# Patient Record
Sex: Male | Born: 1990 | Race: White | Hispanic: No | Marital: Single | Smoking: Current every day smoker
Health system: Southern US, Community
[De-identification: ages and names within clinical notes are randomized; demographics above are authoritative.]

## PROBLEM LIST (undated history)

## (undated) DIAGNOSIS — F410 Panic disorder [episodic paroxysmal anxiety] without agoraphobia: Secondary | ICD-10-CM

## (undated) DIAGNOSIS — F32A Depression, unspecified: Secondary | ICD-10-CM

## (undated) DIAGNOSIS — I10 Essential (primary) hypertension: Secondary | ICD-10-CM

## (undated) DIAGNOSIS — T7840XA Allergy, unspecified, initial encounter: Secondary | ICD-10-CM

## (undated) DIAGNOSIS — K219 Gastro-esophageal reflux disease without esophagitis: Secondary | ICD-10-CM

## (undated) HISTORY — DX: Allergy, unspecified, initial encounter: T78.40XA

## (undated) HISTORY — DX: Gastro-esophageal reflux disease without esophagitis: K21.9

## (undated) HISTORY — PX: FRACTURE SURGERY: SHX138

## (undated) HISTORY — DX: Depression, unspecified: F32.A

---

## 2010-12-29 ENCOUNTER — Emergency Department: Payer: Self-pay | Admitting: Emergency Medicine

## 2011-03-06 ENCOUNTER — Emergency Department: Payer: Self-pay | Admitting: Emergency Medicine

## 2011-04-02 ENCOUNTER — Emergency Department: Payer: Self-pay | Admitting: Emergency Medicine

## 2013-09-09 ENCOUNTER — Emergency Department: Payer: Self-pay | Admitting: Emergency Medicine

## 2013-09-09 LAB — URINALYSIS, COMPLETE
Bacteria: NONE SEEN
Bilirubin,UR: NEGATIVE
Blood: NEGATIVE
Glucose,UR: NEGATIVE mg/dL (ref 0–75)
Ketone: NEGATIVE
Leukocyte Esterase: NEGATIVE
Nitrite: NEGATIVE
Ph: 7 (ref 4.5–8.0)
Protein: NEGATIVE
RBC,UR: 4 /HPF (ref 0–5)
Specific Gravity: 1.014 (ref 1.003–1.030)
Squamous Epithelial: NONE SEEN
WBC UR: 12 /HPF (ref 0–5)

## 2013-09-09 LAB — CBC WITH DIFFERENTIAL/PLATELET
Basophil #: 0.1 10*3/uL (ref 0.0–0.1)
Basophil %: 0.9 %
Eosinophil #: 0.3 10*3/uL (ref 0.0–0.7)
Eosinophil %: 4.4 %
HCT: 43.4 % (ref 40.0–52.0)
HGB: 14.6 g/dL (ref 13.0–18.0)
Lymphocyte #: 2 10*3/uL (ref 1.0–3.6)
Lymphocyte %: 27.8 %
MCH: 28.9 pg (ref 26.0–34.0)
MCHC: 33.7 g/dL (ref 32.0–36.0)
MCV: 86 fL (ref 80–100)
Monocyte #: 1.3 x10 3/mm — ABNORMAL HIGH (ref 0.2–1.0)
Monocyte %: 17.2 %
Neutrophil #: 3.6 10*3/uL (ref 1.4–6.5)
Neutrophil %: 49.7 %
Platelet: 296 10*3/uL (ref 150–440)
RBC: 5.05 10*6/uL (ref 4.40–5.90)
RDW: 13.4 % (ref 11.5–14.5)
WBC: 7.3 10*3/uL (ref 3.8–10.6)

## 2013-09-09 LAB — COMPREHENSIVE METABOLIC PANEL
Albumin: 3.8 g/dL (ref 3.4–5.0)
Alkaline Phosphatase: 115 U/L
Anion Gap: 3 — ABNORMAL LOW (ref 7–16)
BUN: 14 mg/dL (ref 7–18)
Bilirubin,Total: 0.3 mg/dL (ref 0.2–1.0)
Calcium, Total: 9 mg/dL (ref 8.5–10.1)
Chloride: 102 mmol/L (ref 98–107)
Co2: 32 mmol/L (ref 21–32)
Creatinine: 1.06 mg/dL (ref 0.60–1.30)
EGFR (African American): >60
EGFR (Non-African Amer.): >60
Glucose: 101 mg/dL — ABNORMAL HIGH (ref 65–99)
Osmolality: 274 (ref 275–301)
Potassium: 3.9 mmol/L (ref 3.5–5.1)
SGOT(AST): 27 U/L (ref 15–37)
SGPT (ALT): 41 U/L (ref 12–78)
Sodium: 137 mmol/L (ref 136–145)
Total Protein: 7.8 g/dL (ref 6.4–8.2)

## 2013-09-09 LAB — LIPASE, BLOOD: Lipase: 154 U/L (ref 73–393)

## 2013-09-09 LAB — TROPONIN I: Troponin-I: <0.02 ng/mL

## 2014-06-05 ENCOUNTER — Emergency Department: Payer: Self-pay | Admitting: Emergency Medicine

## 2014-06-05 LAB — BASIC METABOLIC PANEL
Anion Gap: 7 (ref 7–16)
BUN: 16 mg/dL (ref 7–18)
Calcium, Total: 8.7 mg/dL (ref 8.5–10.1)
Chloride: 106 mmol/L (ref 98–107)
Co2: 29 mmol/L (ref 21–32)
Creatinine: 1.23 mg/dL (ref 0.60–1.30)
EGFR (African American): >60
EGFR (Non-African Amer.): >60
Glucose: 106 mg/dL — ABNORMAL HIGH (ref 65–99)
Osmolality: 285 (ref 275–301)
Potassium: 3.8 mmol/L (ref 3.5–5.1)
Sodium: 142 mmol/L (ref 136–145)

## 2014-06-05 LAB — CBC
HCT: 42.3 % (ref 40.0–52.0)
HGB: 13.9 g/dL (ref 13.0–18.0)
MCH: 29 pg (ref 26.0–34.0)
MCHC: 32.9 g/dL (ref 32.0–36.0)
MCV: 88 fL (ref 80–100)
Platelet: 269 10*3/uL (ref 150–440)
RBC: 4.81 10*6/uL (ref 4.40–5.90)
RDW: 13.3 % (ref 11.5–14.5)
WBC: 6.6 10*3/uL (ref 3.8–10.6)

## 2014-06-05 LAB — HEPATIC FUNCTION PANEL A (ARMC)
Albumin: 3.8 g/dL (ref 3.4–5.0)
Alkaline Phosphatase: 94 U/L
Bilirubin, Direct: <0.1 mg/dL (ref 0.0–0.2)
Bilirubin,Total: 0.2 mg/dL (ref 0.2–1.0)
SGOT(AST): 35 U/L (ref 15–37)
SGPT (ALT): 40 U/L
Total Protein: 7.3 g/dL (ref 6.4–8.2)

## 2014-06-05 LAB — TROPONIN I: Troponin-I: <0.02 ng/mL

## 2015-01-31 ENCOUNTER — Emergency Department: Payer: Self-pay

## 2015-01-31 ENCOUNTER — Encounter: Payer: Self-pay | Admitting: *Deleted

## 2015-01-31 ENCOUNTER — Emergency Department
Admission: EM | Admit: 2015-01-31 | Discharge: 2015-01-31 | Disposition: A | Discharge status: Home / Self Care | Payer: Self-pay | Attending: Emergency Medicine | Admitting: Emergency Medicine

## 2015-01-31 ENCOUNTER — Other Ambulatory Visit: Payer: Self-pay

## 2015-01-31 DIAGNOSIS — R42 Dizziness and giddiness: Secondary | ICD-10-CM | POA: Insufficient documentation

## 2015-01-31 DIAGNOSIS — I10 Essential (primary) hypertension: Secondary | ICD-10-CM | POA: Insufficient documentation

## 2015-01-31 DIAGNOSIS — Z720 Tobacco use: Secondary | ICD-10-CM | POA: Insufficient documentation

## 2015-01-31 HISTORY — DX: Essential (primary) hypertension: I10

## 2015-01-31 LAB — BASIC METABOLIC PANEL
Anion gap: 6 (ref 5–15)
BUN: 17 mg/dL (ref 6–20)
CO2: 28 mmol/L (ref 22–32)
Calcium: 8.9 mg/dL (ref 8.9–10.3)
Chloride: 104 mmol/L (ref 101–111)
Creatinine, Ser: 1 mg/dL (ref 0.61–1.24)
GFR calc Af Amer: >60 mL/min (ref >60)
GFR calc non Af Amer: >60 mL/min (ref >60)
Glucose, Bld: 108 mg/dL — ABNORMAL HIGH (ref 65–99)
Potassium: 3.6 mmol/L (ref 3.5–5.1)
Sodium: 138 mmol/L (ref 135–145)

## 2015-01-31 LAB — CBC
HCT: 41.2 % (ref 40.0–52.0)
Hemoglobin: 14 g/dL (ref 13.0–18.0)
MCH: 28.9 pg (ref 26.0–34.0)
MCHC: 33.9 g/dL (ref 32.0–36.0)
MCV: 85.3 fL (ref 80.0–100.0)
Platelets: 296 10*3/uL (ref 150–440)
RBC: 4.83 MIL/uL (ref 4.40–5.90)
RDW: 13.2 % (ref 11.5–14.5)
WBC: 6.6 10*3/uL (ref 3.8–10.6)

## 2015-01-31 LAB — TROPONIN I: Troponin I: <0.03 ng/mL (ref <0.031)

## 2015-01-31 MED ORDER — SODIUM CHLORIDE 0.9 % IV SOLN: 1000.0000 mL | Freq: Once | INTRAVENOUS | Status: DC

## 2015-01-31 NOTE — ED Notes (Addendum)
Patient states he became dizzy approximately two hours ago and left-sided chest pain began one hour ago which he described as a pressure. Patient states he has been diaphoretic today and feels like he is dehydrated. Patient states he has been having dizzy episodes lately without the chest pain.

## 2015-01-31 NOTE — Discharge Instructions (Signed)

## 2015-01-31 NOTE — ED Provider Notes (Signed)
[facility] Emergency Department Provider Note  ____________________________________________  Time seen: 3:45 PM  I have reviewed the triage vital signs and the nursing notes.   HISTORY  Chief Complaint Dizziness   HPI Zachary L Pore Montez Hageman. is a 24 y.o. male who presents with complaints of dizziness. He reports he has been working outside in the heat very high acuity over the last 2 weeks. Today he felt significantly dizzy and dehydrated. On his way to the emergency department he felt a little bit of tightness in his chest. But is now resolved. No shortness of breath. No fevers no chills. No muscle aches. He feels well while lying in bed     Past Medical History  Diagnosis Date  . Hypertension     There are no active problems to display for this patient.   History reviewed. No pertinent past surgical history.  No current outpatient prescriptions on file.  Allergies Review of patient's allergies indicates no known allergies.  No family history on file.  Social History Social History  Substance Use Topics  . Smoking status: Current Every Day Smoker  . Smokeless tobacco: None  . Alcohol Use: Yes     Comment: occasionally    Review of Systems  Constitutional: Negative for fever. Eyes: Negative for visual changes. ENT: Negative for sore throat Cardiovascular: Positive for chest tightness Respiratory: Negative for shortness of breath. Gastrointestinal: Negative for abdominal pain, vomiting and diarrhea. Genitourinary: Negative for dysuria. Musculoskeletal: Negative for back pain. Skin: Negative for rash. Neurological: Negative for headaches or focal weakness. Positive for dizziness Psychiatric: No anxiety    ____________________________________________   PHYSICAL EXAM:  VITAL SIGNS: ED Triage Vitals  Enc Vitals Group     BP 01/31/15 1530 141/88 mmHg     Pulse Rate 01/31/15 1530 91     Resp 01/31/15 1530 18     Temp 01/31/15 1530  98 F (36.7 C)     Temp Source 01/31/15 1530 Oral     SpO2 01/31/15 1530 99 %     Weight 01/31/15 1530 283 lb (128.368 kg)     Height 01/31/15 1530  (1.803 m)     Head Cir --      Peak Flow --      Pain Score 01/31/15 1533 2     Pain Loc --      Pain Edu? --      Excl. in GC? --      Constitutional: Alert and oriented. Well appearing and in no distress. Eyes: Conjunctivae are normal.  ENT   Head: Normocephalic and atraumatic.   Mouth/Throat: Mucous membranes are moist. Cardiovascular: Normal rate, regular rhythm. Normal and symmetric distal pulses are present in all extremities. No murmurs, rubs, or gallops. Respiratory: Normal respiratory effort without tachypnea nor retractions. Breath sounds are clear and equal bilaterally.  Gastrointestinal: Soft and non-tender in all quadrants. No distention. There is no CVA tenderness. Genitourinary: deferred Musculoskeletal: Nontender with normal range of motion in all extremities. No lower extremity tenderness nor edema. Neurologic:  Normal speech and language. No gross focal neurologic deficits are appreciated. Skin:  Skin is warm, dry and intact. No rash noted. Psychiatric: Mood and affect are normal. Patient exhibits appropriate insight and judgment.  ____________________________________________    LABS (pertinent positives/negatives)  Labs Reviewed  BASIC METABOLIC PANEL - Abnormal; Notable for the following:    Glucose, Bld 108 (*)    All other components within normal limits  CBC  TROPONIN I  ____________________________________________   EKG  ED ECG REPORT I, Jene Every, the attending physician, personally viewed and interpreted this ECG.  Date: 01/31/2015 EKG Time: 3:29 PM Rate: 86 Rhythm: normal sinus rhythm QRS Axis: normal Intervals: normal ST/T Wave abnormalities: normal Conduction Disutrbances: none Narrative Interpretation:  unremarkable   ____________________________________________    RADIOLOGY I have personally reviewed any xrays that were ordered on this patient: Chest x-ray unremarkable  ____________________________________________   PROCEDURES  Procedure(s) performed: none  Critical Care performed: none  ____________________________________________   INITIAL IMPRESSION / ASSESSMENT AND PLAN / ED COURSE  Pertinent labs & imaging results that were available during my care of the patient were reviewed by me and considered in my medical decision making (see chart for details).  Patient overall well-appearing. He has no chest pain in the emergency department. His EKG and his troponin are normal. His labs unremarkable. Clinically I think he is suffering from heat exhaustion. We'll give him a liter of fluid in the emergency department and see how he feels  After fluids patient reports he no longer has any dizziness at all. He is able to ambulate around the room without difficulty. His labs are unremarkable. We will discharge him with PCP follow-up and encouraged hydration  ____________________________________________   FINAL CLINICAL IMPRESSION(S) / ED DIAGNOSES  Final diagnoses:  Dizziness     Jene Every, MD 01/31/15 [phone]

## 2015-02-09 ENCOUNTER — Encounter: Payer: Self-pay | Admitting: *Deleted

## 2015-02-09 ENCOUNTER — Emergency Department
Admission: EM | Admit: 2015-02-09 | Discharge: 2015-02-09 | Disposition: A | Discharge status: Home / Self Care | Payer: No Typology Code available for payment source | Attending: Emergency Medicine | Admitting: Emergency Medicine

## 2015-02-09 DIAGNOSIS — L509 Urticaria, unspecified: Secondary | ICD-10-CM | POA: Insufficient documentation

## 2015-02-09 DIAGNOSIS — R21 Rash and other nonspecific skin eruption: Secondary | ICD-10-CM | POA: Diagnosis present

## 2015-02-09 DIAGNOSIS — I10 Essential (primary) hypertension: Secondary | ICD-10-CM | POA: Insufficient documentation

## 2015-02-09 DIAGNOSIS — Z720 Tobacco use: Secondary | ICD-10-CM | POA: Insufficient documentation

## 2015-02-09 MED ORDER — PREDNISONE 10 MG PO TABS: 10.0000 mg | ORAL_TABLET | Freq: Two times a day (BID) | ORAL | Status: DC

## 2015-02-09 MED ORDER — CYPROHEPTADINE HCL 4 MG PO TABS: 4.0000 mg | ORAL_TABLET | Freq: Three times a day (TID) | ORAL | Status: DC | PRN

## 2015-02-09 MED ORDER — PREDNISONE 20 MG PO TABS
60.0000 mg | ORAL_TABLET | Freq: Once | ORAL | Status: AC
  Administered 2015-02-09: 60 mg via ORAL
  Filled 2015-02-09: qty 3

## 2015-02-09 MED ORDER — FAMOTIDINE 20 MG PO TABS
40.0000 mg | ORAL_TABLET | Freq: Once | ORAL | Status: AC
  Administered 2015-02-09: 40 mg via ORAL
  Filled 2015-02-09: qty 2

## 2015-02-09 MED ORDER — RANITIDINE HCL 150 MG PO TABS: 150.0000 mg | ORAL_TABLET | Freq: Two times a day (BID) | ORAL | Status: DC

## 2015-02-09 NOTE — ED Notes (Signed)
Pt reports hives on bilateral arms , chest and legs, pt reports wife switched to [employer] detergent

## 2015-02-09 NOTE — ED Provider Notes (Signed)
[facility] Emergency Department Provider Note ____________________________________________  Time seen: 1320  I have reviewed the triage vital signs and the nursing notes.  HISTORY  Chief Complaint  Urticaria  HPI Zachary L Diiorio Montez Hageman. is a 24 y.o. male reports to the ED for generalized itchy rash of unknown cause. He describes he took Benadryl last night with some short-term relief. He is on clear the cause of the rash itself. He describes no change in his oral intake, no exposuresto any allergens or triggers. He denies any history of sensitive skin or food or environmental allergies. He is denying any respiratory symptoms, mucous membrane swelling, or difficulty breathing.  Past Medical History  Diagnosis Date  . Hypertension    There are no active problems to display for this patient.  History reviewed. No pertinent past surgical history.  Current Outpatient Rx  Name  Route  Sig  Dispense  Refill  . cyproheptadine (PERIACTIN) 4 MG tablet   Oral   Take 1 tablet (4 mg total) by mouth 3 (three) times daily as needed for allergies.   21 tablet   0   . predniSONE (DELTASONE) 10 MG tablet   Oral   Take 1 tablet (10 mg total) by mouth 2 (two) times daily with a meal.   10 tablet   0   . ranitidine (ZANTAC) 150 MG tablet   Oral   Take 1 tablet (150 mg total) by mouth 2 (two) times daily.   20 tablet   0     Allergies Review of patient's allergies indicates no known allergies.  No family history on file.  Social History Social History  Substance Use Topics  . Smoking status: Current Every Day Smoker -- 1.00 packs/day    Types: Cigarettes  . Smokeless tobacco: None  . Alcohol Use: Yes     Comment: occasionally   Review of Systems  Constitutional: Negative for fever. Eyes: Negative for visual changes. ENT: Negative for sore throat. Cardiovascular: Negative for chest pain. Respiratory: Negative for shortness of breath. Gastrointestinal:  Negative for abdominal pain, vomiting and diarrhea. Genitourinary: Negative for dysuria. Musculoskeletal: Negative for back pain. Skin: Positive for rash. Neurological: Negative for headaches, focal weakness or numbness. ____________________________________________  PHYSICAL EXAM:  VITAL SIGNS: ED Triage Vitals  Enc Vitals Group     BP 02/09/15 1256 148/92 mmHg     Pulse Rate 02/09/15 1256 90     Resp 02/09/15 1256 20     Temp 02/09/15 1256 97.9 F (36.6 C)     Temp Source 02/09/15 1256 Oral     SpO2 02/09/15 1256 97 %     Weight 02/09/15 1256 230 lb (104.327 kg)     Height 02/09/15 1256  (1.803 m)     Head Cir --      Peak Flow --      Pain Score --      Pain Loc --      Pain Edu? --      Excl. in GC? --    Constitutional: Alert and oriented. Well appearing and in no distress. Eyes: Conjunctivae are normal. PERRL. Normal extraocular movements. ENT   Head: Normocephalic and atraumatic.   Nose: No congestion/rhinnorhea.   Mouth/Throat: Mucous membranes are moist. Uvula midline without evidence of angioedema.  Neck: Supple. No thyromegaly. Hematological/Lymphatic/Immunilogical: No cervical lymphadenopathy. Cardiovascular: Normal rate, regular rhythm.  Respiratory: Normal respiratory effort. No wheezes/rales/rhonchi. Gastrointestinal: Soft and nontender. No distention. Musculoskeletal: Nontender with normal range of motion  in all extremities.  Neurologic:  Normal gait without ataxia. Normal speech and language. No gross focal neurologic deficits are appreciated. Skin:  Skin is warm, dry and intact. Scattered maculopapular eruptions consistent with whelps of unknown cause.  Psychiatric: Mood and affect are normal. Patient exhibits appropriate insight and judgment. ____________________________________________  PROCEDURES  Famotidine 20 mg PO Prednisone 60 mg PO ____________________________________________  INITIAL IMPRESSION / ASSESSMENT AND PLAN / ED  COURSE  Urticaria of unknown etiology. Will treat with histamine blockade with Periactin, ranitidine, and prednisone 10 mg #10. Follow-up with primary provider or return as needed. ____________________________________________  FINAL CLINICAL IMPRESSION(S) / ED DIAGNOSES  Final diagnoses:  Urticaria     Zachary Hoard, PA-C 02/09/15 1411  Minna Antis, MD 02/09/15 [phone]

## 2015-02-09 NOTE — Discharge Instructions (Signed)
Hives Hives are itchy, red, swollen areas of the skin. They can vary in size and location on your body. Hives can come and go for hours or several days (acute hives) or for several weeks (chronic hives). Hives do not spread from person to person (noncontagious). They may get worse with scratching, exercise, and emotional stress. CAUSES   Allergic reaction to food, additives, or drugs.  Infections, including the common cold.  Illness, such as vasculitis, lupus, or thyroid disease.  Exposure to sunlight, heat, or cold.  Exercise.  Stress.  Contact with chemicals. SYMPTOMS   Red or white swollen patches on the skin. The patches may change size, shape, and location quickly and repeatedly.  Itching.  Swelling of the hands, feet, and face. This may occur if hives develop deeper in the skin. DIAGNOSIS  Your caregiver can usually tell what is wrong by performing a physical exam. Skin or blood tests may also be done to determine the cause of your hives. In some cases, the cause cannot be determined. TREATMENT  Mild cases usually get better with medicines such as antihistamines. Severe cases may require an emergency epinephrine injection. If the cause of your hives is known, treatment includes avoiding that trigger.  HOME CARE INSTRUCTIONS   Avoid causes that trigger your hives.  Take antihistamines as directed by your caregiver to reduce the severity of your hives. Non-sedating or low-sedating antihistamines are usually recommended. Do not drive while taking an antihistamine.  Take any other medicines prescribed for itching as directed by your caregiver.  Wear loose-fitting clothing.  Keep all follow-up appointments as directed by your caregiver. SEEK MEDICAL CARE IF:   You have persistent or severe itching that is not relieved with medicine.  You have painful or swollen joints. SEEK IMMEDIATE MEDICAL CARE IF:   You have a fever.  Your tongue or lips are swollen.  You have  trouble breathing or swallowing.  You feel tightness in the throat or chest.  You have abdominal pain. These problems may be the first sign of a life-threatening allergic reaction. Call your local emergency services (911 in [place] MAKE SURE YOU:   Understand these instructions.  Will watch your condition.  Will get help right away if you are not doing well or get worse. Document Released: 05/31/2005 Document Revised: 06/05/2013 Document Reviewed: 08/24/2011 [facility] Patient Information 2015 [place], [place]. This information is not intended to replace advice given to you by your health care provider. Make sure you discuss any questions you have with your health care provider.  You are having an allergic reaction to an unknown cause. Take the prescription meds as directed. Follow-up with your provider, [facility], or return to the ED as needed.

## 2015-02-09 NOTE — ED Notes (Signed)
Here for generalized rash, pt took benadryl last PM.  Unknown cause of rash.

## 2016-01-13 ENCOUNTER — Emergency Department: Payer: No Typology Code available for payment source

## 2016-01-13 ENCOUNTER — Emergency Department
Admission: EM | Admit: 2016-01-13 | Discharge: 2016-01-13 | Disposition: A | Discharge status: Home / Self Care | Payer: No Typology Code available for payment source | Attending: Emergency Medicine | Admitting: Emergency Medicine

## 2016-01-13 ENCOUNTER — Encounter: Payer: Self-pay | Admitting: Emergency Medicine

## 2016-01-13 DIAGNOSIS — F17210 Nicotine dependence, cigarettes, uncomplicated: Secondary | ICD-10-CM | POA: Diagnosis not present

## 2016-01-13 DIAGNOSIS — R1011 Right upper quadrant pain: Secondary | ICD-10-CM | POA: Diagnosis present

## 2016-01-13 DIAGNOSIS — R112 Nausea with vomiting, unspecified: Secondary | ICD-10-CM | POA: Diagnosis not present

## 2016-01-13 DIAGNOSIS — R1013 Epigastric pain: Secondary | ICD-10-CM | POA: Diagnosis not present

## 2016-01-13 DIAGNOSIS — I10 Essential (primary) hypertension: Secondary | ICD-10-CM | POA: Diagnosis not present

## 2016-01-13 DIAGNOSIS — R197 Diarrhea, unspecified: Secondary | ICD-10-CM | POA: Insufficient documentation

## 2016-01-13 DIAGNOSIS — R109 Unspecified abdominal pain: Secondary | ICD-10-CM

## 2016-01-13 LAB — COMPREHENSIVE METABOLIC PANEL
ALT: 31 U/L (ref 17–63)
AST: 26 U/L (ref 15–41)
Albumin: 4.6 g/dL (ref 3.5–5.0)
Alkaline Phosphatase: 82 U/L (ref 38–126)
Anion gap: 8 (ref 5–15)
BUN: 18 mg/dL (ref 6–20)
CO2: 25 mmol/L (ref 22–32)
Calcium: 9.5 mg/dL (ref 8.9–10.3)
Chloride: 104 mmol/L (ref 101–111)
Creatinine, Ser: 0.94 mg/dL (ref 0.61–1.24)
GFR calc Af Amer: >60 mL/min (ref >60)
GFR calc non Af Amer: >60 mL/min (ref >60)
Glucose, Bld: 112 mg/dL — ABNORMAL HIGH (ref 65–99)
Potassium: 4 mmol/L (ref 3.5–5.1)
Sodium: 137 mmol/L (ref 135–145)
Total Bilirubin: 0.6 mg/dL (ref 0.3–1.2)
Total Protein: 7.7 g/dL (ref 6.5–8.1)

## 2016-01-13 LAB — CBC WITH DIFFERENTIAL/PLATELET
Basophils Absolute: 0 10*3/uL (ref 0–0.1)
Basophils Relative: 0 %
Eosinophils Absolute: 0.3 10*3/uL (ref 0–0.7)
Eosinophils Relative: 3 %
HCT: 44.2 % (ref 40.0–52.0)
Hemoglobin: 15.5 g/dL (ref 13.0–18.0)
Lymphocytes Relative: 12 %
Lymphs Abs: 1.3 10*3/uL (ref 1.0–3.6)
MCH: 29.5 pg (ref 26.0–34.0)
MCHC: 35 g/dL (ref 32.0–36.0)
MCV: 84.4 fL (ref 80.0–100.0)
Monocytes Absolute: 1.2 10*3/uL — ABNORMAL HIGH (ref 0.2–1.0)
Monocytes Relative: 11 %
Neutro Abs: 8.3 10*3/uL — ABNORMAL HIGH (ref 1.4–6.5)
Neutrophils Relative %: 74 %
Platelets: 282 10*3/uL (ref 150–440)
RBC: 5.24 MIL/uL (ref 4.40–5.90)
RDW: 13.3 % (ref 11.5–14.5)
WBC: 11.1 10*3/uL — ABNORMAL HIGH (ref 3.8–10.6)

## 2016-01-13 LAB — URINALYSIS COMPLETE WITH MICROSCOPIC (ARMC ONLY)
Bacteria, UA: NONE SEEN
Bilirubin Urine: NEGATIVE
Glucose, UA: NEGATIVE mg/dL
Hgb urine dipstick: NEGATIVE
Ketones, ur: NEGATIVE mg/dL
Leukocytes, UA: NEGATIVE
Nitrite: NEGATIVE
Protein, ur: NEGATIVE mg/dL
RBC / HPF: NONE SEEN RBC/hpf (ref 0–5)
Specific Gravity, Urine: 1.02 (ref 1.005–1.030)
Squamous Epithelial / LPF: NONE SEEN
pH: 5 (ref 5.0–8.0)

## 2016-01-13 LAB — LIPASE, BLOOD: Lipase: 38 U/L (ref 11–51)

## 2016-01-13 MED ORDER — HYDROMORPHONE HCL 1 MG/ML IJ SOLN
INTRAMUSCULAR | Status: AC
  Filled 2016-01-13: qty 1

## 2016-01-13 MED ORDER — GI COCKTAIL ~~LOC~~
ORAL | Status: AC
  Administered 2016-01-13: 30 mL via ORAL
  Filled 2016-01-13: qty 30

## 2016-01-13 MED ORDER — GI COCKTAIL ~~LOC~~
30.0000 mL | Freq: Once | ORAL | Status: AC
  Administered 2016-01-13: 30 mL via ORAL

## 2016-01-13 MED ORDER — DIATRIZOATE MEGLUMINE & SODIUM 66-10 % PO SOLN
15.0000 mL | Freq: Once | ORAL | Status: AC
  Administered 2016-01-13: 15 mL via ORAL

## 2016-01-13 MED ORDER — ONDANSETRON HCL 4 MG/2ML IJ SOLN
4.0000 mg | Freq: Once | INTRAMUSCULAR | Status: AC
  Administered 2016-01-13: 4 mg via INTRAVENOUS
  Filled 2016-01-13: qty 2

## 2016-01-13 MED ORDER — SODIUM CHLORIDE 0.9 % IV BOLUS (SEPSIS)
1000.0000 mL | Freq: Once | INTRAVENOUS | Status: AC
  Administered 2016-01-13: 1000 mL via INTRAVENOUS

## 2016-01-13 MED ORDER — IOPAMIDOL (ISOVUE-300) INJECTION 61%
100.0000 mL | Freq: Once | INTRAVENOUS | Status: AC | PRN
Start: 2016-01-13 — End: 2016-01-13
  Administered 2016-01-13: 100 mL via INTRAVENOUS

## 2016-01-13 MED ORDER — DICYCLOMINE HCL 10 MG PO CAPS
20.0000 mg | ORAL_CAPSULE | Freq: Once | ORAL | Status: AC
  Administered 2016-01-13: 20 mg via ORAL
  Filled 2016-01-13: qty 2

## 2016-01-13 MED ORDER — FAMOTIDINE 20 MG PO TABS
40.0000 mg | ORAL_TABLET | Freq: Once | ORAL | Status: AC
Start: 2016-01-13 — End: 2016-01-13
  Administered 2016-01-13: 40 mg via ORAL
  Filled 2016-01-13: qty 2

## 2016-01-13 MED ORDER — DICYCLOMINE HCL 20 MG PO TABS: 20.0000 mg | ORAL_TABLET | Freq: Three times a day (TID) | ORAL | 0 refills | Status: DC | PRN

## 2016-01-13 MED ORDER — HYDROCODONE-ACETAMINOPHEN 5-325 MG PO TABS: 1.0000 | ORAL_TABLET | ORAL | 0 refills | Status: DC | PRN

## 2016-01-13 MED ORDER — HYDROMORPHONE HCL 1 MG/ML IJ SOLN
1.0000 mg | Freq: Once | INTRAMUSCULAR | Status: AC
  Administered 2016-01-13: 1 mg via INTRAVENOUS

## 2016-01-13 MED ORDER — LISINOPRIL 2.5 MG PO TABS: 2.5000 mg | ORAL_TABLET | Freq: Every day | ORAL | 0 refills | Status: DC

## 2016-01-13 MED ORDER — HYDROMORPHONE HCL 1 MG/ML IJ SOLN
1.0000 mg | Freq: Once | INTRAMUSCULAR | Status: AC
  Administered 2016-01-13: 1 mg via INTRAVENOUS
  Filled 2016-01-13: qty 1

## 2016-01-13 MED ORDER — RANITIDINE HCL 150 MG PO TABS: 150.0000 mg | ORAL_TABLET | Freq: Two times a day (BID) | ORAL | 0 refills | Status: DC

## 2016-01-13 NOTE — ED Notes (Signed)
Pt transported to Ultrasound.  

## 2016-01-13 NOTE — ED Triage Notes (Signed)
Reports abd pain x 3 days, diarrhea today

## 2016-01-13 NOTE — ED Notes (Signed)
Pt returned from [place], pain 10/10. MD made aware.

## 2016-01-13 NOTE — ED Provider Notes (Signed)
[facility] Emergency Department Provider Note  ___________________________________________   None    (approximate)  I have reviewed the triage vital signs and the nursing notes.   HISTORY  Chief Complaint Abdominal Pain  HPI Zachary Mendoza. is a 25 y.o. male who is complaining of supraumbilical and right upper quadrant abdominal pain that has been going on for the past 3 days. Patient states he has also been having vomiting after eating and a small amount of diarrhea. Patient states that the pain is a crescendo decrescendo type pain, but does not appear to change or get worse with eating.patient states his pain on a scale of 0-10 is a 10 at its peak,but again it keeps coming and going," crescendo decrescendo" type pattern every few minutes.    Past Medical History:  Diagnosis Date  . Hypertension     There are no active problems to display for this patient.   History reviewed. No pertinent surgical history.  Prior to Admission medications   Medication Sig Start Date End Date Taking? Authorizing Provider  fenofibrate 54 MG tablet Take 1 tablet by mouth daily. 11/17/15  Yes Historical Provider, MD  dicyclomine (BENTYL) 20 MG tablet Take 1 tablet (20 mg total) by mouth 3 (three) times daily as needed for spasms. 01/13/16   Leona Carry, MD  HYDROcodone-acetaminophen (NORCO/VICODIN) 5-325 MG tablet Take 1 tablet by mouth every 4 (four) hours as needed for moderate pain. 01/13/16 01/12/17  Leona Carry, MD  lisinopril (ZESTRIL) 2.5 MG tablet Take 1 tablet (2.5 mg total) by mouth daily. 01/13/16 01/12/17  Leona Carry, MD  ranitidine (ZANTAC) 150 MG tablet Take 1 tablet (150 mg total) by mouth 2 (two) times daily. 01/13/16 01/12/17  Leona Carry, MD    Allergies Review of patient's allergies indicates no known allergies.  No family history on file.  Social History Social History  Substance Use Topics  . Smoking status: Current Every Day Smoker   Packs/day: 1.00    Types: Cigarettes  . Smokeless tobacco: Never Used  . Alcohol use Yes     Comment: occasionally    Review of Systems Constitutional: No fever/chills Eyes: No visual changes. ENT: No sore throat. Cardiovascular: Denies chest pain. Respiratory: Denies shortness of breath cough or congestion. Gastrointestinal: positive for abdominal pain. Positive for nausea and vomiting.  Positive for diarrhea.  No constipation. Genitourinary: Negative for dysuria, frequency, or hematuria. Musculoskeletal: Negative for back pain. pain does not seem to radiate to the back. Skin: Negative for rash. Neurological: Negative for headaches, focal weakness or numbness. 10-point ROS otherwise negative.  ____________________________________________   PHYSICAL EXAM:  VITAL SIGNS: ED Triage Vitals [01/13/16 0804]  Enc Vitals Group     BP (!) 139/91     Pulse Rate 89     Resp 20     Temp 98 F (36.7 C)     Temp Source Oral     SpO2 99 %     Weight 269 lb (122 kg)     Height 6' (1.829 m)     Head Circumference      Peak Flow      Pain Score 10     Pain Loc      Pain Edu?      Excl. in GC?     Constitutional: Alert and oriented. Well appearing and in moderate acute distress, secondary to his pain. Eyes: Conjunctivae are normal. PERRL. EOMI. Head: Atraumatic. Nose: No congestion/rhinnorhea. Mouth/Throat:  Mucous membranes are moist.  Oropharynx non-erythematous. Neck: No stridor.   Cardiovascular: Normal rate, regular rhythm. Grossly normal heart sounds.  Good peripheral circulation. Respiratory: Normal respiratory effort.  No retractions. Lungs CTAB. Gastrointestinal: Soft and tender to palpation in his epigastric and right upper quadrant region, with positive Murphy sign. No distention. No abdominal bruits. No CVA tenderness. Musculoskeletal: No lower extremity tenderness nor edema.  No joint effusions. Neurologic:  Normal speech and language. No gross focal neurologic  deficits are appreciated. No gait instability. Skin:  Skin is warm, dry and intact. No rash noted. Psychiatric: Mood and affect are normal. Speech and behavior are normal.  ____________________________________________   LABS (all labs ordered are listed, but only abnormal results are displayed)  Labs Reviewed  CBC WITH DIFFERENTIAL/PLATELET - Abnormal; Notable for the following:       Result Value   WBC 11.1 (*)    Neutro Abs 8.3 (*)    Monocytes Absolute 1.2 (*)    All other components within normal limits  COMPREHENSIVE METABOLIC PANEL - Abnormal; Notable for the following:    Glucose, Bld 112 (*)    All other components within normal limits  URINALYSIS COMPLETEWITH MICROSCOPIC ([facility] ONLY) - Abnormal; Notable for the following:    Color, Urine YELLOW (*)    APPearance CLEAR (*)    All other components within normal limits  LIPASE, BLOOD   ____________________________________________  EKG  ED ECG REPORT I, Leona Carry, the attending physician, personally viewed and interpreted this ECG.   Date: 01/13/2016  EKG Time: 0822  Rate: 74  Rhythm: normal EKG, normal sinus rhythm, unchanged from previous tracings  Axis: Normal  Intervals:none  ST&T Change: None  ____________________________________________  RADIOLOGY  Ct Abdomen Pelvis W Contrast  Result Date: 01/13/2016 CLINICAL DATA:  Three-day history of epigastric region pain with nausea and vomiting EXAM: CT ABDOMEN AND PELVIS WITH CONTRAST TECHNIQUE: Multidetector CT imaging of the abdomen and pelvis was performed using the standard protocol following bolus administration of intravenous contrast. CONTRAST:  ISOVUE-300 IOPAMIDOL (ISOVUE-300) INJECTION 61% COMPARISON:  None. FINDINGS: Lower chest: There is patchy bibasilar atelectasis posteriorly. No lung base edema or consolidation evident. Hepatobiliary: No focal liver lesions are evident. Gallbladder wall is not appreciably thickened. There is no biliary duct  dilatation. Pancreas: There is no pancreatic mass or inflammatory focus. Spleen: No splenic lesions are evident. There is a small splenule anterior to the spleen. Adrenals/Urinary Tract: Adrenals appear normal bilaterally. Kidneys bilaterally show no mass or hydronephrosis on either side. There is no renal or ureteral calculus on either side. Urinary bladder is midline with wall thickness within normal limits. Stomach/Bowel: There are multiple sigmoid diverticula without diverticulitis evident. Scattered colonic diverticula elsewhere noted without diverticulitis. There is no bowel wall or mesenteric thickening. There is no appreciable bowel obstruction. No free air or portal venous air. Vascular/Lymphatic: There is no abdominal aortic aneurysm. No vascular lesions are evident. There is no adenopathy in the abdomen or pelvis. Reproductive: Prostate and seminal vesicles appear within normal limits. There is no pelvic mass or pelvic fluid collection. Other: Appendix appears unremarkable. There is no ascites or abscess in the abdomen or pelvis. There is fat in each inguinal ring. Musculoskeletal: There are no blastic or lytic bone lesions. There is no intramuscular or abdominal wall lesion. IMPRESSION: Colonic diverticulosis without diverticulitis. No bowel wall thickening. No bowel obstruction. No abscess. Appendix appears normal. No renal or ureteral calculus.  No hydronephrosis. Electronically Signed   By: Chrissie Noa  Margarita Grizzle III M.D.   On: 01/13/2016 11:22   US Abdomen Limited Ruq  Result Date: 01/13/2016 CLINICAL DATA:  Acute right upper quadrant abdominal pain. EXAM: US ABDOMEN LIMITED - RIGHT UPPER QUADRANT COMPARISON:  None. FINDINGS: Gallbladder: No gallstones or wall thickening visualized. No sonographic Murphy sign noted by sonographer. Common bile duct: Diameter: 2.5 mm which is within normal limits. Liver: No focal lesion identified. Increased echogenicity is noted consistent with fatty infiltration.  IMPRESSION: Probable fatty infiltration of the liver. No other abnormality seen in the right upper quadrant of the abdomen. Electronically Signed   By: Lupita Raider, M.D.   On: 01/13/2016 09:31   _____________________________________   PROCEDURES  Procedure(s) performed: None  Procedures  Critical Care performed: No  ____________________________________________   INITIAL IMPRESSION / ASSESSMENT AND PLAN / ED COURSE  Pertinent labs & imaging results that were available during my care of the patient were reviewed by me and considered in my medical decision making (see chart for details).  1:38 PM Patient will be started on IV fluids along with given some IV pain and nausea medicines while awaiting labs and right upper quadrant ultrasound to rule out cholecystitis.  1:38 PM Patient's ultrasound the gallbladder was negative. Patient will get a CT abdomen pelvis to rule out causes for his abdominal pain.  1:38 PM Patient continued to have severe abdominal pain intermittent throughout the time that revealing his CT abdomen pelvis. Patient required multiple medications for pain. We finally decided causes pain was very spasmodic, to try Bentyl and Pepcid combination. That finally helped to relieve his pain. We are going to send him home on Bentyl and zantac along with a few hydrocodone to take if the pain gets worse. Patient will be referred to Dr. Servando Snare for further workup from GI. Patient also was given a prescription for his blood pressure medicines secondary to he had run out of his medications. Patient has an appointment with his primary care M.D. next week.  Clinical Course  Value Comment By Time  MCV: 84.4 (Reviewed) Leona Carry, MD 08/01 1132     ____________________________________________   FINAL CLINICAL IMPRESSION(S) / ED DIAGNOSES  Final diagnoses:  Acute abdominal pain      NEW MEDICATIONS STARTED DURING THIS VISIT:  New Prescriptions   DICYCLOMINE (BENTYL)  20 MG TABLET    Take 1 tablet (20 mg total) by mouth 3 (three) times daily as needed for spasms.   HYDROCODONE-ACETAMINOPHEN (NORCO/VICODIN) 5-325 MG TABLET    Take 1 tablet by mouth every 4 (four) hours as needed for moderate pain.   LISINOPRIL (ZESTRIL) 2.5 MG TABLET    Take 1 tablet (2.5 mg total) by mouth daily.   RANITIDINE (ZANTAC) 150 MG TABLET    Take 1 tablet (150 mg total) by mouth 2 (two) times daily.     Note:  This document was prepared using Dragon voice recognition software and may include unintentional dictation errors.    Leona Carry, MD 01/13/16 [id number]

## 2016-02-22 ENCOUNTER — Emergency Department: Payer: No Typology Code available for payment source

## 2016-02-22 ENCOUNTER — Emergency Department
Admission: EM | Admit: 2016-02-22 | Discharge: 2016-02-22 | Disposition: A | Discharge status: Home / Self Care | Payer: No Typology Code available for payment source | Attending: Emergency Medicine | Admitting: Emergency Medicine

## 2016-02-22 ENCOUNTER — Encounter: Payer: Self-pay | Admitting: Emergency Medicine

## 2016-02-22 DIAGNOSIS — F419 Anxiety disorder, unspecified: Secondary | ICD-10-CM | POA: Insufficient documentation

## 2016-02-22 DIAGNOSIS — R0789 Other chest pain: Secondary | ICD-10-CM | POA: Insufficient documentation

## 2016-02-22 DIAGNOSIS — K2970 Gastritis, unspecified, without bleeding: Secondary | ICD-10-CM | POA: Diagnosis not present

## 2016-02-22 DIAGNOSIS — I10 Essential (primary) hypertension: Secondary | ICD-10-CM | POA: Insufficient documentation

## 2016-02-22 DIAGNOSIS — F17210 Nicotine dependence, cigarettes, uncomplicated: Secondary | ICD-10-CM | POA: Insufficient documentation

## 2016-02-22 LAB — CBC
HCT: 45.6 % (ref 40.0–52.0)
Hemoglobin: 15.7 g/dL (ref 13.0–18.0)
MCH: 29.6 pg (ref 26.0–34.0)
MCHC: 34.5 g/dL (ref 32.0–36.0)
MCV: 85.7 fL (ref 80.0–100.0)
Platelets: 288 10*3/uL (ref 150–440)
RBC: 5.31 MIL/uL (ref 4.40–5.90)
RDW: 13.4 % (ref 11.5–14.5)
WBC: 8.7 10*3/uL (ref 3.8–10.6)

## 2016-02-22 LAB — BASIC METABOLIC PANEL
Anion gap: 10 (ref 5–15)
BUN: 15 mg/dL (ref 6–20)
CO2: 25 mmol/L (ref 22–32)
Calcium: 9.5 mg/dL (ref 8.9–10.3)
Chloride: 102 mmol/L (ref 101–111)
Creatinine, Ser: 0.82 mg/dL (ref 0.61–1.24)
GFR calc Af Amer: >60 mL/min (ref >60)
GFR calc non Af Amer: >60 mL/min (ref >60)
Glucose, Bld: 126 mg/dL — ABNORMAL HIGH (ref 65–99)
Potassium: 3.7 mmol/L (ref 3.5–5.1)
Sodium: 137 mmol/L (ref 135–145)

## 2016-02-22 LAB — TROPONIN I: Troponin I: <0.03 ng/mL (ref <0.03)

## 2016-02-22 MED ORDER — SUCRALFATE 1 G PO TABS: 1.0000 g | ORAL_TABLET | Freq: Four times a day (QID) | ORAL | 1 refills | Status: DC

## 2016-02-22 MED ORDER — GI COCKTAIL ~~LOC~~
ORAL | Status: AC
  Filled 2016-02-22: qty 30

## 2016-02-22 MED ORDER — GI COCKTAIL ~~LOC~~
30.0000 mL | Freq: Once | ORAL | Status: AC
  Administered 2016-02-22: 30 mL via ORAL

## 2016-02-22 NOTE — ED Notes (Signed)
Pt c/o back pain this AM followed by multiple panic attacks. Pt hx of anxiety. Pt reports CP with panic attacks and fast HR. Pt ambulatory back to room, no CP at this time. Face reddened.

## 2016-02-22 NOTE — ED Provider Notes (Signed)
[facility] Emergency Department Provider Note   ____________________________________________    I have reviewed the triage vital signs and the nursing notes.   HISTORY  Chief Complaint Chest Pain and Panic Attack     HPI Zachary L Mane Montez Hageman. is a 25 y.o. male who presents with complaints of anxiety, chest tightness and heart palpitations. Patient reports he became very anxious today and felt like he is having a panic attack with tightening his chest and palpitations. He reports this has improved and he feels no chest pain currently. He does complain of pain from his "stomach ulcers" which he has had for some time but has not followed up with gastroenterology. He reports typically at night when he lies down for bed he has burning in his epigastrium.   Past Medical History:  Diagnosis Date  . Hypertension     There are no active problems to display for this patient.   History reviewed. No pertinent surgical history.  Prior to Admission medications   Medication Sig Start Date End Date Taking? Authorizing Provider  dicyclomine (BENTYL) 20 MG tablet Take 1 tablet (20 mg total) by mouth 3 (three) times daily as needed for spasms. 01/13/16   Zachary Carry, MD  fenofibrate 54 MG tablet Take 1 tablet by mouth daily. 11/17/15   Historical Provider, MD  HYDROcodone-acetaminophen (NORCO/VICODIN) 5-325 MG tablet Take 1 tablet by mouth Mendoza 4 (four) hours as needed for moderate pain. 01/13/16 01/12/17  Zachary Carry, MD  lisinopril (ZESTRIL) 2.5 MG tablet Take 1 tablet (2.5 mg total) by mouth daily. 01/13/16 01/12/17  Zachary Carry, MD  ranitidine (ZANTAC) 150 MG tablet Take 1 tablet (150 mg total) by mouth 2 (two) times daily. 01/13/16 01/12/17  Zachary Carry, MD  sucralfate (CARAFATE) 1 g tablet Take 1 tablet (1 g total) by mouth 4 (four) times daily. 02/22/16 02/21/17  Zachary Every, MD     Allergies Review of patient's allergies indicates no known allergies.  History  reviewed. No pertinent family history.  Social History Social History  Substance Use Topics  . Smoking status: Current Mendoza Day Smoker    Packs/day: 1.00    Types: Cigarettes  . Smokeless tobacco: Never Used  . Alcohol use Yes     Comment: occasionally    Review of Systems  Constitutional: No fever/chills Eyes: No visual changes.   Cardiovascular:As above Respiratory: Denies shortness of breath. Gastrointestinal:   No nausea, no vomiting.   Genitourinary: Negative for dysuria. Musculoskeletal: Negative for back pain. Skin: Negative for rash. Neurological: Negative for headaches or weakness  10-point ROS otherwise negative.  ____________________________________________   PHYSICAL EXAM:  VITAL SIGNS: ED Triage Vitals  Enc Vitals Group     BP 02/22/16 1610 136/82     Pulse Rate 02/22/16 1610 (!) 102     Resp 02/22/16 1610 20     Temp 02/22/16 1610 98.6 F (37 C)     Temp Source 02/22/16 1610 Oral     SpO2 02/22/16 1610 97 %     Weight 02/22/16 1613 265 lb (120.2 kg)     Height 02/22/16 1613 5\' 11"  (1.803 m)     Head Circumference --      Peak Flow --      Pain Score 02/22/16 1613 7     Pain Loc --      Pain Edu? --      Excl. in GC? --     Constitutional: Alert and  oriented. No acute distress. Pleasant and interactive Eyes: Conjunctivae are normal.  Head: Atraumatic. Nose: No congestion/rhinnorhea. Mouth/Throat: Mucous membranes are moist.   Neck:  Painless ROM Cardiovascular: Normal rate, regular rhythm. Grossly normal heart sounds.  Good peripheral circulation. Respiratory: Normal respiratory effort.  No retractions. Lungs CTAB. Gastrointestinal: Soft and nontender.No tenderness over the epigastrium. No distention.  No CVA tenderness. Genitourinary: deferred Musculoskeletal: No lower extremity tenderness nor edema.  Warm and well perfused Neurologic:  Normal speech and language. No gross focal neurologic deficits are appreciated.  Skin:  Skin is warm,  dry and intact. No rash noted. Psychiatric: Mood and affect are normal. Speech and behavior are normal.  ____________________________________________   LABS (all labs ordered are listed, but only abnormal results are displayed)  Labs Reviewed  BASIC METABOLIC PANEL - Abnormal; Notable for the following:       Result Value   Glucose, Bld 126 (*)    All other components within normal limits  CBC  TROPONIN I   ____________________________________________  EKG  ED ECG REPORT I, Zachary EveryKINNER, Zachary Mendoza, the attending physician, personally viewed and interpreted this ECG.  Date: 02/22/2016 EKG Time: 4:01 PM Rate: 100 Rhythm: normal sinus rhythm QRS Axis: normal Intervals: normal ST/T Wave abnormalities: normal Conduction Disturbances: none Narrative Interpretation: unremarkable  ____________________________________________  RADIOLOGY  Chest x-ray unremarkable ____________________________________________   PROCEDURES  Procedure(s) performed: No    Critical Care performed: No ____________________________________________   INITIAL IMPRESSION / ASSESSMENT AND PLAN / ED COURSE  Pertinent labs & imaging results that were available during my care of the patient were reviewed by me and considered in my medical decision making (see chart for details).  Patient presents after likely anxiety attack. He is chest pain-free in the emergency department This is what he thinks occured because he has had before. His lab work and EKG are reassuring. He also complains of continuing gastritis, he is taking antacids. We will treat with GI cocktail and discharge with sucralfate and GI follow-up  Clinical Course   ____________________________________________   FINAL CLINICAL IMPRESSION(S) / ED DIAGNOSES  Final diagnoses:  Atypical chest pain  Anxiety  Gastritis      NEW MEDICATIONS STARTED DURING THIS VISIT:  Discharge Medication List as of 02/22/2016  6:28 PM    START taking  these medications   Details  sucralfate (CARAFATE) 1 g tablet Take 1 tablet (1 g total) by mouth 4 (four) times daily., Starting Sun 02/22/2016, Until Mon 02/21/2017, Print         Note:  This document was prepared using Dragon voice recognition software and may include unintentional dictation errors.    Zachary Everyobert Mackinzie Vuncannon, MD 02/22/16 1900

## 2016-02-22 NOTE — ED Triage Notes (Signed)
Pt presents to ED with c/o chest pain and panic attack. Pt states he has never had a panic attack and has been here recently for stomach problems and was prescribed hydrocodone and a medication for stomach spasms which he is out of now.  Pt c/o back pain as well.

## 2016-02-22 NOTE — ED Notes (Signed)
Patient transported to X-ray 

## 2016-03-01 ENCOUNTER — Emergency Department: Payer: No Typology Code available for payment source

## 2016-03-01 ENCOUNTER — Emergency Department
Admission: EM | Admit: 2016-03-01 | Discharge: 2016-03-01 | Disposition: A | Discharge status: Home / Self Care | Payer: No Typology Code available for payment source | Attending: Emergency Medicine | Admitting: Emergency Medicine

## 2016-03-01 ENCOUNTER — Encounter: Payer: Self-pay | Admitting: *Deleted

## 2016-03-01 DIAGNOSIS — S161XXA Strain of muscle, fascia and tendon at neck level, initial encounter: Secondary | ICD-10-CM | POA: Diagnosis not present

## 2016-03-01 DIAGNOSIS — Y999 Unspecified external cause status: Secondary | ICD-10-CM | POA: Diagnosis not present

## 2016-03-01 DIAGNOSIS — Y92410 Unspecified street and highway as the place of occurrence of the external cause: Secondary | ICD-10-CM | POA: Diagnosis not present

## 2016-03-01 DIAGNOSIS — S199XXA Unspecified injury of neck, initial encounter: Secondary | ICD-10-CM | POA: Diagnosis present

## 2016-03-01 DIAGNOSIS — T148 Other injury of unspecified body region: Secondary | ICD-10-CM | POA: Insufficient documentation

## 2016-03-01 DIAGNOSIS — Z791 Long term (current) use of non-steroidal anti-inflammatories (NSAID): Secondary | ICD-10-CM | POA: Insufficient documentation

## 2016-03-01 DIAGNOSIS — S9032XA Contusion of left foot, initial encounter: Secondary | ICD-10-CM | POA: Diagnosis not present

## 2016-03-01 DIAGNOSIS — S060X0A Concussion without loss of consciousness, initial encounter: Secondary | ICD-10-CM | POA: Diagnosis not present

## 2016-03-01 DIAGNOSIS — I10 Essential (primary) hypertension: Secondary | ICD-10-CM | POA: Insufficient documentation

## 2016-03-01 DIAGNOSIS — F17210 Nicotine dependence, cigarettes, uncomplicated: Secondary | ICD-10-CM | POA: Insufficient documentation

## 2016-03-01 DIAGNOSIS — M79672 Pain in left foot: Secondary | ICD-10-CM

## 2016-03-01 DIAGNOSIS — R110 Nausea: Secondary | ICD-10-CM

## 2016-03-01 DIAGNOSIS — T07XXXA Unspecified multiple injuries, initial encounter: Secondary | ICD-10-CM

## 2016-03-01 DIAGNOSIS — Z79899 Other long term (current) drug therapy: Secondary | ICD-10-CM | POA: Insufficient documentation

## 2016-03-01 DIAGNOSIS — Y9389 Activity, other specified: Secondary | ICD-10-CM | POA: Insufficient documentation

## 2016-03-01 LAB — CBC
HCT: 46.1 % (ref 40.0–52.0)
Hemoglobin: 15.8 g/dL (ref 13.0–18.0)
MCH: 29.4 pg (ref 26.0–34.0)
MCHC: 34.2 g/dL (ref 32.0–36.0)
MCV: 85.9 fL (ref 80.0–100.0)
Platelets: 284 10*3/uL (ref 150–440)
RBC: 5.37 MIL/uL (ref 4.40–5.90)
RDW: 12.9 % (ref 11.5–14.5)
WBC: 10.7 10*3/uL — ABNORMAL HIGH (ref 3.8–10.6)

## 2016-03-01 LAB — BASIC METABOLIC PANEL
Anion gap: 9 (ref 5–15)
BUN: 19 mg/dL (ref 6–20)
CO2: 26 mmol/L (ref 22–32)
Calcium: 9.5 mg/dL (ref 8.9–10.3)
Chloride: 105 mmol/L (ref 101–111)
Creatinine, Ser: 1.02 mg/dL (ref 0.61–1.24)
GFR calc Af Amer: >60 mL/min (ref >60)
GFR calc non Af Amer: >60 mL/min (ref >60)
Glucose, Bld: 119 mg/dL — ABNORMAL HIGH (ref 65–99)
Potassium: 3.9 mmol/L (ref 3.5–5.1)
Sodium: 140 mmol/L (ref 135–145)

## 2016-03-01 MED ORDER — DIAZEPAM 5 MG PO TABS
5.0000 mg | ORAL_TABLET | Freq: Once | ORAL | Status: AC
  Administered 2016-03-01: 5 mg via ORAL

## 2016-03-01 MED ORDER — IBUPROFEN 800 MG PO TABS: 800.0000 mg | ORAL_TABLET | Freq: Three times a day (TID) | ORAL | 0 refills | Status: DC | PRN

## 2016-03-01 MED ORDER — OXYCODONE-ACETAMINOPHEN 5-325 MG PO TABS: 1.0000 | ORAL_TABLET | ORAL | 0 refills | Status: DC | PRN

## 2016-03-01 MED ORDER — ONDANSETRON 4 MG PO TBDP: 4.0000 mg | ORAL_TABLET | Freq: Three times a day (TID) | ORAL | 0 refills | Status: DC | PRN

## 2016-03-01 MED ORDER — SODIUM CHLORIDE 0.9 % IV BOLUS (SEPSIS)
1000.0000 mL | Freq: Once | INTRAVENOUS | Status: AC
  Administered 2016-03-01: 1000 mL via INTRAVENOUS

## 2016-03-01 MED ORDER — OXYCODONE-ACETAMINOPHEN 5-325 MG PO TABS
1.0000 | ORAL_TABLET | Freq: Once | ORAL | Status: AC
Start: 2016-03-01 — End: 2016-03-01
  Administered 2016-03-01: 1 via ORAL

## 2016-03-01 MED ORDER — OXYCODONE-ACETAMINOPHEN 5-325 MG PO TABS
ORAL_TABLET | ORAL | Status: AC
  Administered 2016-03-01: 1 via ORAL
  Filled 2016-03-01: qty 1

## 2016-03-01 MED ORDER — FENTANYL CITRATE (PF) 100 MCG/2ML IJ SOLN
100.0000 ug | Freq: Once | INTRAMUSCULAR | Status: AC
  Administered 2016-03-01: 100 ug via INTRAVENOUS
  Filled 2016-03-01 (×2): qty 2

## 2016-03-01 MED ORDER — DIAZEPAM 5 MG PO TABS
ORAL_TABLET | ORAL | Status: AC
  Administered 2016-03-01: 5 mg via ORAL
  Filled 2016-03-01: qty 1

## 2016-03-01 MED ORDER — ONDANSETRON HCL 4 MG/2ML IJ SOLN
4.0000 mg | Freq: Once | INTRAMUSCULAR | Status: AC
  Administered 2016-03-01: 4 mg via INTRAVENOUS
  Filled 2016-03-01: qty 2

## 2016-03-01 MED ORDER — BACITRACIN-NEOMYCIN-POLYMYXIN 400-5-5000 EX OINT
TOPICAL_OINTMENT | Freq: Once | CUTANEOUS | Status: AC
  Administered 2016-03-01: 1 via TOPICAL
  Filled 2016-03-01: qty 1
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
## 2016-03-01 NOTE — Discharge Instructions (Signed)
For your abrasions, please apply Neosporin and a thick coat 2-3 times daily. Monitor for any signs of infection including redness, swelling, pain, or pus, and seek medical attention if you see any of these symptoms.  You may take Motrin for mild to moderate pain and Percocet for severe pain. Do not drive within 8 hours of taking Percocet.  Return to the emergency department if you develop severe pain, inability to keep down fluids, new numbness tingling or weakness, or any other symptoms concerning to you.
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
## 2016-04-26 ENCOUNTER — Encounter: Payer: Self-pay | Admitting: Emergency Medicine

## 2016-04-26 ENCOUNTER — Emergency Department
Admission: EM | Admit: 2016-04-26 | Discharge: 2016-04-26 | Disposition: A | Discharge status: Home / Self Care | Payer: No Typology Code available for payment source | Attending: Emergency Medicine | Admitting: Emergency Medicine

## 2016-04-26 ENCOUNTER — Emergency Department: Payer: No Typology Code available for payment source

## 2016-04-26 DIAGNOSIS — Z79899 Other long term (current) drug therapy: Secondary | ICD-10-CM | POA: Diagnosis not present

## 2016-04-26 DIAGNOSIS — Y9389 Activity, other specified: Secondary | ICD-10-CM | POA: Insufficient documentation

## 2016-04-26 DIAGNOSIS — S61439A Puncture wound without foreign body of unspecified hand, initial encounter: Secondary | ICD-10-CM

## 2016-04-26 DIAGNOSIS — S61431A Puncture wound without foreign body of right hand, initial encounter: Secondary | ICD-10-CM | POA: Insufficient documentation

## 2016-04-26 DIAGNOSIS — F17210 Nicotine dependence, cigarettes, uncomplicated: Secondary | ICD-10-CM | POA: Diagnosis not present

## 2016-04-26 DIAGNOSIS — Y929 Unspecified place or not applicable: Secondary | ICD-10-CM | POA: Insufficient documentation

## 2016-04-26 DIAGNOSIS — Y999 Unspecified external cause status: Secondary | ICD-10-CM | POA: Insufficient documentation

## 2016-04-26 DIAGNOSIS — S6991XA Unspecified injury of right wrist, hand and finger(s), initial encounter: Secondary | ICD-10-CM | POA: Diagnosis present

## 2016-04-26 DIAGNOSIS — I10 Essential (primary) hypertension: Secondary | ICD-10-CM | POA: Insufficient documentation

## 2016-04-26 DIAGNOSIS — W450XXA Nail entering through skin, initial encounter: Secondary | ICD-10-CM | POA: Diagnosis not present

## 2016-04-26 MED ORDER — KETOROLAC TROMETHAMINE 60 MG/2ML IM SOLN
60.0000 mg | Freq: Once | INTRAMUSCULAR | Status: AC
  Administered 2016-04-26: 60 mg via INTRAMUSCULAR
  Filled 2016-04-26: qty 2

## 2016-04-26 MED ORDER — LIDOCAINE HCL (PF) 1 % IJ SOLN
INTRAMUSCULAR | Status: AC
  Filled 2016-04-26: qty 5

## 2016-04-26 MED ORDER — LEVOFLOXACIN 500 MG PO TABS
500.0000 mg | ORAL_TABLET | Freq: Every day | ORAL | 0 refills | Status: AC
Start: 2016-04-26 — End: 2016-05-06

## 2016-04-26 MED ORDER — OXYCODONE-ACETAMINOPHEN 7.5-325 MG PO TABS: 1.0000 | ORAL_TABLET | Freq: Three times a day (TID) | ORAL | 0 refills | Status: AC | PRN

## 2016-04-26 MED ORDER — CEFTRIAXONE SODIUM 1 G IJ SOLR
1.0000 g | Freq: Once | INTRAMUSCULAR | Status: AC
  Administered 2016-04-26: 1 g via INTRAMUSCULAR
  Filled 2016-04-26: qty 10

## 2016-04-26 NOTE — ED Triage Notes (Signed)
Reports hitting a nail through his right hand yesterday.  Now having pain and swelling

## 2016-04-26 NOTE — ED Notes (Signed)
States he was using a crowbar yesterday   It slipped and a 3 inch nail went thur web space of right hand     Hand is painful and swollen this am

## 2016-04-26 NOTE — ED Provider Notes (Signed)
[facility] Emergency Department Provider Note  ____________________________________________   First MD Initiated Contact with Patient 04/26/16 [phone]     (approximate)  I have reviewed the triage vital signs and the nursing notes.   HISTORY  Chief Complaint Hand Pain    HPI Zachary L Wadle Montez HagemanJr. is a 25 y.o. male who presents to the emergency department after puncturing his right hand on a nail yesterday at 12:00 PM. He states that his hand has progressively become warm and swollen. His hand pain is 7 out of 10 in intensity and throbbing. He has been taking ibuprofen, which has not relieved his pain. Patient's pain seems to be getting worse and is aggravated with movement. He denies numbness, tingling and fever.    Past Medical History:  Diagnosis Date  . Hypertension     There are no active problems to display for this patient.   History reviewed. No pertinent surgical history.  Prior to Admission medications   Medication Sig Start Date End Date Taking? Authorizing Provider  dicyclomine (BENTYL) 20 MG tablet Take 1 tablet (20 mg total) by mouth 3 (three) times daily as needed for spasms. 01/13/16   Leona CarryLinda M Taylor, MD  fenofibrate 54 MG tablet Take 1 tablet by mouth daily. 11/17/15   Historical Provider, MD  ibuprofen (ADVIL,MOTRIN) 800 MG tablet Take 1 tablet (800 mg total) by mouth every 8 (eight) hours as needed. 03/01/16   Anne-Caroline Sharma CovertNorman, MD  levofloxacin (LEVAQUIN) 500 MG tablet Take 1 tablet (500 mg total) by mouth daily. 04/26/16 05/06/16  Orvil FeilJaclyn M Rosealee Recinos, PA-C  lisinopril (ZESTRIL) 2.5 MG tablet Take 1 tablet (2.5 mg total) by mouth daily. 01/13/16 01/12/17  Leona CarryLinda M Taylor, MD  ondansetron (ZOFRAN ODT) 4 MG disintegrating tablet Take 1 tablet (4 mg total) by mouth every 8 (eight) hours as needed for nausea or vomiting. 03/01/16   Rockne MenghiniAnne-Caroline Norman, MD  oxyCODONE-acetaminophen (PERCOCET) 7.5-325 MG tablet Take 1 tablet by mouth every 8 (eight) hours  as needed for severe pain. 04/26/16 04/29/16  Orvil FeilJaclyn M Jancarlos Thrun, PA-C  ranitidine (ZANTAC) 150 MG tablet Take 1 tablet (150 mg total) by mouth 2 (two) times daily. 01/13/16 01/12/17  Leona CarryLinda M Taylor, MD  sucralfate (CARAFATE) 1 g tablet Take 1 tablet (1 g total) by mouth 4 (four) times daily. 02/22/16 02/21/17  Jene Everyobert Kinner, MD    Allergies Patient has no known allergies.  No family history on file.  Social History Social History  Substance Use Topics  . Smoking status: Current Every Day Smoker    Packs/day: 1.00    Types: Cigarettes  . Smokeless tobacco: Never Used  . Alcohol use Yes     Comment: occasionally    Review of Systems Constitutional: No fever/chills Eyes: No visual changes. ENT: No sore throat. Cardiovascular: Denies chest pain. Respiratory: Denies shortness of breath. Gastrointestinal: No abdominal pain.  No nausea, no vomiting.  No diarrhea.  No constipation. Genitourinary: Negative for dysuria. Musculoskeletal: Patient has right hand pain. Skin: Negative for rash. Neurological: Negative for headaches, focal weakness or numbness.  10-point ROS otherwise negative.  ____________________________________________   PHYSICAL EXAM:  VITAL SIGNS: ED Triage Vitals  Enc Vitals Group     BP 04/26/16 0743 (!) 141/86     Pulse Rate 04/26/16 0743 94     Resp 04/26/16 0743 17     Temp 04/26/16 0743 98.5 F (36.9 C)     Temp Source 04/26/16 0743 Oral     SpO2 04/26/16 0743  97 %     Weight 04/26/16 0739 250 lb (113.4 kg)     Height 04/26/16 0739 5\' 11"  (1.803 m)     Head Circumference --      Peak Flow --      Pain Score 04/26/16 0739 8     Pain Loc --      Pain Edu? --      Excl. in GC? --    Constitutional: Alert and oriented. Well appearing and in no acute distress. Eyes: Conjunctivae are normal. PERRL. EOMI. Head: Atraumatic. Nose: No congestion/rhinnorhea. Mouth/Throat: Mucous membranes are moist.  Oropharynx non-erythematous. Neck: No stridor. Full range of  motion.  Hematological/Lymphatic/Immunilogical: No cervical lymphadenopathy. Cardiovascular: Normal rate, regular rhythm. Grossly normal heart sounds.  Good peripheral circulation. Respiratory: Normal respiratory effort.  No retractions. Lungs CTAB. Musculoskeletal: Patient's right hand is edematous and warm. A puncture site can be visualized between thumb and index finger within the thenar eminence. Patient has active flexion and extension of the first and second digits. Patient has 3 out of 5 grip strength, deficit likely secondary to swelling. Patient has strong, palpable radial and ulnar pulses Neurologic:  Normal speech and language. Patient has intact sensation to light and deep touch. Skin:  Skin is warm, dry and intact. No rash noted. Psychiatric: Mood and affect are normal. Speech and behavior are normal.  ____________________________________________   LABS (all labs ordered are listed, but only abnormal results are displayed)  Labs Reviewed - No data to display ____________________________________________   RADIOLOGY  DG Hand Complete reveals no fractures or foreign bodies.   PROCEDURES  Procedure(s) performed: None   Procedures    ____________________________________________   INITIAL IMPRESSION / ASSESSMENT AND PLAN / ED COURSE  Pertinent labs & imaging results that were available during my care of the patient were reviewed by me and considered in my medical decision making (see chart for details).    Clinical Course    Assessment & Plan: Puncture wound of right hand Patient's right hand was punctured by a nail yesterday. X-rays of the right hand revealed no fractures or foreign bodies. His tetanus status is up-to-date. On physical exam, patient's hand is warm and edematous. Patient was given ceftriaxone and Toradol in the emergency room. At discharge, he was prescribed Levaquin. Patient was advised to follow-up with his primary care provider in 2-3 days. He  was advised to return to the emergency department if symptoms acutely worsen. A short course of Percocet was prescribed to be used as needed for pain.  ____________________________________________   FINAL CLINICAL IMPRESSION(S) / ED DIAGNOSES  Final diagnoses:  Puncture wound of hand without foreign body, initial encounter      NEW MEDICATIONS STARTED DURING THIS VISIT:  Discharge Medication List as of 04/26/2016  9:21 AM    START taking these medications   Details  levofloxacin (LEVAQUIN) 500 MG tablet Take 1 tablet (500 mg total) by mouth daily., Starting Mon 04/26/2016, Until Thu 05/06/2016, Print    oxyCODONE-acetaminophen (PERCOCET) 7.5-325 MG tablet Take 1 tablet by mouth every 8 (eight) hours as needed for severe pain., Starting Mon 04/26/2016, Until Thu 04/29/2016, Print         Note:  This document was prepared using Dragon voice recognition software and may include unintentional dictation errors.    Orvil FeilJaclyn M Meyli Boice, PA-C 04/26/16 1403    Arnaldo NatalPaul F Malinda, MD 04/26/16 [phone]

## 2016-04-28 ENCOUNTER — Emergency Department
Admission: EM | Admit: 2016-04-28 | Discharge: 2016-04-28 | Disposition: A | Discharge status: Home / Self Care | Payer: No Typology Code available for payment source | Attending: Emergency Medicine | Admitting: Emergency Medicine

## 2016-04-28 DIAGNOSIS — F17210 Nicotine dependence, cigarettes, uncomplicated: Secondary | ICD-10-CM | POA: Insufficient documentation

## 2016-04-28 DIAGNOSIS — S6991XA Unspecified injury of right wrist, hand and finger(s), initial encounter: Secondary | ICD-10-CM | POA: Diagnosis present

## 2016-04-28 DIAGNOSIS — S61431A Puncture wound without foreign body of right hand, initial encounter: Secondary | ICD-10-CM | POA: Diagnosis not present

## 2016-04-28 DIAGNOSIS — Y939 Activity, unspecified: Secondary | ICD-10-CM | POA: Diagnosis not present

## 2016-04-28 DIAGNOSIS — Y999 Unspecified external cause status: Secondary | ICD-10-CM | POA: Diagnosis not present

## 2016-04-28 DIAGNOSIS — Y929 Unspecified place or not applicable: Secondary | ICD-10-CM | POA: Insufficient documentation

## 2016-04-28 DIAGNOSIS — I10 Essential (primary) hypertension: Secondary | ICD-10-CM | POA: Insufficient documentation

## 2016-04-28 DIAGNOSIS — W450XXA Nail entering through skin, initial encounter: Secondary | ICD-10-CM | POA: Diagnosis not present

## 2016-04-28 MED ORDER — TRAMADOL HCL 50 MG PO TABS: 50.0000 mg | ORAL_TABLET | Freq: Four times a day (QID) | ORAL | 0 refills | Status: DC | PRN

## 2016-04-28 MED ORDER — PREDNISONE 10 MG PO TABS: ORAL_TABLET | ORAL | 0 refills | Status: DC

## 2016-04-28 NOTE — ED Provider Notes (Signed)
[facility] Emergency Department Provider Note  ____________________________________________  Time seen: Approximately 4:50 PM  I have reviewed the triage vital signs and the nursing notes.   HISTORY  Chief Complaint Wound Check    HPI Zachary L Caywood Montez HagemanJr. is a 25 y.o. male, NAD, presents to the emergency department for wound recheck. States a nail went through his right hand 2 days ago. Was seen in this ED and had negative imaging. Was placed on antibiotics and given pain medications. States he has taken his medications as prescribed. Denies worsening pain, redness, swelling. No onset of oozing, weeping, bleeding. States when he uses his right hand, he has a shooting pain into the forearm. Denies numbness, weakness, tingling. No chest pain or shortness of breath. No further injuries or traumas to the hand.    Past Medical History:  Diagnosis Date  . Hypertension     There are no active problems to display for this patient.   History reviewed. No pertinent surgical history.  Prior to Admission medications   Medication Sig Start Date End Date Taking? Authorizing Provider  dicyclomine (BENTYL) 20 MG tablet Take 1 tablet (20 mg total) by mouth 3 (three) times daily as needed for spasms. 01/13/16   Leona CarryLinda M Taylor, MD  fenofibrate 54 MG tablet Take 1 tablet by mouth daily. 11/17/15   Historical Provider, MD  ibuprofen (ADVIL,MOTRIN) 800 MG tablet Take 1 tablet (800 mg total) by mouth every 8 (eight) hours as needed. 03/01/16   Anne-Caroline Sharma CovertNorman, MD  levofloxacin (LEVAQUIN) 500 MG tablet Take 1 tablet (500 mg total) by mouth daily. 04/26/16 05/06/16  Orvil FeilJaclyn M Woods, PA-C  lisinopril (ZESTRIL) 2.5 MG tablet Take 1 tablet (2.5 mg total) by mouth daily. 01/13/16 01/12/17  Leona CarryLinda M Taylor, MD  ondansetron (ZOFRAN ODT) 4 MG disintegrating tablet Take 1 tablet (4 mg total) by mouth every 8 (eight) hours as needed for nausea or vomiting. 03/01/16   Rockne MenghiniAnne-Caroline Norman, MD   oxyCODONE-acetaminophen (PERCOCET) 7.5-325 MG tablet Take 1 tablet by mouth every 8 (eight) hours as needed for severe pain. 04/26/16 04/29/16  Orvil FeilJaclyn M Woods, PA-C  predniSONE (DELTASONE) 10 MG tablet Take a daily regimen of 6,5,4,3,2,1 04/28/16   Dia Jefferys L Marchelle Rinella, PA-C  ranitidine (ZANTAC) 150 MG tablet Take 1 tablet (150 mg total) by mouth 2 (two) times daily. 01/13/16 01/12/17  Leona CarryLinda M Taylor, MD  sucralfate (CARAFATE) 1 g tablet Take 1 tablet (1 g total) by mouth 4 (four) times daily. 02/22/16 02/21/17  Jene Everyobert Kinner, MD  traMADol (ULTRAM) 50 MG tablet Take 1 tablet (50 mg total) by mouth every 6 (six) hours as needed. 04/28/16   Sakura Denis L Kolby Myung, PA-C    Allergies Patient has no known allergies.  No family history on file.  Social History Social History  Substance Use Topics  . Smoking status: Current Every Day Smoker    Packs/day: 1.00    Types: Cigarettes  . Smokeless tobacco: Never Used  . Alcohol use Yes     Comment: occasionally     Review of Systems  Constitutional: No fever/chills Cardiovascular: No chest pain. Respiratory: No shortness of breath.  Musculoskeletal: Positive right hand pain with occassional shooting pain in right forearm.  Skin: Negative for rash, redness, swelling, oozing, weeping, bleeding. Neurological: Negative for numbness, weakness, tingling. 10-point ROS otherwise negative.  ____________________________________________   PHYSICAL EXAM:  VITAL SIGNS: ED Triage Vitals  Enc Vitals Group     BP 04/28/16 1632 (!) 142/88  Pulse Rate 04/28/16 1632 72     Resp 04/28/16 1632 17     Temp 04/28/16 1632 98 F (36.7 C)     Temp Source 04/28/16 1632 Oral     SpO2 04/28/16 1632 98 %     Weight 04/28/16 1631 250 lb (113.4 kg)     Height 04/28/16 1631 5\' 11"  (1.803 m)     Head Circumference --      Peak Flow --      Pain Score 04/28/16 1631 8     Pain Loc --      Pain Edu? --      Excl. in GC? --      Constitutional: Alert and oriented. Well  appearing and in no acute distress. Eyes: Conjunctivae are normal. Head: Atraumatic. Cardiovascular:  Good peripheral circulation with 2+ pulses in the right upper extremity. Capillary refill is brisk in all digits of the right hand. Respiratory: Normal respiratory effort without tachypnea or retractions.  Musculoskeletal: Tender to palpation at the puncture site on the right hand with minimal swelling and redness. Scabbing of the wound is noted without new discharge, oozing or weeping. FROM of the right hand and fingers with moderate pain with grip about the puncture site. Neurologic:  Normal speech and language. No gross focal neurologic deficits are appreciated. Sensation to light touch grossly in tact about the right upper extremity Skin:  Skin is warm, dry. No rash, streaking  noted. Psychiatric: Mood and affect are normal. Speech and behavior are normal. Patient exhibits appropriate insight and judgement.   ____________________________________________   LABS  None ____________________________________________  EKG  None ____________________________________________  RADIOLOGY  None ____________________________________________    PROCEDURES  Procedure(s) performed: None   Procedures   Medications - No data to display   ____________________________________________   INITIAL IMPRESSION / ASSESSMENT AND PLAN / ED COURSE  Pertinent labs & imaging results that were available during my care of the patient were reviewed by me and considered in my medical decision making (see chart for details).  Clinical Course     Patient's diagnosis is consistent with puncture wound of right hand without foreign body and wound recheck. Patient will be discharged home with prescriptions for prednisone and ultram to take as directed. Patient is to follow up with Dr. Joice LoftsPoggi in orthopedics in 48 hours for further evaluation and treatment. Patient is given ED precautions to return to the  ED for any worsening or new symptoms.   ____________________________________________  FINAL CLINICAL IMPRESSION(S) / ED DIAGNOSES  Final diagnoses:  Puncture wound of right hand without foreign body, initial encounter      NEW MEDICATIONS STARTED DURING THIS VISIT:  Discharge Medication List as of 04/28/2016  5:00 PM    START taking these medications   Details  predniSONE (DELTASONE) 10 MG tablet Take a daily regimen of 6,5,4,3,2,1, Print    traMADol (ULTRAM) 50 MG tablet Take 1 tablet (50 mg total) by mouth every 6 (six) hours as needed., Starting Wed 04/28/2016, Print             Ernestene KielJami L TekamahHagler, PA-C 04/28/16 1812    Jennye MoccasinBrian S Quigley, MD 04/28/16 [phone]

## 2016-04-28 NOTE — ED Triage Notes (Signed)
Pt is here for a wound check of the right hand.

## 2016-04-28 NOTE — ED Notes (Signed)
Pt states injury occurred 3 days ago. Pt states pain is still "terrible" and he has sharp shooting pain whenever he tries to use his hand.

## 2017-09-19 ENCOUNTER — Encounter: Payer: Self-pay | Admitting: Medical Oncology

## 2017-09-19 ENCOUNTER — Emergency Department: Payer: No Typology Code available for payment source

## 2017-09-19 ENCOUNTER — Emergency Department
Admission: EM | Admit: 2017-09-19 | Discharge: 2017-09-19 | Disposition: A | Discharge status: Home / Self Care | Payer: No Typology Code available for payment source | Attending: Emergency Medicine | Admitting: Emergency Medicine

## 2017-09-19 DIAGNOSIS — F17210 Nicotine dependence, cigarettes, uncomplicated: Secondary | ICD-10-CM | POA: Insufficient documentation

## 2017-09-19 DIAGNOSIS — R0789 Other chest pain: Secondary | ICD-10-CM | POA: Insufficient documentation

## 2017-09-19 DIAGNOSIS — R55 Syncope and collapse: Secondary | ICD-10-CM | POA: Insufficient documentation

## 2017-09-19 DIAGNOSIS — I10 Essential (primary) hypertension: Secondary | ICD-10-CM | POA: Insufficient documentation

## 2017-09-19 DIAGNOSIS — Z79899 Other long term (current) drug therapy: Secondary | ICD-10-CM | POA: Insufficient documentation

## 2017-09-19 LAB — CBC
HCT: 41.6 % (ref 40.0–52.0)
Hemoglobin: 14.2 g/dL (ref 13.0–18.0)
MCH: 29.6 pg (ref 26.0–34.0)
MCHC: 34.2 g/dL (ref 32.0–36.0)
MCV: 86.5 fL (ref 80.0–100.0)
Platelets: 259 10*3/uL (ref 150–440)
RBC: 4.81 MIL/uL (ref 4.40–5.90)
RDW: 13.2 % (ref 11.5–14.5)
WBC: 7.4 10*3/uL (ref 3.8–10.6)

## 2017-09-19 LAB — BASIC METABOLIC PANEL
Anion gap: 6 (ref 5–15)
BUN: 16 mg/dL (ref 6–20)
CO2: 30 mmol/L (ref 22–32)
Calcium: 9 mg/dL (ref 8.9–10.3)
Chloride: 103 mmol/L (ref 101–111)
Creatinine, Ser: 0.88 mg/dL (ref 0.61–1.24)
GFR calc Af Amer: >60 mL/min (ref >60)
GFR calc non Af Amer: >60 mL/min (ref >60)
Glucose, Bld: 91 mg/dL (ref 65–99)
Potassium: 3.8 mmol/L (ref 3.5–5.1)
Sodium: 139 mmol/L (ref 135–145)

## 2017-09-19 LAB — TROPONIN I: Troponin I: <0.03 ng/mL (ref <0.03)

## 2017-09-19 MED ORDER — LISINOPRIL 2.5 MG PO TABS: 2.5000 mg | ORAL_TABLET | Freq: Every day | ORAL | 1 refills | Status: DC

## 2017-09-19 NOTE — ED Notes (Signed)
Pt reports dizziness and chest pain since this morning   Pt states his blood pressure was elevated   Pt not on meds for blood pressure.  No n/v/  Pt alert  Speech clear.

## 2017-09-19 NOTE — Discharge Instructions (Addendum)
Make an appointment to follow-up with your primary care doctor within the next few weeks.  We have prescribed a 7443-month supply of your blood pressure medication so you can restart that.  Return to the ER for new, worsening, or persistent chest pain, weakness or lightheadedness, elevated blood pressure, difficulty breathing, or any other new or worsening symptoms that are concerning.

## 2017-09-19 NOTE — ED Provider Notes (Signed)
[facility] Emergency Department Provider Note ____________________________________________   First MD Initiated Contact with Patient 09/19/17 1717     (approximate)  I have reviewed the triage vital signs and the nursing notes.   HISTORY  Chief Complaint Dizziness and Chest Pain    HPI Zachary L Puertas Montez Hageman. is a 27 y.o. male with no significant past medical history except for hypertension who presents with dizziness, described as lightheadedness, gradual onset this morning, and persisting throughout the day, and making him feel like he might pass out.  He states it was associated with hypertension but this was just subjective.  The patient reports that around 8 AM when he came to work he had some chest discomfort but this has since resolved.  He states that this is happened in the past and he was told he was dehydrated.  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  He also reports increased stress at his job.   Past Medical History:  Diagnosis Date  . Hypertension     There are no active problems to display for this patient.   History reviewed. No pertinent surgical history.  Prior to Admission medications   Medication Sig Start Date End Date Taking? Authorizing Provider  dicyclomine (BENTYL) 20 MG tablet Take 1 tablet (20 mg total) by mouth 3 (three) times daily as needed for spasms. 01/13/16   Leona Carry, MD  fenofibrate 54 MG tablet Take 1 tablet by mouth daily. 11/17/15   [provider]  ibuprofen (ADVIL,MOTRIN) 800 MG tablet Take 1 tablet (800 mg total) by mouth every 8 (eight) hours as needed. 03/01/16   Rockne Menghini, MD  lisinopril (ZESTRIL) 2.5 MG tablet Take 1 tablet (2.5 mg total) by mouth daily. 09/19/17 11/18/17  Dionne Bucy, MD  ondansetron (ZOFRAN ODT) 4 MG disintegrating tablet Take 1 tablet (4 mg total) by mouth every 8 (eight) hours as needed for nausea or vomiting. 03/01/16   Rockne Menghini, MD  predniSONE (DELTASONE) 10 MG tablet Take a daily regimen of 6,5,4,3,2,1 04/28/16   Hagler, Jami L, PA-C  ranitidine (ZANTAC) 150 MG tablet Take 1 tablet (150 mg total) by mouth 2 (two) times daily. 01/13/16 01/12/17  Leona Carry, MD  sucralfate (CARAFATE) 1 g tablet Take 1 tablet (1 g total) by mouth 4 (four) times daily. 02/22/16 02/21/17  Jene Every, MD  traMADol (ULTRAM) 50 MG tablet Take 1 tablet (50 mg total) by mouth every 6 (six) hours as needed. 04/28/16   Hagler, Jami L, PA-C    Allergies Patient has no known allergies.  No family history on file.  Social History Social History   Tobacco Use  . Smoking status: Current Every Day Smoker    Packs/day: 1.00    Types: Cigarettes  . Smokeless tobacco: Never Used  Substance Use Topics  . Alcohol use: Yes    Comment: occasionally  . Drug use: Not on file    Review of Systems  Constitutional: No fever. Eyes: No redness. ENT: No sore throat. Cardiovascular: Positive for resolved chest pain. Respiratory: Denies shortness of breath. Gastrointestinal: No vomiting.  Genitourinary: Negative for flank pain.  Musculoskeletal: Negative for back pain. Skin: Negative for rash. Neurological: Positive for frontal headache.   ____________________________________________   PHYSICAL EXAM:  VITAL SIGNS: ED Triage Vitals  Enc Vitals Group     BP 09/19/17 1621 (!) 141/95     Pulse Rate 09/19/17 1621 76     Resp 09/19/17  1621 17     Temp 09/19/17 1621 98.3 F (36.8 C)     Temp Source 09/19/17 1621 Oral     SpO2 09/19/17 1621 99 %     Weight 09/19/17 1621 235 lb (106.6 kg)     Height 09/19/17 1621 5\' 10"  (1.778 m)     Head Circumference --      Peak Flow --      Pain Score 09/19/17 1638 0     Pain Loc --      Pain Edu? --      Excl. in GC? --     Constitutional: Alert and oriented. Well appearing and in no acute distress. Eyes: Conjunctivae are normal.  Head: Atraumatic. Nose: No  congestion/rhinnorhea. Mouth/Throat: Mucous membranes are moist.   Neck: Normal range of motion.  Cardiovascular: Normal rate, regular rhythm. Grossly normal heart sounds.  Good peripheral circulation. Respiratory: Normal respiratory effort.  No retractions. Lungs CTAB. Gastrointestinal: No distention.  Genitourinary: No flank tenderness. Musculoskeletal: No lower extremity edema.  Extremities warm and well perfused.  Neurologic:  Normal speech and language. No gross focal neurologic deficits are appreciated.  Skin:  Skin is warm and dry. No rash noted. Psychiatric: Mood and affect are normal. Speech and behavior are normal.  ____________________________________________   LABS (all labs ordered are listed, but only abnormal results are displayed)  Labs Reviewed  BASIC METABOLIC PANEL  CBC  TROPONIN I   ____________________________________________  EKG  ED ECG REPORT I, Dionne Bucy, the attending physician, personally viewed and interpreted this ECG.  Date: 09/19/2017 EKG Time: 1636 Rate: 78 Rhythm: normal sinus rhythm QRS Axis: normal Intervals: normal ST/T Wave abnormalities: normal Narrative Interpretation: no evidence of acute ischemia  ____________________________________________  RADIOLOGY  CXR: no focal infiltrate or other acute findings  ____________________________________________   PROCEDURES  Procedure(s) performed: No  Procedures  Critical Care performed: No ____________________________________________   INITIAL IMPRESSION / ASSESSMENT AND PLAN / ED COURSE  Pertinent labs & imaging results that were available during my care of the patient were reviewed by me and considered in my medical decision making (see chart for details).  27 year old male with history of hypertension presents with atypical chest pain which is now resolved and occurred almost 10 hours ago, as well as lightheadedness and near syncope while at work today.  Patient was  working outside and it was higher today than it has been recently.  He also reports increased stress.  On exam, vital signs are normal except for hypertension, and the remainder the exam is unremarkable.  Patient is well-appearing.  EKG is normal and labs are unremarkable.    Overall presentation is consistent with likely vasovagal near syncope and possible mild dehydration which is now resolved.  There is no evidence for ACS.  EKG and troponin are negative, and given the duration since the chest pain, there is no indication for repeat enzymes.  There is no clinical evidence for PE, aortic dissection or other vascular cause or other concerning etiology.  At this time patient is appropriate for discharge home.  He feels well and would like to go home.  Return precautions given, and he expresses understanding.  He states he lost his insurance for some time and was not able to follow up with a doctor or get his blood pressure medication.  I have re-prescribed a 52-month supply of his lisinopril, and the patient states that he will be able to follow-up with his old PMD.  ____________________________________________   FINAL CLINICAL IMPRESSION(S) / ED DIAGNOSES  Final diagnoses:  Near syncope  Atypical chest pain      NEW MEDICATIONS STARTED DURING THIS VISIT:  Current Discharge Medication List       Note:  This document was prepared using Dragon voice recognition software and may include unintentional dictation errors.    Dionne BucySiadecki, Christasia Angeletti, MD 09/19/17 [phone]

## 2017-09-19 NOTE — ED Triage Notes (Signed)
Pt reports this am he was at work when he felt faint, weak, and had left sided chest pressure. Denies chest pain at this time. A/O x 4, NAD noted. Denies sob. Under a lot of stress recently.

## 2017-09-19 NOTE — ED Triage Notes (Signed)
FIRST NURSE NOTE-feeling weak, ambulatory. Declined wheelchair.  Has had some chest pains.  NAD. Color WNL

## 2018-04-28 ENCOUNTER — Ambulatory Visit: Payer: Self-pay | Admitting: Pulmonary Disease

## 2018-08-03 ENCOUNTER — Emergency Department
Admission: EM | Admit: 2018-08-03 | Discharge: 2018-08-03 | Disposition: A | Discharge status: Home / Self Care | Payer: Self-pay | Attending: Emergency Medicine | Admitting: Emergency Medicine

## 2018-08-03 ENCOUNTER — Encounter: Payer: Self-pay | Admitting: Emergency Medicine

## 2018-08-03 DIAGNOSIS — I10 Essential (primary) hypertension: Secondary | ICD-10-CM | POA: Insufficient documentation

## 2018-08-03 DIAGNOSIS — Z79899 Other long term (current) drug therapy: Secondary | ICD-10-CM | POA: Insufficient documentation

## 2018-08-03 DIAGNOSIS — J020 Streptococcal pharyngitis: Secondary | ICD-10-CM

## 2018-08-03 DIAGNOSIS — F17210 Nicotine dependence, cigarettes, uncomplicated: Secondary | ICD-10-CM | POA: Insufficient documentation

## 2018-08-03 LAB — GROUP A STREP BY PCR: Group A Strep by PCR: DETECTED — AB

## 2018-08-03 MED ORDER — LIDOCAINE VISCOUS HCL 2 % MT SOLN
15.0000 mL | Freq: Once | OROMUCOSAL | Status: AC
  Administered 2018-08-03: 15 mL via OROMUCOSAL
  Filled 2018-08-03: qty 15

## 2018-08-03 MED ORDER — LIDOCAINE VISCOUS HCL 2 % MT SOLN: 10.0000 mL | OROMUCOSAL | 0 refills | Status: DC | PRN

## 2018-08-03 MED ORDER — ONDANSETRON 4 MG PO TBDP
4.0000 mg | ORAL_TABLET | Freq: Once | ORAL | Status: AC
  Administered 2018-08-03: 4 mg via ORAL
  Filled 2018-08-03: qty 1

## 2018-08-03 MED ORDER — AMOXICILLIN-POT CLAVULANATE 400-57 MG/5ML PO SUSR
875.0000 mg | Freq: Three times a day (TID) | ORAL | Status: DC
  Administered 2018-08-03: 875 mg via ORAL
  Filled 2018-08-03 (×2): qty 17.5
  Filled 2018-08-03: qty 10.9

## 2018-08-03 MED ORDER — AMOXICILLIN-POT CLAVULANATE 250-62.5 MG/5ML PO SUSR: 875.0000 mg | Freq: Two times a day (BID) | ORAL | 0 refills | Status: AC

## 2018-08-03 MED ORDER — DEXAMETHASONE SODIUM PHOSPHATE 10 MG/ML IJ SOLN
10.0000 mg | Freq: Once | INTRAMUSCULAR | Status: AC
  Administered 2018-08-03: 10 mg via INTRAMUSCULAR
  Filled 2018-08-03: qty 1

## 2018-08-03 MED ORDER — AMOXICILLIN-POT CLAVULANATE 250-62.5 MG/5ML PO SUSR: 875.0000 mg | Freq: Two times a day (BID) | ORAL | 0 refills | Status: DC

## 2018-08-03 NOTE — ED Triage Notes (Signed)
Called [facility]om [facility] to treatment room, no response

## 2018-08-03 NOTE — ED Triage Notes (Signed)
Called from [facility] to treatment room, no response 

## 2018-08-03 NOTE — ED Provider Notes (Signed)
[facility] Emergency Department Provider Note  ____________________________________________  Time seen: Approximately 2:00 PM  I have reviewed the triage vital signs and the nursing notes.   HISTORY  Chief Complaint Sore Throat and Fever    HPI Zachary Mendoza. is a 28 y.o. male that presents to the emergency department for evaluation of sore throat worsening for 5 days. Pain is worse on the right. Patient states that he is having trouble keeping fluids down without vomiting. No known fever. No sick contacts. No nasal congestion, cough.   Past Medical History:  Diagnosis Date  . Hypertension     There are no active problems to display for this patient.   History reviewed. No pertinent surgical history.  Prior to Admission medications   Medication Sig Start Date End Date Taking? Authorizing Provider  amoxicillin-clavulanate (AUGMENTIN) 250-62.5 MG/5ML suspension Take 17.5 mLs (875 mg total) by mouth 2 (two) times daily for 10 days. 08/03/18 08/13/18  Enid Derry, PA-C  dicyclomine (BENTYL) 20 MG tablet Take 1 tablet (20 mg total) by mouth 3 (three) times daily as needed for spasms. 01/13/16   Leona Carry, MD  fenofibrate 54 MG tablet Take 1 tablet by mouth daily. 11/17/15   [provider]  ibuprofen (ADVIL,MOTRIN) 800 MG tablet Take 1 tablet (800 mg total) by mouth every 8 (eight) hours as needed. 03/01/16   Rockne Menghini, MD  lidocaine (XYLOCAINE) 2 % solution Use as directed 10 mLs in the mouth or throat as needed. 08/03/18   Enid Derry, PA-C  lisinopril (ZESTRIL) 2.5 MG tablet Take 1 tablet (2.5 mg total) by mouth daily. 09/19/17 11/18/17  Dionne Bucy, MD  ondansetron (ZOFRAN ODT) 4 MG disintegrating tablet Take 1 tablet (4 mg total) by mouth every 8 (eight) hours as needed for nausea or vomiting. 03/01/16   Rockne Menghini, MD  predniSONE (DELTASONE) 10 MG tablet Take a daily regimen of 6,5,4,3,2,1 04/28/16   Hagler, Jami  L, PA-C  ranitidine (ZANTAC) 150 MG tablet Take 1 tablet (150 mg total) by mouth 2 (two) times daily. 01/13/16 01/12/17  Leona Carry, MD  sucralfate (CARAFATE) 1 g tablet Take 1 tablet (1 g total) by mouth 4 (four) times daily. 02/22/16 02/21/17  Jene Every, MD  traMADol (ULTRAM) 50 MG tablet Take 1 tablet (50 mg total) by mouth every 6 (six) hours as needed. 04/28/16   Hagler, Jami L, PA-C    Allergies Patient has no known allergies.  No family history on file.  Social History Social History   Tobacco Use  . Smoking status: Current Every Day Smoker    Packs/day: 1.00    Types: Cigarettes  . Smokeless tobacco: Never Used  Substance Use Topics  . Alcohol use: Yes    Comment: occasionally  . Drug use: Not on file     Review of Systems  Constitutional: No fever/chills Eyes: No visual changes. No discharge. ENT: Negative for congestion and rhinorrhea. Cardiovascular: No chest pain. Respiratory: Negative for cough. No SOB. Gastrointestinal: No abdominal pain.  No diarrhea.  No constipation. Musculoskeletal: Negative for musculoskeletal pain. Skin: Negative for rash, abrasions, lacerations, ecchymosis. Neurological: Negative for headaches.   ____________________________________________   PHYSICAL EXAM:  VITAL SIGNS: ED Triage Vitals  Enc Vitals Group     BP 08/03/18 1311 (!) 148/100     Pulse Rate 08/03/18 1311 88     Resp 08/03/18 1311 18     Temp 08/03/18 1311 98.7 F (37.1 C)  Temp Source 08/03/18 1311 Oral     SpO2 08/03/18 1311 97 %     Weight 08/03/18 1305 225 lb (102.1 kg)     Height 08/03/18 1305 5\' 9"  (1.753 m)     Head Circumference --      Peak Flow --      Pain Score 08/03/18 1305 5     Pain Loc --      Pain Edu? --      Excl. in GC? --      Constitutional: Alert and oriented. Well appearing and in no acute distress. Eyes: Conjunctivae are normal. PERRL. EOMI. No discharge. Head: Atraumatic. ENT: No frontal and maxillary sinus  tenderness.      Ears: Tympanic membranes pearly gray with good landmarks. No discharge.      Nose: Mild congestion/rhinnorhea.      Mouth/Throat: Mucous membranes are moist. Oropharynx erythematous. Tonsils enlarged 1+ bilaterally, mildly worse on the right. No exudates. Uvula midline. No drooling. No difficulty opening or closing mouth.  Neck: No stridor.   Hematological/Lymphatic/Immunilogical: No cervical lymphadenopathy. Cardiovascular: Normal rate, regular rhythm.  Good peripheral circulation. Respiratory: Normal respiratory effort without tachypnea or retractions. Lungs CTAB. Good air entry to the bases with no decreased or absent breath sounds. Gastrointestinal: Bowel sounds 4 quadrants. Soft and nontender to palpation. No guarding or rigidity. No palpable masses. No distention. Musculoskeletal: Full range of motion to all extremities. No gross deformities appreciated. Neurologic:  Normal speech and language. No gross focal neurologic deficits are appreciated.  Skin:  Skin is warm, dry and intact. No rash noted. Psychiatric: Mood and affect are normal. Speech and behavior are normal. Patient exhibits appropriate insight and judgement.   ____________________________________________   LABS (all labs ordered are listed, but only abnormal results are displayed)  Labs Reviewed  GROUP A STREP BY PCR - Abnormal; Notable for the following components:      Result Value   Group A Strep by PCR DETECTED (*)    All other components within normal limits   ____________________________________________  EKG   ____________________________________________  RADIOLOGY  No results found.  ____________________________________________    PROCEDURES  Procedure(s) performed:    Procedures    Medications  amoxicillin-clavulanate (AUGMENTIN) 400-57 MG/5ML suspension 875 mg (875 mg Oral Given 08/03/18 1609)  lidocaine (XYLOCAINE) 2 % viscous mouth solution 15 mL (15 mLs Mouth/Throat  Given 08/03/18 1450)  dexamethasone (DECADRON) injection 10 mg (10 mg Intramuscular Given 08/03/18 1450)  ondansetron (ZOFRAN-ODT) disintegrating tablet 4 mg (4 mg Oral Given 08/03/18 1450)     ____________________________________________   INITIAL IMPRESSION / ASSESSMENT AND PLAN / ED COURSE  Pertinent labs & imaging results that were available during my care of the patient were reviewed by me and considered in my medical decision making (see chart for details).  Review of the [place] CSRS was performed in accordance of the [facility] prior to dispensing any controlled drugs.     Patient's diagnosis is consistent with strep throat.  Vital signs and exam are reassuring.  Patient is drinking juice without difficulty after viscous lidocaine.  Patient states that he feels much better after medications.  He was given IM Decadron for swelling.  He had a dose of Augmentin in the emergency department.  Vital signs and exam are reassuring. Patient appears well and is staying well hydrated. Patient should alternate tylenol and ibuprofen for fever. Patient feels comfortable going home. Patient will be discharged home with prescriptions for Augmentin. Patient is to follow up with  primary care as needed or otherwise directed. Patient is given ED precautions to return to the ED for any worsening or new symptoms.     ____________________________________________  FINAL CLINICAL IMPRESSION(S) / ED DIAGNOSES  Final diagnoses:  Strep throat      NEW MEDICATIONS STARTED DURING THIS VISIT:  ED Discharge Orders         Ordered    amoxicillin-clavulanate (AUGMENTIN) 250-62.5 MG/5ML suspension  2 times daily,   Status:  Discontinued     08/03/18 1552    lidocaine (XYLOCAINE) 2 % solution  As needed     08/03/18 1552    amoxicillin-clavulanate (AUGMENTIN) 250-62.5 MG/5ML suspension  2 times daily     08/03/18 1614              This chart was dictated using voice recognition software/Dragon. Despite  best efforts to proofread, errors can occur which can change the meaning. Any change was purely unintentional.    Enid DerryWagner, Calahan Pak, PA-C 08/03/18 [id number]    Kinner, Robert, MD 08/04/18 [phone]

## 2018-08-03 NOTE — ED Triage Notes (Signed)
Pt reports sore throat for the last 5 days, worsening and now he has a fever as well.

## 2018-10-30 ENCOUNTER — Other Ambulatory Visit: Payer: Self-pay

## 2018-10-30 ENCOUNTER — Emergency Department
Admission: EM | Admit: 2018-10-30 | Discharge: 2018-10-30 | Disposition: A | Discharge status: Home / Self Care | Payer: Self-pay | Attending: Emergency Medicine | Admitting: Emergency Medicine

## 2018-10-30 DIAGNOSIS — I10 Essential (primary) hypertension: Secondary | ICD-10-CM | POA: Insufficient documentation

## 2018-10-30 DIAGNOSIS — K219 Gastro-esophageal reflux disease without esophagitis: Secondary | ICD-10-CM | POA: Insufficient documentation

## 2018-10-30 DIAGNOSIS — F17210 Nicotine dependence, cigarettes, uncomplicated: Secondary | ICD-10-CM | POA: Insufficient documentation

## 2018-10-30 LAB — CBC
HCT: 46.3 % (ref 39.0–52.0)
Hemoglobin: 15.6 g/dL (ref 13.0–17.0)
MCH: 30.3 pg (ref 26.0–34.0)
MCHC: 33.7 g/dL (ref 30.0–36.0)
MCV: 89.9 fL (ref 80.0–100.0)
Platelets: 274 10*3/uL (ref 150–400)
RBC: 5.15 MIL/uL (ref 4.22–5.81)
RDW: 12.4 % (ref 11.5–15.5)
WBC: 6.6 10*3/uL (ref 4.0–10.5)
nRBC: 0 % (ref 0.0–0.2)

## 2018-10-30 LAB — URINALYSIS, COMPLETE (UACMP) WITH MICROSCOPIC
Bilirubin Urine: NEGATIVE
Glucose, UA: NEGATIVE mg/dL
Hgb urine dipstick: NEGATIVE
Ketones, ur: NEGATIVE mg/dL
Leukocytes,Ua: NEGATIVE
Nitrite: NEGATIVE
Protein, ur: NEGATIVE mg/dL
Specific Gravity, Urine: 1.015 (ref 1.005–1.030)
Squamous Epithelial / HPF: NONE SEEN (ref 0–5)
pH: 5 (ref 5.0–8.0)

## 2018-10-30 LAB — COMPREHENSIVE METABOLIC PANEL
ALT: 35 U/L (ref 0–44)
AST: 38 U/L (ref 15–41)
Albumin: 4.1 g/dL (ref 3.5–5.0)
Alkaline Phosphatase: 80 U/L (ref 38–126)
Anion gap: 13 (ref 5–15)
BUN: 13 mg/dL (ref 6–20)
CO2: 22 mmol/L (ref 22–32)
Calcium: 9.4 mg/dL (ref 8.9–10.3)
Chloride: 102 mmol/L (ref 98–111)
Creatinine, Ser: 0.73 mg/dL (ref 0.61–1.24)
GFR calc Af Amer: >60 mL/min (ref >60)
GFR calc non Af Amer: >60 mL/min (ref >60)
Glucose, Bld: 175 mg/dL — ABNORMAL HIGH (ref 70–99)
Potassium: 3.5 mmol/L (ref 3.5–5.1)
Sodium: 137 mmol/L (ref 135–145)
Total Bilirubin: 0.6 mg/dL (ref 0.3–1.2)
Total Protein: 7.6 g/dL (ref 6.5–8.1)

## 2018-10-30 LAB — LIPASE, BLOOD: Lipase: 50 U/L (ref 11–51)

## 2018-10-30 MED ORDER — SODIUM CHLORIDE 0.9% FLUSH: 3.0000 mL | Freq: Once | INTRAVENOUS | Status: DC

## 2018-10-30 MED ORDER — SUCRALFATE 1 G PO TABS: 1.0000 g | ORAL_TABLET | Freq: Four times a day (QID) | ORAL | 1 refills | Status: DC

## 2018-10-30 MED ORDER — ALUM & MAG HYDROXIDE-SIMETH 200-200-20 MG/5ML PO SUSP
30.0000 mL | Freq: Once | ORAL | Status: AC
  Administered 2018-10-30: 30 mL via ORAL
  Filled 2018-10-30: qty 30

## 2018-10-30 MED ORDER — SUCRALFATE 1 G PO TABS
1.0000 g | ORAL_TABLET | Freq: Once | ORAL | Status: AC
  Administered 2018-10-30: 1 g via ORAL
  Filled 2018-10-30: qty 1

## 2018-10-30 MED ORDER — FAMOTIDINE 20 MG PO TABS: 20.0000 mg | ORAL_TABLET | Freq: Two times a day (BID) | ORAL | 1 refills | Status: DC

## 2018-10-30 MED ORDER — LIDOCAINE VISCOUS HCL 2 % MT SOLN
15.0000 mL | Freq: Once | OROMUCOSAL | Status: AC
  Administered 2018-10-30: 15 mL via ORAL
  Filled 2018-10-30: qty 15

## 2018-10-30 MED ORDER — FAMOTIDINE 20 MG PO TABS
20.0000 mg | ORAL_TABLET | Freq: Once | ORAL | Status: AC
  Administered 2018-10-30: 20 mg via ORAL
  Filled 2018-10-30: qty 1

## 2018-10-30 NOTE — ED Provider Notes (Signed)
[facility] Emergency Department Provider Note       Time seen: ----------------------------------------- 12:50 PM on 10/30/2018 -----------------------------------------   I have reviewed the triage vital signs and the nursing notes.  HISTORY   Chief Complaint Abdominal Pain    HPI Zachary L Barbone Montez HagemanJr. is a 28 y.o. male with a history of hypertension who presents to the ED for generalized abdominal pain with diarrhea.  Patient states he has a history of gastritis that is chronic but is not able to go to his primary care doctor due to loss of insurance.  Currently pain is 5 out of 10 with chronic pain in the abdomen.  Past Medical History:  Diagnosis Date  . Hypertension     There are no active problems to display for this patient.   History reviewed. No pertinent surgical history.  Allergies Patient has no known allergies.  Social History Social History   Tobacco Use  . Smoking status: Current Every Day Smoker    Packs/day: 1.00    Types: Cigarettes  . Smokeless tobacco: Never Used  Substance Use Topics  . Alcohol use: Yes    Comment: occasionally  . Drug use: Not on file   Review of Systems Constitutional: Negative for fever. Cardiovascular: Negative for chest pain. Respiratory: Negative for shortness of breath. Gastrointestinal: Positive for abdominal pain, diarrhea Musculoskeletal: Negative for back pain. Skin: Negative for rash. Neurological: Negative for headaches, focal weakness or numbness.  All systems negative/normal/unremarkable except as stated in the HPI  ____________________________________________   PHYSICAL EXAM:  VITAL SIGNS: ED Triage Vitals  Enc Vitals Group     BP 10/30/18 1153 (!) 171/104     Pulse Rate 10/30/18 1153 84     Resp 10/30/18 1153 17     Temp 10/30/18 1153 98.1 F (36.7 C)     Temp Source 10/30/18 1153 Oral     SpO2 10/30/18 1153 98 %     Weight 10/30/18 1154 220 lb (99.8 kg)     Height  10/30/18 1154 5\' 11"  (1.803 m)     Head Circumference --      Peak Flow --      Pain Score 10/30/18 1154 5     Pain Loc --      Pain Edu? --      Excl. in GC? --     Constitutional: Alert and oriented. Well appearing and in no distress. Eyes: Conjunctivae are normal. Normal extraocular movements. Cardiovascular: Normal rate, regular rhythm. No murmurs, rubs, or gallops. Respiratory: Normal respiratory effort without tachypnea nor retractions. Breath sounds are clear and equal bilaterally. No wheezes/rales/rhonchi. Gastrointestinal: Soft and nontender. Normal bowel sounds Musculoskeletal: Nontender with normal range of motion in extremities. No lower extremity tenderness nor edema. Neurologic:  Normal speech and language. No gross focal neurologic deficits are appreciated.  Skin:  Skin is warm, dry and intact. No rash noted. Psychiatric: Mood and affect are normal. Speech and behavior are normal.  ____________________________________________  ED COURSE:  As part of my medical decision making, I reviewed the following data within the electronic MEDICAL RECORD NUMBER History obtained from family if available, nursing notes, old chart and ekg, as well as notes from prior ED visits. Patient presented for abdominal pain and diarrhea, we will assess with labs and imaging as indicated at this time.   Procedures  Zachary L Sutherland Jr. was evaluated in Emergency Department on 10/30/2018 for the symptoms described in the history of present illness. He was evaluated  in the context of the global COVID-19 pandemic, which necessitated consideration that the patient might be at risk for infection with the SARS-CoV-2 virus that causes COVID-19. Institutional protocols and algorithms that pertain to the evaluation of patients at risk for COVID-19 are in a state of rapid change based on information released by regulatory bodies including the CDC and federal and state organizations. These policies and algorithms were  followed during the patient's care in the ED.  ____________________________________________   LABS (pertinent positives/negatives)  Labs Reviewed  COMPREHENSIVE METABOLIC PANEL - Abnormal; Notable for the following components:      Result Value   Glucose, Bld 175 (*)    All other components within normal limits  URINALYSIS, COMPLETE (UACMP) WITH MICROSCOPIC - Abnormal; Notable for the following components:   Color, Urine YELLOW (*)    APPearance CLEAR (*)    Bacteria, UA RARE (*)    All other components within normal limits  LIPASE, BLOOD  CBC  ____________________________________________   DIFFERENTIAL DIAGNOSIS   Chronic pain, gastritis, pancreatitis, peptic ulcer disease, GERD  FINAL ASSESSMENT AND PLAN  Abdominal pain   Plan: The patient had presented for dental pain and diarrhea. Patient's labs are reassuring.  Patient was given a GI cocktail and restarted on Pepcid and Carafate.  He is cleared for outpatient follow-up as needed.   Ulice Dash, MD    Note: This note was generated in part or whole with voice recognition software. Voice recognition is usually quite accurate but there are transcription errors that can and very often do occur. I apologize for any typographical errors that were not detected and corrected.     Emily Filbert, MD 10/30/18 [id number]

## 2018-10-30 NOTE — ED Triage Notes (Signed)
Pt c/o generalized abd pain with diarrhea states he has a hx of gastritis that is chronic but has not been able to go to the PCP due to loss of insurance.

## 2018-10-31 ENCOUNTER — Telehealth: Payer: Self-pay | Admitting: Gastroenterology

## 2018-10-31 NOTE — Telephone Encounter (Signed)
LEFT VM FOR PT TO CALL OFFICE AND SCHEDULE VIRTUAL VISIT WITH DR. TAHILIANI

## 2018-11-21 ENCOUNTER — Encounter: Payer: Self-pay | Admitting: Gastroenterology

## 2018-12-06 ENCOUNTER — Encounter: Payer: Self-pay | Admitting: Emergency Medicine

## 2018-12-06 ENCOUNTER — Other Ambulatory Visit: Payer: Self-pay

## 2018-12-06 ENCOUNTER — Emergency Department
Admission: EM | Admit: 2018-12-06 | Discharge: 2018-12-06 | Disposition: A | Discharge status: Home / Self Care | Payer: Self-pay | Attending: Emergency Medicine | Admitting: Emergency Medicine

## 2018-12-06 DIAGNOSIS — R55 Syncope and collapse: Secondary | ICD-10-CM | POA: Insufficient documentation

## 2018-12-06 DIAGNOSIS — Z79899 Other long term (current) drug therapy: Secondary | ICD-10-CM | POA: Insufficient documentation

## 2018-12-06 DIAGNOSIS — I10 Essential (primary) hypertension: Secondary | ICD-10-CM | POA: Insufficient documentation

## 2018-12-06 DIAGNOSIS — F17210 Nicotine dependence, cigarettes, uncomplicated: Secondary | ICD-10-CM | POA: Insufficient documentation

## 2018-12-06 LAB — CBC
HCT: 42 % (ref 39.0–52.0)
Hemoglobin: 14.5 g/dL (ref 13.0–17.0)
MCH: 30.7 pg (ref 26.0–34.0)
MCHC: 34.5 g/dL (ref 30.0–36.0)
MCV: 88.8 fL (ref 80.0–100.0)
Platelets: 249 10*3/uL (ref 150–400)
RBC: 4.73 MIL/uL (ref 4.22–5.81)
RDW: 12.4 % (ref 11.5–15.5)
WBC: 6.2 10*3/uL (ref 4.0–10.5)
nRBC: 0 % (ref 0.0–0.2)

## 2018-12-06 LAB — BASIC METABOLIC PANEL
Anion gap: 11 (ref 5–15)
BUN: 13 mg/dL (ref 6–20)
CO2: 29 mmol/L (ref 22–32)
Calcium: 9 mg/dL (ref 8.9–10.3)
Chloride: 97 mmol/L — ABNORMAL LOW (ref 98–111)
Creatinine, Ser: 0.8 mg/dL (ref 0.61–1.24)
GFR calc Af Amer: >60 mL/min (ref >60)
GFR calc non Af Amer: >60 mL/min (ref >60)
Glucose, Bld: 131 mg/dL — ABNORMAL HIGH (ref 70–99)
Potassium: 3.4 mmol/L — ABNORMAL LOW (ref 3.5–5.1)
Sodium: 137 mmol/L (ref 135–145)

## 2018-12-06 LAB — GLUCOSE, CAPILLARY: Glucose-Capillary: 127 mg/dL — ABNORMAL HIGH (ref 70–99)

## 2018-12-06 MED ORDER — SODIUM CHLORIDE 0.9 % IV BOLUS
1000.0000 mL | Freq: Once | INTRAVENOUS | Status: AC
  Administered 2018-12-06: 1000 mL via INTRAVENOUS

## 2018-12-06 NOTE — ED Notes (Signed)
Pt unable to give urine specimen at this. Pt reports he was outside mowing grass and felt faint, reports he was dizzy and "it was bad". Pt's skin warm to touch, pink in color, pt was resting quietly on stretcher with eyes closed when RN entered room.

## 2018-12-06 NOTE — ED Triage Notes (Signed)
Patient reports he was outside working and got really hot and dizzy. States he feels like he may pass out. Patient reports history of HTN but states not on medications for it. Patient states he feels dehydrated.

## 2018-12-06 NOTE — ED Provider Notes (Signed)
[facility] Emergency Department Provider Note  Time seen: 2:49 PM  I have reviewed the triage vital signs and the nursing notes.   HISTORY  Chief Complaint Dizziness    HPI Zachary L Delira Brooke Bonito. is a 28 y.o. male patient presents emergency department after feeling dizzy/lightheaded earlier today.  According to the patient at 11:00 today he was mowing yards in the heat when he began feeling lightheaded like he might pass out.  Patient states he is not drinking much fluids today.  Denies any chest pain or shortness of breath at any point.  Patient states he is feeling much better at this time.  Largely negative review of systems.  No recent fever cough congestion or shortness of breath.   Past Medical History:  Diagnosis Date  . Hypertension     There are no active problems to display for this patient.   History reviewed. No pertinent surgical history.  Prior to Admission medications   Medication Sig Start Date End Date Taking? Authorizing Provider  famotidine (PEPCID) 20 MG tablet Take 1 tablet (20 mg total) by mouth 2 (two) times daily. 10/30/18   Earleen Newport, MD  omeprazole (PRILOSEC OTC) 20 MG tablet Take 20 mg by mouth daily.    [provider]  sucralfate (CARAFATE) 1 g tablet Take 1 tablet (1 g total) by mouth 4 (four) times daily. 10/30/18 10/30/19  Earleen Newport, MD    No Known Allergies  No family history on file.  Social History Social History   Tobacco Use  . Smoking status: Current Every Day Smoker    Packs/day: 1.00    Types: Cigarettes  . Smokeless tobacco: Never Used  Substance Use Topics  . Alcohol use: Yes    Comment: daily  . 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 Respiratory: Negative for shortness of breath. Gastrointestinal: Negative for abdominal pain Musculoskeletal: Negative for  musculoskeletal complaints Skin: Negative for skin complaints  Neurological: Negative for headache All other ROS negative  ____________________________________________   PHYSICAL EXAM:  VITAL SIGNS: ED Triage Vitals  Enc Vitals Group     BP 12/06/18 1238 (!) 142/90     Pulse Rate 12/06/18 1238 91     Resp 12/06/18 1238 18     Temp 12/06/18 1238 98.7 F (37.1 C)     Temp Source 12/06/18 1238 Oral     SpO2 12/06/18 1238 96 %     Weight 12/06/18 1239 220 lb (99.8 kg)     Height 12/06/18 1239 5\' 11"  (1.803 m)     Head Circumference --      Peak Flow --      Pain Score 12/06/18 1239 0     Pain Loc --      Pain Edu? --      Excl. in [place]? --     Constitutional: Alert and oriented. Well appearing and in no distress. Eyes: Normal exam ENT      Head: Normocephalic and atraumatic.      Mouth/Throat: Mucous membranes are moist. Cardiovascular: Normal rate, regular rhythm. No murmur Respiratory: Normal respiratory effort without tachypnea nor retractions. Breath sounds are clear  Gastrointestinal: Soft and nontender. No distention.   Musculoskeletal: Nontender with normal range of motion in all extremities.  Neurologic:  Normal speech and language. No gross focal neurologic deficits  Skin:  Skin is warm,  dry and intact.  Psychiatric: Mood and affect are normal.  ____________________________________________    EKG  EKG viewed and interpreted by myself shows a normal sinus rhythm 87 bpm with a narrow QRS, normal axis, normal intervals, nonspecific ST changes.  ____________________________________________   INITIAL IMPRESSION / ASSESSMENT AND PLAN / ED COURSE  Pertinent labs & imaging results that were available during my care of the patient were reviewed by me and considered in my medical decision making (see chart for details).   Patient presents emergency department after near syncopal episode while out in the heat mowing a lawn.  Patient's lab work is overall reassuring.   Patient appears well, states no symptoms at this time.  We will dose a liter of IV fluids as a precaution.  With his lab work looking normal reassuring EKG and being asymptomatic at this time I believe the patient is safe for discharge home.  I recommended plenty of rest and plenty of fluids and discussed return precautions.  Zachary L Wack Jr. was evaluated in Emergency Department on 12/06/2018 for the symptoms described in the history of present illness. He was evaluated in the context of the global COVID-19 pandemic, which necessitated consideration that the patient might be at risk for infection with the SARS-CoV-2 virus that causes COVID-19. Institutional protocols and algorithms that pertain to the evaluation of patients at risk for COVID-19 are in a state of rapid change based on information released by regulatory bodies including the CDC and federal and state organizations. These policies and algorithms were followed during the patient's care in the ED.  ____________________________________________   FINAL CLINICAL IMPRESSION(S) / ED DIAGNOSES  Near syncope   Minna AntisPaduchowski, Mosi Hannold, MD 12/06/18 1451

## 2018-12-06 NOTE — Discharge Instructions (Addendum)
As discussed please drink plenty of fluids and obtain plenty of rest today.  Return to the emergency department for any further episodes of dizziness, any chest pain, trouble breathing, or any other symptom personally concerning to yourself.

## 2018-12-06 NOTE — ED Notes (Signed)
Patient states he currently drinks alcohol each night as well as frequent heroin use. Patient states he used heroin this morning at approximately 730.

## 2018-12-06 NOTE — ED Notes (Addendum)
AAOX4.NAD. Vitals stable. No reports of Dizziness.

## 2018-12-25 ENCOUNTER — Other Ambulatory Visit: Payer: Self-pay

## 2018-12-25 ENCOUNTER — Encounter: Payer: Self-pay | Admitting: Emergency Medicine

## 2018-12-25 ENCOUNTER — Emergency Department
Admission: EM | Admit: 2018-12-25 | Discharge: 2018-12-25 | Disposition: A | Discharge status: Home / Self Care | Payer: Self-pay | Attending: Emergency Medicine | Admitting: Emergency Medicine

## 2018-12-25 DIAGNOSIS — Z5321 Procedure and treatment not carried out due to patient leaving prior to being seen by health care provider: Secondary | ICD-10-CM | POA: Insufficient documentation

## 2018-12-25 DIAGNOSIS — R109 Unspecified abdominal pain: Secondary | ICD-10-CM | POA: Insufficient documentation

## 2018-12-25 LAB — URINALYSIS, COMPLETE (UACMP) WITH MICROSCOPIC
Bacteria, UA: NONE SEEN
Bilirubin Urine: NEGATIVE
Glucose, UA: NEGATIVE mg/dL
Hgb urine dipstick: NEGATIVE
Ketones, ur: NEGATIVE mg/dL
Leukocytes,Ua: NEGATIVE
Nitrite: NEGATIVE
Protein, ur: NEGATIVE mg/dL
Specific Gravity, Urine: 1.003 — ABNORMAL LOW (ref 1.005–1.030)
Squamous Epithelial / HPF: NONE SEEN (ref 0–5)
pH: 6 (ref 5.0–8.0)

## 2018-12-25 LAB — COMPREHENSIVE METABOLIC PANEL
ALT: 41 U/L (ref 0–44)
AST: 52 U/L — ABNORMAL HIGH (ref 15–41)
Albumin: 4.5 g/dL (ref 3.5–5.0)
Alkaline Phosphatase: 99 U/L (ref 38–126)
Anion gap: 15 (ref 5–15)
BUN: 8 mg/dL (ref 6–20)
CO2: 24 mmol/L (ref 22–32)
Calcium: 9.5 mg/dL (ref 8.9–10.3)
Chloride: 97 mmol/L — ABNORMAL LOW (ref 98–111)
Creatinine, Ser: 0.66 mg/dL (ref 0.61–1.24)
GFR calc Af Amer: >60 mL/min (ref >60)
GFR calc non Af Amer: >60 mL/min (ref >60)
Glucose, Bld: 140 mg/dL — ABNORMAL HIGH (ref 70–99)
Potassium: 3.5 mmol/L (ref 3.5–5.1)
Sodium: 136 mmol/L (ref 135–145)
Total Bilirubin: 0.6 mg/dL (ref 0.3–1.2)
Total Protein: 8.2 g/dL — ABNORMAL HIGH (ref 6.5–8.1)

## 2018-12-25 LAB — CBC
HCT: 46.1 % (ref 39.0–52.0)
Hemoglobin: 15.9 g/dL (ref 13.0–17.0)
MCH: 30.3 pg (ref 26.0–34.0)
MCHC: 34.5 g/dL (ref 30.0–36.0)
MCV: 87.8 fL (ref 80.0–100.0)
Platelets: 314 10*3/uL (ref 150–400)
RBC: 5.25 MIL/uL (ref 4.22–5.81)
RDW: 12.4 % (ref 11.5–15.5)
WBC: 7.8 10*3/uL (ref 4.0–10.5)
nRBC: 0 % (ref 0.0–0.2)

## 2018-12-25 LAB — URINE DRUG SCREEN, QUALITATIVE (ARMC ONLY)
Amphetamines, Ur Screen: NOT DETECTED
Barbiturates, Ur Screen: NOT DETECTED
Benzodiazepine, Ur Scrn: NOT DETECTED
Cannabinoid 50 Ng, Ur ~~LOC~~: NOT DETECTED
Cocaine Metabolite,Ur ~~LOC~~: NOT DETECTED
MDMA (Ecstasy)Ur Screen: NOT DETECTED
Methadone Scn, Ur: NOT DETECTED
Opiate, Ur Screen: NOT DETECTED
Phencyclidine (PCP) Ur S: NOT DETECTED
Tricyclic, Ur Screen: NOT DETECTED

## 2018-12-25 LAB — ETHANOL: Alcohol, Ethyl (B): 200 mg/dL — ABNORMAL HIGH (ref <10)

## 2018-12-25 LAB — LIPASE, BLOOD: Lipase: 37 U/L (ref 11–51)

## 2018-12-25 NOTE — ED Triage Notes (Signed)
Patient reports for the last 4 days, he has had severe abdominal pain and emesis after eating anything. Patient states he was recently diagnosed with gastritis but was unable to afford the medication that was prescribed. Also reports he is out of HTN medication. Patient states he stopped using heroin one week ago.  Patient tearful in triage and states he missed several days of work last week due to abdominal issues. States he also feels depressed and that he has no energy or motivation to do anything. Patient denies SI but states, "I need help. I'm the only one who earns an income and I have to take care of my family." Patient reports family history of depression but states he has never experienced this before.

## 2019-02-12 ENCOUNTER — Emergency Department
Admission: EM | Admit: 2019-02-12 | Discharge: 2019-02-12 | Disposition: A | Discharge status: Home / Self Care | Payer: Self-pay | Attending: Emergency Medicine | Admitting: Emergency Medicine

## 2019-02-12 ENCOUNTER — Other Ambulatory Visit: Payer: Self-pay

## 2019-02-12 DIAGNOSIS — F17210 Nicotine dependence, cigarettes, uncomplicated: Secondary | ICD-10-CM | POA: Insufficient documentation

## 2019-02-12 DIAGNOSIS — I10 Essential (primary) hypertension: Secondary | ICD-10-CM | POA: Insufficient documentation

## 2019-02-12 DIAGNOSIS — R7401 Elevation of levels of liver transaminase levels: Secondary | ICD-10-CM

## 2019-02-12 DIAGNOSIS — R740 Nonspecific elevation of levels of transaminase and lactic acid dehydrogenase [LDH]: Secondary | ICD-10-CM | POA: Insufficient documentation

## 2019-02-12 DIAGNOSIS — F419 Anxiety disorder, unspecified: Secondary | ICD-10-CM

## 2019-02-12 DIAGNOSIS — L309 Dermatitis, unspecified: Secondary | ICD-10-CM | POA: Insufficient documentation

## 2019-02-12 DIAGNOSIS — F10280 Alcohol dependence with alcohol-induced anxiety disorder: Secondary | ICD-10-CM | POA: Insufficient documentation

## 2019-02-12 LAB — COMPREHENSIVE METABOLIC PANEL
ALT: 46 U/L — ABNORMAL HIGH (ref 0–44)
AST: 58 U/L — ABNORMAL HIGH (ref 15–41)
Albumin: 4.8 g/dL (ref 3.5–5.0)
Alkaline Phosphatase: 80 U/L (ref 38–126)
Anion gap: 14 (ref 5–15)
BUN: 10 mg/dL (ref 6–20)
CO2: 25 mmol/L (ref 22–32)
Calcium: 9.6 mg/dL (ref 8.9–10.3)
Chloride: 97 mmol/L — ABNORMAL LOW (ref 98–111)
Creatinine, Ser: 0.85 mg/dL (ref 0.61–1.24)
GFR calc Af Amer: >60 mL/min (ref >60)
GFR calc non Af Amer: >60 mL/min (ref >60)
Glucose, Bld: 164 mg/dL — ABNORMAL HIGH (ref 70–99)
Potassium: 3.7 mmol/L (ref 3.5–5.1)
Sodium: 136 mmol/L (ref 135–145)
Total Bilirubin: 0.4 mg/dL (ref 0.3–1.2)
Total Protein: 8.1 g/dL (ref 6.5–8.1)

## 2019-02-12 LAB — CBC
HCT: 44.1 % (ref 39.0–52.0)
Hemoglobin: 15.1 g/dL (ref 13.0–17.0)
MCH: 30.2 pg (ref 26.0–34.0)
MCHC: 34.2 g/dL (ref 30.0–36.0)
MCV: 88.2 fL (ref 80.0–100.0)
Platelets: 281 10*3/uL (ref 150–400)
RBC: 5 MIL/uL (ref 4.22–5.81)
RDW: 12.1 % (ref 11.5–15.5)
WBC: 5.5 10*3/uL (ref 4.0–10.5)
nRBC: 0 % (ref 0.0–0.2)

## 2019-02-12 LAB — LIPASE, BLOOD: Lipase: 33 U/L (ref 11–51)

## 2019-02-12 MED ORDER — SODIUM CHLORIDE 0.9% FLUSH: 3.0000 mL | Freq: Once | INTRAVENOUS | Status: DC

## 2019-02-12 NOTE — ED Provider Notes (Signed)
[facility] Emergency Department Provider Note   ____________________________________________   First MD Initiated Contact with Patient 02/12/19 1810     (approximate)  I have reviewed the triage vital signs and the nursing notes.   HISTORY  Chief Complaint Abdominal Pain    HPI Zachary L Honda Montez HagemanJr. is a 28 y.o. male comes for evaluation  because he is extremely worried about his alcohol abuse  Patient tells me that he has been drinking very heavily for at least 3 years now, up to a pint of liquor daily.  He does still work, he is noticed over the last several days that when he is at work he will start to get very anxious, he will have to have a beer to calm his symptoms.  The same happened today.  He reports he is become very anxious, he is noticed a bit of a red rash over his hands, also a little over his back.  He tried a steroid cream that a nurse friend of his recommended it seems to be getting better, but still having some in his hand.  He also felt very panicky at work today, but this is starting to get better.  He reports that his last drink was just after lunch today.  Thinks he started to feel very anxious, feels like his drinking is likely causing "kidney damage".  He acknowledges that he really needs help with his drinking, wants to make sure his internal organs are not showing signs of damage in the blood work.  He did have a bit of an upset stomach earlier, he reports that occurred at work, after drinking a beer his symptoms called and he feels better now but wants to get checked out.  Denies exposure to COVID  Past Medical History:  Diagnosis Date  . Hypertension     There are no active problems to display for this patient.   History reviewed. No pertinent surgical history.  Prior to Admission medications   Medication Sig Start Date End Date Taking? Authorizing Provider  famotidine (PEPCID) 20 MG tablet Take 1 tablet (20 mg total) by mouth  2 (two) times daily. 10/30/18   Emily FilbertWilliams, Jonathan E, MD  omeprazole (PRILOSEC OTC) 20 MG tablet Take 20 mg by mouth daily.    [provider]  sucralfate (CARAFATE) 1 g tablet Take 1 tablet (1 g total) by mouth 4 (four) times daily. 10/30/18 10/30/19  Emily FilbertWilliams, Jonathan E, MD    Allergies Patient has no known allergies.  No family history on file.  Social History Social History   Tobacco Use  . Smoking status: Current Every Day Smoker    Packs/day: 1.00    Types: Cigarettes  . Smokeless tobacco: Never Used  Substance Use Topics  . Alcohol use: Yes    Comment: daily  . Drug use: Not Currently    Comment: heroin multiple times a week    Review of Systems Constitutional: No fever/chills Eyes: No visual changes. ENT: No sore throat. Cardiovascular: Felt a racing feeling in his chest, this is occurred several times while at work over the last several weeks, today again it occurred and got better after drinking a drink. Respiratory: Denies shortness of breath. Gastrointestinal: Had some abdominal pain, felt crampy with nausea at work today but went away after drinking a beer Genitourinary: Negative for dysuria. Musculoskeletal: Negative for back pain. Skin: See HPI. Neurological: Negative for headaches, areas of focal weakness or numbness.    ____________________________________________  PHYSICAL EXAM:  VITAL SIGNS: ED Triage Vitals [02/12/19 1446]  Enc Vitals Group     BP (!) 167/107     Pulse Rate 92     Resp 18     Temp 98.3 F (36.8 C)     Temp Source Oral     SpO2 99 %     Weight 200 lb (90.7 kg)     Height 6\' 3"  (1.905 m)     Head Circumference      Peak Flow      Pain Score 5     Pain Loc      Pain Edu?      Excl. in GC?     Constitutional: Alert and oriented. Well appearing and in no acute distress.  Somewhat obese. Eyes: Conjunctivae are normal. Head: Atraumatic. Nose: No congestion/rhinnorhea. Mouth/Throat: Mucous membranes are moist.  Neck: No stridor.  Cardiovascular: Normal rate, regular rhythm. Grossly normal heart sounds.  Good peripheral circulation. Respiratory: Normal respiratory effort.  No retractions. Lungs CTAB. Gastrointestinal: Soft and nontender. No distention.  No fluid wave or evidence to suggest acute ascites.  Normal umbilicus. Musculoskeletal: No lower extremity tenderness nor edema. Neurologic:  Normal speech and language. No gross focal neurologic deficits are appreciated.  Skin:  Skin is warm, dry and intact. No rash noted except for very mild redness over the right dorsal hand, no tracking erythema, it appears isolated primarily to hair follicles without purulence or induration.  No rash noted on the back of her left hand. Psychiatric: Mood and affect are normal to slightly anxious. Speech and behavior are normal.  ____________________________________________   LABS (all labs ordered are listed, but only abnormal results are displayed)  Labs Reviewed  COMPREHENSIVE METABOLIC PANEL - Abnormal; Notable for the following components:      Result Value   Chloride 97 (*)    Glucose, Bld 164 (*)    AST 58 (*)    ALT 46 (*)    All other components within normal limits  LIPASE, BLOOD  CBC  URINALYSIS, COMPLETE (UACMP) WITH MICROSCOPIC   ____________________________________________  EKG  Reviewed enterotomy at 1840 Heart rate 90 QRS 90 QTc 440 Normal sinus rhythm, incomplete right bundle.  No evidence of acute ischemia denoted. ____________________________________________  RADIOLOGY   ____________________________________________   PROCEDURES  Procedure(s) performed: None  Procedures  Critical Care performed: No  ____________________________________________   INITIAL IMPRESSION / ASSESSMENT AND PLAN / ED COURSE  Pertinent labs & imaging results that were available during my care of the patient were reviewed by me and considered in my medical decision making (see chart for  details).   Patient is for evaluation, multiple complaints including skin rash that appears to be consistent with dermatitis or folliculitis without evidence of infection such as cellulitis or superinfection.  Suspect likely dermatitis as he reports he responded well to steroid creams in other areas.  Additionally, he reports nausea tremulousness and palpitations at work over the last several days and I suspect this is due to alcohol dependence as he reports his symptoms abate after drinking alcohol and he does drink over a pint of liquor daily now for long-term.  No ongoing abdominal pain.  Lab work shows mild transaminase elevation.  Also noted to be hypertensive, discussed with the patient extensively and discussed treatment options and he would like to be able to be discharged, and will call residential treatment services in the morning to discuss detox options there.  I think this is agreeable, he  does not show evidence of acute alcohol withdrawal or delirium.  He is not tremulous, he is hypertensive and slightly anxious but reports anxiety at baseline.  He is hypertensive, but he also appears quite anxious.  I do not see a clear indication to start him on antihypertensive yet, encouraged very close follow-up with primary care doctor and he will be calling detox resources in the morning.  He does not show evidence of acute abdomen today.  I encouraged him to follow-up with setting up a primary care doctor, follow-up with gastroenterology, and he will be calling residential treatment services in the morning.      ____________________________________________   FINAL CLINICAL IMPRESSION(S) / ED DIAGNOSES  Final diagnoses:  Alcohol dependence with alcohol-induced anxiety disorder ([place])  Anxiety  Dermatitis  Transaminitis        Note:  This document was prepared using Dragon voice recognition software and may include unintentional dictation errors       Delman Kitten, MD 02/12/19 1841

## 2019-02-12 NOTE — Discharge Instructions (Signed)
You have been seen in the Emergency Department (ED) today for substance abuse.    Please return to the ED immediately if you have ANY thoughts of hurting yourself or anyone else, so that we may help you.   Follow up with your doctor and/or therapist as soon as possible regarding today's ED  visit.   Please follow up any other recommendations and clinic appointments provided by the psychiatry team that saw you in the Emergency Department.

## 2019-02-12 NOTE — ED Triage Notes (Signed)
Pt comes via POV from home with c/o abdominal pain. Pt states this started around lunch today. Pt also states he thinks he might be in kidney failure.  Pt states he usually drinks a pint of liquor a day for the last several years. Pt states he has recently been trying to cut back and has been drinking beer. Pt states last drink was around 0300 that was a shot or two.  Pt states his anxiety has been through the roof and causes his BP to elevate.  Pt states he might be dehydrated too.

## 2019-02-27 ENCOUNTER — Other Ambulatory Visit: Payer: Self-pay

## 2019-02-27 ENCOUNTER — Encounter: Payer: Self-pay | Admitting: Emergency Medicine

## 2019-02-27 ENCOUNTER — Emergency Department: Payer: Self-pay

## 2019-02-27 ENCOUNTER — Emergency Department
Admission: EM | Admit: 2019-02-27 | Discharge: 2019-02-27 | Disposition: A | Discharge status: Home / Self Care | Payer: Self-pay | Attending: Emergency Medicine | Admitting: Emergency Medicine

## 2019-02-27 DIAGNOSIS — R0789 Other chest pain: Secondary | ICD-10-CM | POA: Insufficient documentation

## 2019-02-27 DIAGNOSIS — Z79899 Other long term (current) drug therapy: Secondary | ICD-10-CM | POA: Insufficient documentation

## 2019-02-27 DIAGNOSIS — F17210 Nicotine dependence, cigarettes, uncomplicated: Secondary | ICD-10-CM | POA: Insufficient documentation

## 2019-02-27 DIAGNOSIS — F419 Anxiety disorder, unspecified: Secondary | ICD-10-CM

## 2019-02-27 DIAGNOSIS — F1010 Alcohol abuse, uncomplicated: Secondary | ICD-10-CM

## 2019-02-27 DIAGNOSIS — I10 Essential (primary) hypertension: Secondary | ICD-10-CM | POA: Insufficient documentation

## 2019-02-27 LAB — CBC
HCT: 45.8 % (ref 39.0–52.0)
Hemoglobin: 15.3 g/dL (ref 13.0–17.0)
MCH: 30 pg (ref 26.0–34.0)
MCHC: 33.4 g/dL (ref 30.0–36.0)
MCV: 89.8 fL (ref 80.0–100.0)
Platelets: 282 10*3/uL (ref 150–400)
RBC: 5.1 MIL/uL (ref 4.22–5.81)
RDW: 11.9 % (ref 11.5–15.5)
WBC: 6.9 10*3/uL (ref 4.0–10.5)
nRBC: 0 % (ref 0.0–0.2)

## 2019-02-27 LAB — TROPONIN I (HIGH SENSITIVITY): Troponin I (High Sensitivity): 5 ng/L (ref <18)

## 2019-02-27 LAB — BASIC METABOLIC PANEL
Anion gap: 15 (ref 5–15)
BUN: 11 mg/dL (ref 6–20)
CO2: 24 mmol/L (ref 22–32)
Calcium: 9.4 mg/dL (ref 8.9–10.3)
Chloride: 98 mmol/L (ref 98–111)
Creatinine, Ser: 0.77 mg/dL (ref 0.61–1.24)
GFR calc Af Amer: >60 mL/min (ref >60)
GFR calc non Af Amer: >60 mL/min (ref >60)
Glucose, Bld: 185 mg/dL — ABNORMAL HIGH (ref 70–99)
Potassium: 3.4 mmol/L — ABNORMAL LOW (ref 3.5–5.1)
Sodium: 137 mmol/L (ref 135–145)

## 2019-02-27 MED ORDER — LORAZEPAM 1 MG PO TABS
1.0000 mg | ORAL_TABLET | Freq: Once | ORAL | Status: AC
  Administered 2019-02-27: 10:00:00 1 mg via ORAL
  Filled 2019-02-27: qty 1

## 2019-02-27 MED ORDER — SODIUM CHLORIDE 0.9% FLUSH: 3.0000 mL | Freq: Once | INTRAVENOUS | Status: DC

## 2019-02-27 NOTE — ED Triage Notes (Signed)
Pt here with c/o cp that began around 0400 this am. States he's trying to quit drinking, having a lot of anxiety and feels the cp is related to that. Anxious in triage, states he really wants to quit and could use help with his anxiety, here today to be sure he isn't having a heart attack. NAD.

## 2019-02-27 NOTE — ED Triage Notes (Signed)
Pt c/o having left sided chest pain today, states "I have anxiety but I want to make sure it is not my heart". Pt is in NAD on arrival.

## 2019-02-27 NOTE — ED Provider Notes (Signed)
[facility] Emergency Department Provider Note   ____________________________________________   First MD Initiated Contact with Patient 02/27/19 [phone]     (approximate)  I have reviewed the triage vital signs and the nursing notes.   HISTORY  Chief Complaint Chest Pain    HPI Zachary L Hutsell Brooke Bonito. is a 28 y.o. male with past medical history of hypertension and alcohol abuse who presents to the ED complaining of chest pain.  Patient reports he has been working on gradually cutting back on his alcohol consumption, states he was drinking approximately a liter of liquor every day a couple of months ago, is now down to about 6 beers per day typically.  He states this is caused him to have a lot of anxiety and this morning he was feeling similarly.  He states he had a dull ache in different parts of his chest this morning which was not exacerbated or alleviated by anything.  He denies any associated fevers, chills, cough, or shortness of breath.  He had a couple of drinks this morning, but states this did not alleviate his symptoms.  He states he wants to make sure there is nothing wrong with his heart.        Past Medical History:  Diagnosis Date  . Hypertension     There are no active problems to display for this patient.   History reviewed. No pertinent surgical history.  Prior to Admission medications   Medication Sig Start Date End Date Taking? Authorizing Provider  famotidine (PEPCID) 20 MG tablet Take 1 tablet (20 mg total) by mouth 2 (two) times daily. 10/30/18   Earleen Newport, MD  omeprazole (PRILOSEC OTC) 20 MG tablet Take 20 mg by mouth daily.    [provider]  sucralfate (CARAFATE) 1 g tablet Take 1 tablet (1 g total) by mouth 4 (four) times daily. 10/30/18 10/30/19  Earleen Newport, MD    Allergies Penicillins  No family history on file.  Social History Social History   Tobacco Use  . Smoking status: Current Every Day  Smoker    Packs/day: 1.00    Types: Cigarettes  . Smokeless tobacco: Never Used  Substance Use Topics  . Alcohol use: Yes    Comment: daily  . Drug use: Not Currently    Comment: heroin multiple times a week    Review of Systems  Constitutional: No fever/chills Eyes: No visual changes. ENT: No sore throat. Cardiovascular: Positive for chest pain. Respiratory: Denies shortness of breath. Gastrointestinal: No abdominal pain.  No nausea, no vomiting.  No diarrhea.  No constipation. Genitourinary: Negative for dysuria. Musculoskeletal: Negative for back pain. Skin: Negative for rash. Neurological: Negative for headaches, focal weakness or numbness.  ____________________________________________   PHYSICAL EXAM:  VITAL SIGNS: ED Triage Vitals  Enc Vitals Group     BP 02/27/19 0805 (!) 152/110     Pulse Rate 02/27/19 0805 97     Resp 02/27/19 0805 18     Temp 02/27/19 0805 98.2 F (36.8 C)     Temp Source 02/27/19 0805 Oral     SpO2 02/27/19 0805 97 %     Weight 02/27/19 0806 220 lb (99.8 kg)     Height 02/27/19 0806 5\' 11"  (1.803 m)     Head Circumference --      Peak Flow --      Pain Score 02/27/19 0805 7     Pain Loc --      Pain Edu? --  Excl. in GC? --     Constitutional: Alert and oriented.  Anxious appearing. Eyes: Conjunctivae are normal. Head: Atraumatic. Nose: No congestion/rhinnorhea. Mouth/Throat: Mucous membranes are moist. Neck: Normal ROM Cardiovascular: Normal rate, regular rhythm. Grossly normal heart sounds. Respiratory: Normal respiratory effort.  No retractions. Lungs CTAB. Gastrointestinal: Soft and nontender. No distention. Genitourinary: deferred Musculoskeletal: No lower extremity tenderness nor edema. Neurologic:  Normal speech and language. No gross focal neurologic deficits are appreciated. Skin:  Skin is warm, dry and intact. No rash noted. Psychiatric: Mood and affect are normal. Speech and behavior are normal.   ____________________________________________   LABS (all labs ordered are listed, but only abnormal results are displayed)  Labs Reviewed  BASIC METABOLIC PANEL - Abnormal; Notable for the following components:      Result Value   Potassium 3.4 (*)    Glucose, Bld 185 (*)    All other components within normal limits  CBC  TROPONIN I (HIGH SENSITIVITY)   ____________________________________________  EKG  ED ECG REPORT I, Chesley Noonharles Tylah Mancillas, the attending physician, personally viewed and interpreted this ECG.   Date: 02/27/2019  EKG Time: 8:03  Rate: 86  Rhythm: normal sinus rhythm  Axis: Normal  Intervals:none  ST&T Change: None    PROCEDURES  Procedure(s) performed (including Critical Care):  Procedures   ____________________________________________   INITIAL IMPRESSION / ASSESSMENT AND PLAN / ED COURSE       [age] year old male with history of alcohol abuse presents to the ED for diffuse dull ache in his chest which is been associated with increasing anxiety as patient has been cutting down on alcohol consumption.  EKG without acute ischemic changes and initial troponin negative, very low suspicion for ACS given patient's age, lack of risk factors, and atypical symptoms.  Given constant symptoms for greater than 2 hours, no indication for repeat troponin.  There is likely a component of mild alcohol withdrawal, no evidence of severe withdrawal at this time.  Chest x-ray negative for acute process and remainder of labs unremarkable.  Will give Ativan for symptomatic improvement, counseled patient on continuing gradual decrease of alcohol intake, he declines assistance with detox.  Counseled to follow-up with PCP, patient agrees with plan.      ____________________________________________   FINAL CLINICAL IMPRESSION(S) / ED DIAGNOSES  Final diagnoses:  Atypical chest pain  Anxiety  Alcohol abuse     ED Discharge Orders    None       Note:  This document  was prepared using Dragon voice recognition software and may include unintentional dictation errors.   Chesley NoonJessup, Zakariah Urwin, MD 02/27/19 1623

## 2019-03-28 ENCOUNTER — Other Ambulatory Visit: Payer: Self-pay

## 2019-03-28 ENCOUNTER — Inpatient Hospital Stay
Admission: AD | Admit: 2019-03-28 | Discharge: 2019-03-30 | DRG: 897 | Disposition: A | Discharge status: Home / Self Care | Payer: No Typology Code available for payment source | Source: Intra-hospital | Attending: Psychiatry | Admitting: Psychiatry

## 2019-03-28 ENCOUNTER — Encounter: Payer: Self-pay | Admitting: Emergency Medicine

## 2019-03-28 ENCOUNTER — Emergency Department
Admission: EM | Admit: 2019-03-28 | Discharge: 2019-03-28 | Disposition: A | Discharge status: Other Acute Inpatient Hospital | Payer: Self-pay | Attending: Emergency Medicine | Admitting: Emergency Medicine

## 2019-03-28 DIAGNOSIS — I10 Essential (primary) hypertension: Secondary | ICD-10-CM | POA: Diagnosis present

## 2019-03-28 DIAGNOSIS — F17210 Nicotine dependence, cigarettes, uncomplicated: Secondary | ICD-10-CM | POA: Diagnosis present

## 2019-03-28 DIAGNOSIS — Z20828 Contact with and (suspected) exposure to other viral communicable diseases: Secondary | ICD-10-CM | POA: Insufficient documentation

## 2019-03-28 DIAGNOSIS — F1010 Alcohol abuse, uncomplicated: Secondary | ICD-10-CM

## 2019-03-28 DIAGNOSIS — K219 Gastro-esophageal reflux disease without esophagitis: Secondary | ICD-10-CM | POA: Diagnosis present

## 2019-03-28 DIAGNOSIS — F410 Panic disorder [episodic paroxysmal anxiety] without agoraphobia: Secondary | ICD-10-CM

## 2019-03-28 DIAGNOSIS — Y908 Blood alcohol level of 240 mg/100 ml or more: Secondary | ICD-10-CM | POA: Insufficient documentation

## 2019-03-28 DIAGNOSIS — F10229 Alcohol dependence with intoxication, unspecified: Secondary | ICD-10-CM | POA: Diagnosis present

## 2019-03-28 DIAGNOSIS — F10929 Alcohol use, unspecified with intoxication, unspecified: Secondary | ICD-10-CM | POA: Insufficient documentation

## 2019-03-28 DIAGNOSIS — F329 Major depressive disorder, single episode, unspecified: Secondary | ICD-10-CM | POA: Insufficient documentation

## 2019-03-28 DIAGNOSIS — Z79899 Other long term (current) drug therapy: Secondary | ICD-10-CM | POA: Insufficient documentation

## 2019-03-28 LAB — COMPREHENSIVE METABOLIC PANEL
ALT: 35 U/L (ref 0–44)
AST: 58 U/L — ABNORMAL HIGH (ref 15–41)
Albumin: 3.4 g/dL — ABNORMAL LOW (ref 3.5–5.0)
Alkaline Phosphatase: 74 U/L (ref 38–126)
Anion gap: 11 (ref 5–15)
BUN: 16 mg/dL (ref 6–20)
CO2: 27 mmol/L (ref 22–32)
Calcium: 8.6 mg/dL — ABNORMAL LOW (ref 8.9–10.3)
Chloride: 97 mmol/L — ABNORMAL LOW (ref 98–111)
Creatinine, Ser: 0.87 mg/dL (ref 0.61–1.24)
GFR calc Af Amer: >60 mL/min (ref >60)
GFR calc non Af Amer: >60 mL/min (ref >60)
Glucose, Bld: 139 mg/dL — ABNORMAL HIGH (ref 70–99)
Potassium: 4 mmol/L (ref 3.5–5.1)
Sodium: 135 mmol/L (ref 135–145)
Total Bilirubin: 0.7 mg/dL (ref 0.3–1.2)
Total Protein: 6.6 g/dL (ref 6.5–8.1)

## 2019-03-28 LAB — URINALYSIS, COMPLETE (UACMP) WITH MICROSCOPIC
Bacteria, UA: NONE SEEN
Bilirubin Urine: NEGATIVE
Glucose, UA: NEGATIVE mg/dL
Hgb urine dipstick: NEGATIVE
Ketones, ur: NEGATIVE mg/dL
Leukocytes,Ua: NEGATIVE
Nitrite: NEGATIVE
Protein, ur: NEGATIVE mg/dL
Specific Gravity, Urine: 1.012 (ref 1.005–1.030)
Squamous Epithelial / HPF: NONE SEEN (ref 0–5)
pH: 6 (ref 5.0–8.0)

## 2019-03-28 LAB — CBC WITH DIFFERENTIAL/PLATELET
Abs Immature Granulocytes: 0.01 10*3/uL (ref 0.00–0.07)
Basophils Absolute: 0.1 10*3/uL (ref 0.0–0.1)
Basophils Relative: 1 %
Eosinophils Absolute: 0.1 10*3/uL (ref 0.0–0.5)
Eosinophils Relative: 2 %
HCT: 44.7 % (ref 39.0–52.0)
Hemoglobin: 16 g/dL (ref 13.0–17.0)
Immature Granulocytes: 0 %
Lymphocytes Relative: 54 %
Lymphs Abs: 2.8 10*3/uL (ref 0.7–4.0)
MCH: 30.5 pg (ref 26.0–34.0)
MCHC: 35.8 g/dL (ref 30.0–36.0)
MCV: 85.1 fL (ref 80.0–100.0)
Monocytes Absolute: 0.7 10*3/uL (ref 0.1–1.0)
Monocytes Relative: 13 %
Neutro Abs: 1.6 10*3/uL — ABNORMAL LOW (ref 1.7–7.7)
Neutrophils Relative %: 30 %
Platelets: 250 10*3/uL (ref 150–400)
RBC: 5.25 MIL/uL (ref 4.22–5.81)
RDW: 11.9 % (ref 11.5–15.5)
WBC: 5.2 10*3/uL (ref 4.0–10.5)
nRBC: 0 % (ref 0.0–0.2)

## 2019-03-28 LAB — URINE DRUG SCREEN, QUALITATIVE (ARMC ONLY)
Amphetamines, Ur Screen: NOT DETECTED
Barbiturates, Ur Screen: NOT DETECTED
Benzodiazepine, Ur Scrn: NOT DETECTED
Cannabinoid 50 Ng, Ur ~~LOC~~: NOT DETECTED
Cocaine Metabolite,Ur ~~LOC~~: NOT DETECTED
MDMA (Ecstasy)Ur Screen: NOT DETECTED
Methadone Scn, Ur: NOT DETECTED
Opiate, Ur Screen: NOT DETECTED
Phencyclidine (PCP) Ur S: NOT DETECTED
Tricyclic, Ur Screen: NOT DETECTED

## 2019-03-28 LAB — SARS CORONAVIRUS 2 BY RT PCR (HOSPITAL ORDER, PERFORMED IN ~~LOC~~ HOSPITAL LAB): SARS Coronavirus 2: NEGATIVE

## 2019-03-28 LAB — ETHANOL: Alcohol, Ethyl (B): 287 mg/dL — ABNORMAL HIGH (ref <10)

## 2019-03-28 MED ORDER — VITAMIN B-1 100 MG PO TABS
100.0000 mg | ORAL_TABLET | Freq: Every day | ORAL | Status: DC
  Administered 2019-03-29 – 2019-03-30 (×2): 100 mg via ORAL
  Filled 2019-03-28 (×2): qty 1

## 2019-03-28 MED ORDER — ACETAMINOPHEN 325 MG PO TABS: 650.0000 mg | ORAL_TABLET | Freq: Four times a day (QID) | ORAL | Status: DC | PRN

## 2019-03-28 MED ORDER — LORAZEPAM 2 MG PO TABS: 2.0000 mg | ORAL_TABLET | ORAL | Status: DC | PRN

## 2019-03-28 MED ORDER — THIAMINE HCL 100 MG/ML IJ SOLN: 100.0000 mg | Freq: Every day | INTRAMUSCULAR | Status: DC

## 2019-03-28 MED ORDER — LORAZEPAM 2 MG PO TABS: 0.0000 mg | ORAL_TABLET | Freq: Two times a day (BID) | ORAL | Status: DC

## 2019-03-28 MED ORDER — CARVEDILOL 6.25 MG PO TABS
6.2500 mg | ORAL_TABLET | Freq: Two times a day (BID) | ORAL | Status: DC
  Administered 2019-03-29 – 2019-03-30 (×3): 6.25 mg via ORAL
  Filled 2019-03-28 (×3): qty 1

## 2019-03-28 MED ORDER — LORAZEPAM 2 MG/ML IJ SOLN: 0.0000 mg | Freq: Four times a day (QID) | INTRAMUSCULAR | Status: DC

## 2019-03-28 MED ORDER — ESCITALOPRAM OXALATE 10 MG PO TABS
10.0000 mg | ORAL_TABLET | Freq: Every day | ORAL | Status: DC
  Administered 2019-03-29: 10 mg via ORAL
  Filled 2019-03-28: qty 1

## 2019-03-28 MED ORDER — LORAZEPAM 2 MG/ML IJ SOLN: 0.0000 mg | Freq: Two times a day (BID) | INTRAMUSCULAR | Status: DC

## 2019-03-28 MED ORDER — LORAZEPAM 2 MG PO TABS
2.0000 mg | ORAL_TABLET | Freq: Once | ORAL | Status: AC
  Administered 2019-03-28: 2 mg via ORAL
  Filled 2019-03-28: qty 1

## 2019-03-28 MED ORDER — MAGNESIUM HYDROXIDE 400 MG/5ML PO SUSP: 30.0000 mL | Freq: Every day | ORAL | Status: DC | PRN

## 2019-03-28 MED ORDER — LORAZEPAM 2 MG PO TABS: 0.0000 mg | ORAL_TABLET | Freq: Four times a day (QID) | ORAL | Status: DC

## 2019-03-28 MED ORDER — LORAZEPAM 2 MG PO TABS
0.0000 mg | ORAL_TABLET | Freq: Four times a day (QID) | ORAL | Status: DC
  Administered 2019-03-29 (×2): 2 mg via ORAL
  Administered 2019-03-29: 1 mg via ORAL
  Administered 2019-03-29 – 2019-03-30 (×2): 2 mg via ORAL
  Administered 2019-03-30: 4 mg via ORAL
  Filled 2019-03-28 (×5): qty 1
  Filled 2019-03-28: qty 2

## 2019-03-28 MED ORDER — ESCITALOPRAM OXALATE 10 MG PO TABS: 10.0000 mg | ORAL_TABLET | Freq: Every day | ORAL | Status: DC

## 2019-03-28 MED ORDER — ALUM & MAG HYDROXIDE-SIMETH 200-200-20 MG/5ML PO SUSP: 30.0000 mL | ORAL | Status: DC | PRN

## 2019-03-28 MED ORDER — LORAZEPAM 2 MG PO TABS
2.0000 mg | ORAL_TABLET | ORAL | Status: DC | PRN
  Administered 2019-03-29 (×2): 2 mg via ORAL
  Filled 2019-03-28 (×2): qty 1

## 2019-03-28 MED ORDER — NICOTINE 21 MG/24HR TD PT24
21.0000 mg | MEDICATED_PATCH | Freq: Once | TRANSDERMAL | Status: DC
  Administered 2019-03-28: 21 mg via TRANSDERMAL
  Filled 2019-03-28: qty 1

## 2019-03-28 MED ORDER — VITAMIN B-1 100 MG PO TABS
100.0000 mg | ORAL_TABLET | Freq: Every day | ORAL | Status: DC
  Administered 2019-03-28: 20:00:00 100 mg via ORAL
  Filled 2019-03-28: qty 1

## 2019-03-28 NOTE — BH Assessment (Signed)
Assessment Note  Zachary Mendoza Hageman. is an 28 y.o. male who presents to ED with increased sxs of anxiety. Pt reports having multiple panic attack today. Pt reports he started drinking to help decrease his anxiety. In addition to anxiety patient is also been experiencing depression symptoms including low mood feelings of hopelessness. Pt recently lost his job a week and a half ago and this has also attributed to his sxs of depression. Pt was alert and oriented x4 with a flat affect. Pt denied SI/HI/AVH.   Diagnosis: Anxiety Disorder, with panic attacks  Past Medical History:  Past Medical History:  Diagnosis Date  . Hypertension     History reviewed. No pertinent surgical history.  Family History: No family history on file.  Social History:  reports that he has been smoking cigarettes. He has been smoking about 1.00 pack per day. He has never used smokeless tobacco. He reports current alcohol use. He reports previous drug use.  Additional Social History:  Alcohol / Drug Use Pain Medications: See MAR Prescriptions: See MAR Over the Counter: See MAR History of alcohol / drug use?: Yes Longest period of sobriety (when/how long): UKN Negative Consequences of Use: Financial, Personal relationships, Work / School Withdrawal Symptoms: Sweats, Cramps, Tremors, Diarrhea Substance #1 Name of Substance 1: Alcohol 1 - Age of First Use: Unable to quantify 1 - Amount (size/oz): Unable to quantify 1 - Frequency: Daily 1 - Duration: Unable to quantify 1 - Last Use / Amount: 03/28/2019  CIWA: CIWA-Ar BP: (!) 151/110 Pulse Rate: (!) 113 Nausea and Vomiting: no nausea and no vomiting Tactile Disturbances: none Tremor: no tremor Auditory Disturbances: not present Paroxysmal Sweats: barely perceptible sweating, palms moist Visual Disturbances: not present Anxiety: moderately anxious, or guarded, so anxiety is inferred Headache, Fullness in Head: very mild Agitation: normal activity Orientation  and Clouding of Sensorium: oriented and can do serial additions CIWA-Ar Total: 6 COWS:    Allergies:  Allergies  Allergen Reactions  . Penicillins Hives    Home Medications: (Not in a hospital admission)   OB/GYN Status:  No LMP for male patient.  General Assessment Data Location of Assessment: [facility] ED TTS Assessment: In system Is this a Tele or Face-to-Face Assessment?: Face-to-Face Is this an Initial Assessment or a Re-assessment for this encounter?: Initial Assessment Patient Accompanied by:: N/A Language Other than English: No Living Arrangements: Other (Comment)(Private Residence) What gender do you identify as?: Male Marital status: Married [place] name: n/A Living Arrangements: Spouse/significant other, Children, Other (Comment)(4 stepchildren) Can pt return to current living arrangement?: Yes Admission Status: Voluntary Is patient capable of signing voluntary admission?: Yes Referral Source: Self/Family/Friend Insurance type: None  Medical Screening Exam [facility] Walk-in ONLY) Medical Exam completed: Yes  Crisis Care Plan Living Arrangements: Spouse/significant other, Children, Other (Comment)(4 stepchildren) Legal Guardian: Other:(Self) Name of Psychiatrist: None Reported Name of Therapist: None Reported  Education Status Is patient currently in school?: No Is the patient employed, unemployed or receiving disability?: Unemployed(Pt lost his job a week and a half ago)  Risk to self with the past 6 months Suicidal Ideation: No Has patient been a risk to self within the past 6 months prior to admission? : No Suicidal Intent: No Has patient had any suicidal intent within the past 6 months prior to admission? : No Is patient at risk for suicide?: No Suicidal Plan?: No Has patient had any suicidal plan within the past 6 months prior to admission? : No Access to Means: No What has been  your use of drugs/alcohol within the last 12 months?: Alcohol Previous  Attempts/Gestures: No How many times?: 0 Other Self Harm Risks: None Triggers for Past Attempts: None known Intentional Self Injurious Behavior: None Family Suicide History: Unknown Recent stressful life event(s): Conflict (Comment), Loss (Comment), Job Loss, Financial Problems Persecutory voices/beliefs?: No Depression: Yes Depression Symptoms: Despondent, Insomnia, Tearfulness, Isolating, Fatigue, Guilt, Loss of interest in usual pleasures, Feeling worthless/self pity Substance abuse history and/or treatment for substance abuse?: Yes Suicide prevention information given to non-admitted patients: Not applicable  Risk to Others within the past 6 months Homicidal Ideation: No Does patient have any lifetime risk of violence toward others beyond the six months prior to admission? : No Thoughts of Harm to Others: No Current Homicidal Intent: No Current Homicidal Plan: No Access to Homicidal Means: No Identified Victim: None History of harm to others?: No Assessment of Violence: None Noted Violent Behavior Description: None Does patient have access to weapons?: No Criminal Charges Pending?: Yes Describe Pending Criminal Charges: Misdemeanor-ASSAULT ON A MALE Does patient have a court date: Yes Court Date: 05/21/19 Is patient on probation?: Unknown  Psychosis Hallucinations: None noted Delusions: None noted  Mental Status Report Appearance/Hygiene: In scrubs Eye Contact: Good Motor Activity: Freedom of movement Speech: Logical/coherent Level of Consciousness: Alert Mood: Depressed, Guilty Affect: Depressed, Flat Anxiety Level: Minimal Thought Processes: Coherent, Relevant Judgement: Partial Orientation: Person, Place, Time, Situation, Appropriate for developmental age Obsessive Compulsive Thoughts/Behaviors: None  Cognitive Functioning Concentration: Normal Memory: Recent Intact, Remote Intact Is patient IDD: No Insight: Fair Impulse Control: Fair Appetite:  Poor Have you had any weight changes? Myer Haff) Sleep: Decreased Total Hours of Sleep: 4 Vegetative Symptoms: None  ADLScreening [facility] Assessment Services) Patient's cognitive ability adequate to safely complete daily activities?: Yes Patient able to express need for assistance with ADLs?: Yes Independently performs ADLs?: Yes (appropriate for developmental age)  Prior Inpatient Therapy Prior Inpatient Therapy: No  Prior Outpatient Therapy Prior Outpatient Therapy: No Does patient have an ACCT team?: No Does patient have Intensive In-House Services?  : No Does patient have [facility] services? : No Does patient have P4CC services?: No  ADL Screening (condition at time of admission) Patient's cognitive ability adequate to safely complete daily activities?: Yes Patient able to express need for assistance with ADLs?: Yes Independently performs ADLs?: Yes (appropriate for developmental age)       Abuse/Neglect Assessment (Assessment to be complete while patient is alone) Abuse/Neglect Assessment Can Be Completed: Yes Physical Abuse: Denies Verbal Abuse: Denies Sexual Abuse: Denies Exploitation of patient/patient's resources: Denies Self-Neglect: Denies Values / Beliefs Cultural Requests During Hospitalization: None Spiritual Requests During Hospitalization: None Consults Spiritual Care Consult Needed: No Social Work Consult Needed: No         Child/Adolescent Assessment Running Away Risk: (Patient is an adult)  Disposition:  Disposition Initial Assessment Completed for this Encounter: Yes Disposition of Patient: Admit Type of inpatient treatment program: Adult Patient refused recommended treatment: No Mode of transportation if patient is discharged/movement?: N/A Patient referred to: Other (Comment)([facility] BMU)  On Site Evaluation by:   Reviewed with Physician:    Frederich Cha 03/28/2019 11:00 PM

## 2019-03-28 NOTE — ED Notes (Signed)
Pt. Laying on bed watching tv.  Pt. Calm and cooperative at this time.  Pt. States still feeling some anxiety, pt. Believes medication given(ativan 2 mg) has helped.

## 2019-03-28 NOTE — ED Notes (Signed)
Reports he has been drinking a lot over past week, seeking care due to abdominal pain and anxiety. Reports panic attack. Denies SI or HI.

## 2019-03-28 NOTE — BH Assessment (Addendum)
Patient is to be admitted to [facility] BMU by [place].  Attending Physician will be Dr. Weber Cooks.   Patient has been assigned to room 306, by [place] Nurse Charlett Nose.   Intake Paper Work has been signed and placed on patient chart.   ER staff is aware of the admission:  Jonelle Sidle, ER Secretary    Dr. Cinda Quest, ER MD   Dyke Maes, Patient's Nurse   Sharyn Lull, Patient Access.

## 2019-03-28 NOTE — ED Triage Notes (Signed)
Pt reports when he wakes up he has no energy. Pt states he may be depressed because he was laid off for the pandemic and has been trying hard to make ends meet.

## 2019-03-28 NOTE — ED Notes (Signed)
ED Provider at bedside. 

## 2019-03-28 NOTE — ED Notes (Signed)
Requested patient to give urine sample, states he is unable to at this time. Encouraged and given labeled urine cup.  Report to Anda Kraft, [place]

## 2019-03-28 NOTE — ED Notes (Signed)
Changed patient into burgundy shirt and paper pants for quad  Patient's belongings collected:  Pair of rubber boots Sweatpants T shirt Empty carton of cigarettes Book of matches

## 2019-03-28 NOTE — ED Provider Notes (Addendum)
[facility] Emergency Department Provider Note       Time seen: ----------------------------------------- 2:42 PM on 03/28/2019 -----------------------------------------   I have reviewed the triage vital signs and the nursing notes.  HISTORY   Chief Complaint Alcohol Intoxication    HPI Zachary L Sahota Montez Hageman. is a 28 y.o. male with a history of hypertension who presents to the ED for depression, alcohol abuse and anxiety.  Patient reports the medications he is taking orally which include hydroxyzine and gabapentin are making his anxiety attacks worse.  Patient states he has been out of work for a couple of weeks and has been drinking significantly.  Patient feels like he is also depressed.  Past Medical History:  Diagnosis Date  . Hypertension     There are no active problems to display for this patient.   History reviewed. No pertinent surgical history.  Allergies Penicillins  Social History Social History   Tobacco Use  . Smoking status: Current Every Day Smoker    Packs/day: 1.00    Types: Cigarettes  . Smokeless tobacco: Never Used  Substance Use Topics  . Alcohol use: Yes    Comment: daily  . Drug use: Not Currently    Comment: heroin multiple times a week   Review of Systems Constitutional: Negative for fever. Cardiovascular: Negative for chest pain. Respiratory: Negative for shortness of breath. Gastrointestinal: Negative for abdominal pain, vomiting and diarrhea. Musculoskeletal: Negative for back pain. Skin: Negative for rash. Neurological: Negative for headaches, focal weakness or numbness. Psychiatric: Positive for alcohol abuse, depression, anxiety  All systems negative/normal/unremarkable except as stated in the HPI  ____________________________________________   PHYSICAL EXAM:  VITAL SIGNS: ED Triage Vitals  Enc Vitals Group     BP 03/28/19 1415 (!) 149/113     Pulse Rate 03/28/19 1415 (!) 109     Resp 03/28/19  1415 20     Temp 03/28/19 1415 98 F (36.7 C)     Temp Source 03/28/19 1415 Oral     SpO2 03/28/19 1415 97 %     Weight 03/28/19 1412 225 lb (102.1 kg)     Height 03/28/19 1412 5\' 11"  (1.803 m)     Head Circumference --      Peak Flow --      Pain Score 03/28/19 1411 0     Pain Loc --      Pain Edu? --      Excl. in GC? --    Constitutional: Alert and oriented.  Intoxicated appearing, no distress Eyes: Conjunctivae are normal. Normal extraocular movements. ENT      Head: Normocephalic and atraumatic.      Nose: No congestion/rhinnorhea.      Mouth/Throat: Mucous membranes are moist.      Neck: No stridor. Cardiovascular: Normal rate, regular rhythm. No murmurs, rubs, or gallops. Respiratory: Normal respiratory effort without tachypnea nor retractions. Breath sounds are clear and equal bilaterally. No wheezes/rales/rhonchi. Gastrointestinal: Soft and nontender. Normal bowel sounds Musculoskeletal: Nontender with normal range of motion in extremities. No lower extremity tenderness nor edema. Neurologic:  Normal speech and language. No gross focal neurologic deficits are appreciated.  Skin:  Skin is warm, dry and intact. No rash noted. Psychiatric: Depressed mood and affect ____________________________________________  ED COURSE:  As part of my medical decision making, I reviewed the following data within the electronic MEDICAL RECORD NUMBER History obtained from family if available, nursing notes, old chart and ekg, as well as notes from prior ED visits. Patient  presented for depression with anxiety and alcohol abuse, we will assess with labs as indicated at this time.   Procedures  Zachary L Pottenger Jr. was evaluated in Emergency Department on 03/28/2019 for the symptoms described in the history of present illness. He was evaluated in the context of the global COVID-19 pandemic, which necessitated consideration that the patient might be at risk for infection with the SARS-CoV-2 virus that  causes COVID-19. Institutional protocols and algorithms that pertain to the evaluation of patients at risk for COVID-19 are in a state of rapid change based on information released by regulatory bodies including the CDC and federal and state organizations. These policies and algorithms were followed during the patient's care in the ED.  ____________________________________________   LABS (pertinent positives/negatives)  Labs Reviewed  CBC WITH DIFFERENTIAL/PLATELET - Abnormal; Notable for the following components:      Result Value   Neutro Abs 1.6 (*)    All other components within normal limits  COMPREHENSIVE METABOLIC PANEL - Abnormal; Notable for the following components:   Chloride 97 (*)    Glucose, Bld 139 (*)    Calcium 8.6 (*)    Albumin 3.4 (*)    AST 58 (*)    All other components within normal limits  ETHANOL - Abnormal; Notable for the following components:   Alcohol, Ethyl (B) 287 (*)    All other components within normal limits  URINALYSIS, COMPLETE (UACMP) WITH MICROSCOPIC  URINE DRUG SCREEN, QUALITATIVE ([facility] ONLY)  ___________________________________________   DIFFERENTIAL DIAGNOSIS   Alcohol intoxication, depression, anxiety, substance abuse  FINAL ASSESSMENT AND PLAN  Alcohol intoxication, depression   Plan: The patient had presented for help with anxiety, depression and alcohol abuse. Patient's labs do indicate significant alcohol intoxication with a level of 287.  Otherwise he appears medically clear for psychiatric evaluation and disposition.   Laurence Aly, MD    Note: This note was generated in part or whole with voice recognition software. Voice recognition is usually quite accurate but there are transcription errors that can and very often do occur. I apologize for any typographical errors that were not detected and corrected.     Earleen Newport, MD 03/28/19 1444    Earleen Newport, MD 03/28/19 1515

## 2019-03-28 NOTE — ED Triage Notes (Signed)
Pt states, "I have been out of work for a couple of weeks and I have been drinking". Pt states his aunt is rich and she agreed to take care of his bills if he would get help. Pt denies SI/HI.

## 2019-03-28 NOTE — ED Notes (Signed)
Pt. States increased alcohol consumption in the past couple weeks.  Pt. States was treating his anxiety with alcohol, but states "that's not working anymore".  Pt. Denies SI and HI.  States stable relationship with wife at home.  Financial troubles to lay off from job.

## 2019-03-28 NOTE — ED Notes (Signed)
VOl/Consult completed plan to Admit to BMU

## 2019-03-28 NOTE — ED Notes (Signed)
Psych at bedside.

## 2019-03-28 NOTE — Consult Note (Signed)
[facility] Face-to-Face Psychiatry Consult   Reason for Consult: Anxiety and depression Referring Physician: Daryel November Patient Identification: Zachary Anchors Sidman Jr. MRN:  [id number] Principal Diagnosis: <principal problem not specified> Diagnosis:  Active Problems:   * No active hospital problems. *   Total Time spent with patient: 45 minutes  Subjective:   Zachary L Zentner Montez Hageman. is a 28 y.o. male patient who presented with anxiety and panic attacks.  HPI: Patient is a 28 year old male who presents with anxiety and panic attacks.  Patient states that he had a panic attack today and took the hydroxyzine his primary care doctor had given him for it however felt much worse, started drinking again and came to the emergency room.  Patient states that he has intractable anxiety that he is only been able to be stable by drinking alcohol.  Patient states that he has been drinking for quite some time.  But feels that he recently has gotten out of hand.  Patient states that approximately 3 weeks ago he went to his doctor to get medications to assist with anxiety and hopes to limit his drinking.  Patient states he started to feel the negative effects of drinking including sluggishness as well as abdominal upset.  Patient states that when he drinks he just feels dizzy now it is not even working for his anxiety anymore.  Patient was given hydroxyzine and gabapentin which she says are not working properly and not adequately treating his anxiety.  In addition to anxiety patient is also been experiencing depression symptoms including low mood feelings of hopelessness.  Patient describes having a rough childhood but states that his current relationship with his wife is secure.  Patient is facing some financial stress as his job recently him off a week and a half ago however patient remains hopeful that he can find work once feeling better.  Patient states that his predominant symptom is low energy he feels tired immediately  after waking up in the morning.  Patient also endorses poor sleep and poor appetite.  Patient states that he is willing to come in for treatment of his depression and anxiety.  Patient denies any hallucinations.  Patient denies any suicidal ideation.  Patient states he has been drinking up to 7 pints of 12% alcohol per day for the last week and a half.   Past Psychiatric History: Patient denies any previous psychiatric hospitalizations.  Patient denies ever speaking to a psychiatrist before.  Patient acknowledges a history of crack cocaine abuse for 2 years approximately 12 years ago.  Patient states he has not had any issues with the substance since that time.  Patient states the only issues he has had recently has been with alcohol.  Risk to Self:  No Risk to Others:  No Prior Inpatient Therapy:  No Prior Outpatient Therapy:  No  Past Medical History:  Past Medical History:  Diagnosis Date  . Hypertension    History reviewed. No pertinent surgical history. Family History: No family history on file. Family Psychiatric  History: Patient states multiple family members have had to take psychiatric medications, specifically antidepressants Social History:  Social History   Substance and Sexual Activity  Alcohol Use Yes   Comment: daily     Social History   Substance and Sexual Activity  Drug Use Not Currently   Comment: heroin multiple times a week    Social History   Socioeconomic History  . Marital status: Single    Spouse name: Not on file  .  Number of children: Not on file  . Years of education: Not on file  . Highest education level: Not on file  Occupational History  . Not on file  Social Needs  . Financial resource strain: Not on file  . Food insecurity    Worry: Not on file    Inability: Not on file  . Transportation needs    Medical: Not on file    Non-medical: Not on file  Tobacco Use  . Smoking status: Current Every Day Smoker    Packs/day: 1.00    Types:  Cigarettes  . Smokeless tobacco: Never Used  Substance and Sexual Activity  . Alcohol use: Yes    Comment: daily  . Drug use: Not Currently    Comment: heroin multiple times a week  . Sexual activity: Not on file  Lifestyle  . Physical activity    Days per week: Not on file    Minutes per session: Not on file  . Stress: Not on file  Relationships  . Social Musicianconnections    Talks on phone: Not on file    Gets together: Not on file    Attends religious service: Not on file    Active member of club or organization: Not on file    Attends meetings of clubs or organizations: Not on file    Relationship status: Not on file  Other Topics Concern  . Not on file  Social History Narrative  . Not on file   Additional Social History: Patient originally worked for [employer].  He is now working as an Personnel officerelectrician.  He has a wife and 4 stepchildren.    Allergies:   Allergies  Allergen Reactions  . Penicillins Hives    Labs:  Results for orders placed or performed during the hospital encounter of 03/28/19 (from the past 48 hour(s))  CBC with Differential/Platelet     Status: Abnormal   Collection Time: 03/28/19  2:42 PM  Result Value Ref Range   WBC 5.2 4.0 - 10.5 K/uL   RBC 5.25 4.22 - 5.81 MIL/uL   Hemoglobin 16.0 13.0 - 17.0 g/dL   HCT 16.144.7 09.639.0 - 04.552.0 %   MCV 85.1 80.0 - 100.0 fL   MCH 30.5 26.0 - 34.0 pg   MCHC 35.8 30.0 - 36.0 g/dL   RDW 40.911.9 81.111.5 - 91.415.5 %   Platelets 250 150 - 400 K/uL   nRBC 0.0 0.0 - 0.2 %   Neutrophils Relative % 30 %   Neutro Abs 1.6 (L) 1.7 - 7.7 K/uL   Lymphocytes Relative 54 %   Lymphs Abs 2.8 0.7 - 4.0 K/uL   Monocytes Relative 13 %   Monocytes Absolute 0.7 0.1 - 1.0 K/uL   Eosinophils Relative 2 %   Eosinophils Absolute 0.1 0.0 - 0.5 K/uL   Basophils Relative 1 %   Basophils Absolute 0.1 0.0 - 0.1 K/uL   Immature Granulocytes 0 %   Abs Immature Granulocytes 0.01 0.00 - 0.07 K/uL    Comment: Performed at [facility], [address] [place], [place] [address]  Comprehensive metabolic panel     Status: Abnormal   Collection Time: 03/28/19  2:42 PM  Result Value Ref Range   Sodium 135 135 - 145 mmol/L   Potassium 4.0 3.5 - 5.1 mmol/L   Chloride 97 (L) 98 - 111 mmol/L   CO2 27 22 - 32 mmol/L   Glucose, Bld 139 (H) 70 - 99 mg/dL   BUN 16  6 - 20 mg/dL   Creatinine, Ser 0.98 0.61 - 1.24 mg/dL   Calcium 8.6 (L) 8.9 - 10.3 mg/dL   Total Protein 6.6 6.5 - 8.1 g/dL   Albumin 3.4 (L) 3.5 - 5.0 g/dL   AST 58 (H) 15 - 41 U/L   ALT 35 0 - 44 U/L   Alkaline Phosphatase 74 38 - 126 U/L   Total Bilirubin 0.7 0.3 - 1.2 mg/dL   GFR calc non Af Amer >60 >60 mL/min   GFR calc Af Amer >60 >60 mL/min   Anion gap 11 5 - 15    Comment: Performed at [facility], [address] [place], [place] [address]  Urinalysis, Complete w Microscopic     Status: Abnormal   Collection Time: 03/28/19  2:42 PM  Result Value Ref Range   Color, Urine YELLOW (A) YELLOW   APPearance CLEAR (A) CLEAR   Specific Gravity, Urine 1.012 1.005 - 1.030   pH 6.0 5.0 - 8.0   Glucose, UA NEGATIVE NEGATIVE mg/dL   Hgb urine dipstick NEGATIVE NEGATIVE   Bilirubin Urine NEGATIVE NEGATIVE   Ketones, ur NEGATIVE NEGATIVE mg/dL   Protein, ur NEGATIVE NEGATIVE mg/dL   Nitrite NEGATIVE NEGATIVE   Leukocytes,Ua NEGATIVE NEGATIVE   WBC, UA 0-5 0 - 5 WBC/hpf   Bacteria, UA NONE SEEN NONE SEEN   Squamous Epithelial / LPF NONE SEEN 0 - 5    Comment: Performed at [facility], [address], [place], [place] [address]  Urine Drug Screen, Qualitative ([facility] only)     Status: None   Collection Time: 03/28/19  2:42 PM  Result Value Ref Range   Tricyclic, Ur Screen NONE DETECTED NONE DETECTED   Amphetamines, Ur Screen NONE DETECTED NONE DETECTED   MDMA (Ecstasy)Ur Screen NONE DETECTED NONE DETECTED   Cocaine Metabolite,Ur [place] NONE DETECTED NONE DETECTED   Opiate, Ur Screen NONE DETECTED NONE DETECTED   Phencyclidine (PCP) Ur S NONE DETECTED NONE  DETECTED   Cannabinoid 50 Ng, Ur [place] NONE DETECTED NONE DETECTED   Barbiturates, Ur Screen NONE DETECTED NONE DETECTED   Benzodiazepine, Ur Scrn NONE DETECTED NONE DETECTED   Methadone Scn, Ur NONE DETECTED NONE DETECTED    Comment: (NOTE) Tricyclics + metabolites, urine    Cutoff 1000 ng/mL Amphetamines + metabolites, urine  Cutoff 1000 ng/mL MDMA (Ecstasy), urine              Cutoff 500 ng/mL Cocaine Metabolite, urine          Cutoff 300 ng/mL Opiate + metabolites, urine        Cutoff 300 ng/mL Phencyclidine (PCP), urine         Cutoff 25 ng/mL Cannabinoid, urine                 Cutoff 50 ng/mL Barbiturates + metabolites, urine  Cutoff 200 ng/mL Benzodiazepine, urine              Cutoff 200 ng/mL Methadone, urine                   Cutoff 300 ng/mL The urine drug screen provides only a preliminary, unconfirmed analytical test result and should not be used for non-medical purposes. Clinical consideration and professional judgment should be applied to any positive drug screen result due to possible interfering substances. A more specific alternate chemical method must be used in order to obtain a confirmed analytical result. Gas chromatography / mass spectrometry (GC/MS) is the preferred [employer]  ory method. Performed at [facility], [address] [place], [place] [address]   Ethanol     Status: Abnormal   Collection Time: 03/28/19  2:42 PM  Result Value Ref Range   Alcohol, Ethyl (B) 287 (H) <10 mg/dL    Comment: (NOTE) Lowest detectable limit for serum alcohol is 10 mg/dL. For medical purposes only. Performed at [facility], [address] [place],  [address]     Current Facility-Administered Medications  Medication Dose Route Frequency Provider Last Rate Last Dose  . nicotine (NICODERM CQ - dosed in mg/24 hours) patch 21 mg  21 mg Transdermal Once Nena Polio, MD   21 mg at 03/28/19 1751   Current Outpatient Medications  Medication  Sig Dispense Refill  . carvedilol (COREG) 6.25 MG tablet Take 6.25 mg by mouth 2 (two) times daily with a meal.    . famotidine (PEPCID) 20 MG tablet Take 1 tablet (20 mg total) by mouth 2 (two) times daily. 60 tablet 1  . omeprazole (PRILOSEC OTC) 20 MG tablet Take 20 mg by mouth daily.    . sucralfate (CARAFATE) 1 g tablet Take 1 tablet (1 g total) by mouth 4 (four) times daily. 120 tablet 1    Musculoskeletal: Strength & Muscle Tone: within normal limits Gait & Station: normal Patient leans: N/A  Psychiatric Specialty Exam: Physical Exam  Review of Systems  Constitutional: Positive for diaphoresis. Negative for fever.  HENT: Negative for hearing loss.   Eyes: Negative for blurred vision.  Cardiovascular: Negative for chest pain.  Skin: Negative for rash.  Psychiatric/Behavioral: Positive for depression and substance abuse. Negative for hallucinations, memory loss and suicidal ideas. The patient is nervous/anxious and has insomnia.     Blood pressure (!) 149/113, pulse (!) 109, temperature 98 F (36.7 C), temperature source Oral, resp. rate 20, height 5\' 11"  (1.803 m), weight 102.1 kg, SpO2 97 %.Body mass index is 31.38 kg/m.  General Appearance: Casual  Eye Contact:  Good  Speech:  Clear and Coherent  Volume:  Decreased  Mood:  Anxious and Depressed  Affect:  Congruent and Depressed  Thought Process:  Coherent  Orientation:  Full (Time, Place, and Person)  Thought Content:  Logical  Suicidal Thoughts:  No  Homicidal Thoughts:  No  Memory:  Immediate;   Good  Judgement:  Fair  Insight:  Good  Psychomotor Activity:  Normal  Concentration:  Concentration: Fair  Recall:  [place] of Knowledge:  Fair  Language:  Fair  Akathisia:  No  Handed:  Right  AIMS (if indicated):     Assets:  Communication Skills Desire for Improvement Financial Resources/Insurance Housing Leisure Time Physical Health Resilience Social Support  ADL's:  Intact  Cognition:  WNL  Sleep:         Treatment Plan Summary: Daily contact with patient to assess and evaluate symptoms and progress in treatment   Assessment: 28 year old male with panic attacks anxiety and depression.  Patient has been drinking alcohol as a way to combat his anxiety to poor eval.  Patient will benefit from inpatient hospitalization for safety stabilization and medication management.  Will start patient on Lexapro due to good effects and other family members.  Patient will also be placed on CIWA protocol due to heavy alcohol use.  Diagnosis: Anxiety disorder with panic attacks  Disposition: Recommend psychiatric Inpatient admission when medically cleared.  Dixie Dials, MD 03/28/2019 6:05 PM

## 2019-03-28 NOTE — ED Triage Notes (Signed)
Pt reports take hydroxyzine for panic attacks but they only make it worse.   Pt reports has been drinking beer. States when he wakes he drinks bootleggers and has 1 beer every three hours. Pt reports last drink was an hour ago and it was a regular beer.

## 2019-03-29 ENCOUNTER — Other Ambulatory Visit: Payer: Self-pay

## 2019-03-29 DIAGNOSIS — F1010 Alcohol abuse, uncomplicated: Secondary | ICD-10-CM

## 2019-03-29 LAB — LIPID PANEL
Cholesterol: 360 mg/dL — ABNORMAL HIGH (ref 0–200)
HDL: 25 mg/dL — ABNORMAL LOW (ref >40)
LDL Cholesterol: UNDETERMINED mg/dL (ref 0–99)
Triglycerides: 3046 mg/dL — ABNORMAL HIGH (ref <150)
VLDL: UNDETERMINED mg/dL (ref 0–40)

## 2019-03-29 LAB — LDL CHOLESTEROL, DIRECT: Direct LDL: UNDETERMINED mg/dL (ref 0–99)

## 2019-03-29 LAB — HEPATIC FUNCTION PANEL
ALT: 44 U/L (ref 0–44)
AST: 96 U/L — ABNORMAL HIGH (ref 15–41)
Albumin: 3.6 g/dL (ref 3.5–5.0)
Alkaline Phosphatase: 79 U/L (ref 38–126)
Bilirubin, Direct: <0.1 mg/dL (ref 0.0–0.2)
Total Bilirubin: 1.1 mg/dL (ref 0.3–1.2)
Total Protein: 6.6 g/dL (ref 6.5–8.1)

## 2019-03-29 LAB — TSH: TSH: 3.058 u[IU]/mL (ref 0.350–4.500)

## 2019-03-29 MED ORDER — LISINOPRIL 20 MG PO TABS
10.0000 mg | ORAL_TABLET | Freq: Every day | ORAL | Status: DC
  Administered 2019-03-29 – 2019-03-30 (×3): 10 mg via ORAL
  Filled 2019-03-29 (×3): qty 1

## 2019-03-29 MED ORDER — NICOTINE 21 MG/24HR TD PT24
21.0000 mg | MEDICATED_PATCH | Freq: Every day | TRANSDERMAL | Status: DC
  Administered 2019-03-29 – 2019-03-30 (×2): 21 mg via TRANSDERMAL
  Filled 2019-03-29 (×2): qty 1

## 2019-03-29 NOTE — Progress Notes (Signed)
Pt is 28 years old admitted from ED for alcohol abuse and, depression and anxiety. Patient A/O x 4 with high BP of 154/135, received Ativan 1 mg for CIWA score of 10. Pt  Stated his mother passed three years a go that has gotten him depressed and he been driinking hard liquor since. 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  Pt. Denies SI/ HI/SIB. Pt is noted to be feeling helpless but hopeful to get help from this hospital.

## 2019-03-29 NOTE — BHH Suicide Risk Assessment (Signed)
[facility] Admission Suicide Risk Assessment   Nursing information obtained from:  Patient Demographic factors:  Male, Living alone, Caucasian, Low socioeconomic status Current Mental Status:  NA Loss Factors:  Loss of significant relationship Historical Factors:  NA Risk Reduction Factors:  Responsible for children under 30 years of age  Total Time spent with patient: 1 hour Principal Problem: Alcohol abuse Diagnosis:  Principal Problem:   Alcohol abuse Active Problems:   Alcoholic intoxication with complication ([place])  Subjective Data: Patient seen chart reviewed.  Patient came voluntarily to the hospital because of heavy alcohol abuse and a desire to stop drinking.  No report of any suicidal ideation.  Denies suicidal thoughts.  Describes some symptoms of anxiety and depression but they have all by his report been associated with drinking withdrawal or medications that he was prescribed.  Patient is very cooperative with treatment right now and reports that he is determined to stay off alcohol.  Currently not having any delirium or psychotic symptoms.  Continued Clinical Symptoms:  Alcohol Use Disorder Identification Test Final Score (AUDIT): 28 The "Alcohol Use Disorders Identification Test", Guidelines for Use in Primary Care, Second Edition.  World Pharmacologist [facility]). Score between 0-7:  no or low risk or alcohol related problems. Score between 8-15:  moderate risk of alcohol related problems. Score between 16-19:  high risk of alcohol related problems. Score 20 or above:  warrants further diagnostic evaluation for alcohol dependence and treatment.   CLINICAL FACTORS:   Alcohol/Substance Abuse/Dependencies   Musculoskeletal: Strength & Muscle Tone: within normal limits Gait & Station: normal Patient leans: N/A  Psychiatric Specialty Exam: Physical Exam  Nursing note and vitals reviewed. Constitutional: He appears well-developed and well-nourished.  HENT:  Head:  Normocephalic and atraumatic.  Eyes: Pupils are equal, round, and reactive to light. Conjunctivae are normal.  Neck: Normal range of motion.  Cardiovascular: Regular rhythm and normal heart sounds.  Respiratory: Effort normal. No respiratory distress.  GI: Soft.  Musculoskeletal: Normal range of motion.  Neurological: He is alert.  Skin: Skin is warm and dry.  Psychiatric: Judgment normal. His mood appears anxious. His speech is delayed. He is slowed. Thought content is not paranoid. He expresses no homicidal and no suicidal ideation. He exhibits abnormal recent memory.    Review of Systems  Constitutional: Negative.   HENT: Negative.   Eyes: Negative.   Respiratory: Negative.   Cardiovascular: Negative.   Gastrointestinal: Negative.   Musculoskeletal: Negative.   Skin: Negative.   Neurological: Negative.   Psychiatric/Behavioral: Positive for memory loss and substance abuse. Negative for depression, hallucinations and suicidal ideas. The patient is nervous/anxious and has insomnia.     Blood pressure (!) 159/126, pulse 96, temperature 98.4 F (36.9 C), temperature source Oral, resp. rate 18, height 5' 11.12" (1.806 m), weight 110.9 kg, SpO2 99 %.Body mass index is 33.99 kg/m.  General Appearance: Casual  Eye Contact:  Fair  Speech:  Clear and Coherent  Volume:  Normal  Mood:  Dysphoric  Affect:  Congruent  Thought Process:  Goal Directed  Orientation:  Full (Time, Place, and Person)  Thought Content:  Logical  Suicidal Thoughts:  No  Homicidal Thoughts:  No  Memory:  Immediate;   Fair Recent;   Fair Remote;   Fair  Judgement:  Fair  Insight:  Fair  Psychomotor Activity:  Decreased  Concentration:  Concentration: Fair  Recall:  [employer] of Knowledge:  Fair  Language:  Fair  Akathisia:  No  Handed:  Right  AIMS (if indicated):     Assets:  Desire for Improvement Housing Physical Health  ADL's:  Intact  Cognition:  WNL  Sleep:  Number of Hours: 5       COGNITIVE FEATURES THAT CONTRIBUTE TO RISK:  None    SUICIDE RISK:   Minimal: No identifiable suicidal ideation.  Patients presenting with no risk factors but with morbid ruminations; may be classified as minimal risk based on the severity of the depressive symptoms  PLAN OF CARE: Patient is on detox protocol.  Continue 15-minute checks.  Engage in individual and group supportive and educational therapy.  Patient has already met with a representative from [place].  Once he is detoxed we will review options for further substance abuse treatment.  Reassess mood as necessary.  I certify that inpatient services furnished can reasonably be expected to improve the patient's condition.   Alethia Berthold, MD 03/29/2019, 3:28 PM

## 2019-03-29 NOTE — BHH Suicide Risk Assessment (Signed)
[facility] INPATIENT:  Family/Significant Other Suicide Prevention Education  Suicide Prevention Education:  Patient Refusal for Family/Significant Other Suicide Prevention Education: The patient [place]. has refused to provide written consent for family/significant other to be provided Family/Significant Other Suicide Prevention Education during admission and/or prior to discharge.  Physician notified.  SPE completed with pt, as pt refused to consent to family contact. SPI pamphlet provided to pt and pt was encouraged to share information with support network, ask questions, and talk about any concerns relating to SPE. Pt denies access to guns/firearms and verbalized understanding of information provided. Mobile Crisis information also provided to pt.   [place] MSW LCSW 03/29/2019, 10:13 AM

## 2019-03-29 NOTE — Progress Notes (Signed)
Recreation Therapy Notes   Date: 03/29/2019  Time: 9:30 am   Location: Craft room   Behavioral response: N/A   Intervention Topic: Goals   Discussion/Intervention: Patient did not attend group.   Clinical Observations/Feedback:  Patient did not attend group.   Zachary Mendoza LRT/CTRS        [name] 03/29/2019 11:10 AM

## 2019-03-29 NOTE — Plan of Care (Signed)
Patient unable to  process information given . Remained in room  this shift  resting .  Problem: Education: Goal: Ability to state activities that reduce stress will improve Outcome: Not Progressing   Problem: Coping: Goal: Ability to identify and develop effective coping behavior will improve Outcome: Not Progressing   Problem: Activity: Goal: Will identify at least one activity in which they can participate Outcome: Not Progressing   Problem: Coping: Goal: Coping ability will improve Outcome: Not Progressing Goal: Will verbalize feelings Outcome: Not Progressing   Problem: Health Behavior/Discharge Planning: Goal: Ability to make decisions will improve Outcome: Not Progressing Goal: Compliance with therapeutic regimen will improve Outcome: Not Progressing   Problem: Education: Goal: Knowledge of disease or condition will improve Outcome: Not Progressing Goal: Understanding of discharge needs will improve Outcome: Not Progressing   Problem: Health Behavior/Discharge Planning: Goal: Ability to identify changes in lifestyle to reduce recurrence of condition will improve Outcome: Not Progressing Goal: Identification of resources available to assist in meeting health care needs will improve Outcome: Not Progressing   Problem: Clinical Measurements: Goal: Ability to maintain clinical measurements within normal limits will improve Outcome: Not Progressing Goal: Will remain free from infection Outcome: Not Progressing Goal: Diagnostic test results will improve Outcome: Not Progressing Goal: Respiratory complications will improve Outcome: Not Progressing Goal: Cardiovascular complication will be avoided Outcome: Not Progressing

## 2019-03-29 NOTE — BHH Group Notes (Signed)
Balance In Life 03/29/2019 1PM  Type of Therapy/Topic:  Group Therapy:  Balance in Life  Participation Level:  Did Not Attend  Description of Group:   This group will address the concept of balance and how it feels and looks when one is unbalanced. Patients will be encouraged to process areas in their lives that are out of balance and identify reasons for remaining unbalanced. Facilitators will guide patients in utilizing problem-solving interventions to address and correct the stressor making their life unbalanced. Understanding and applying boundaries will be explored and addressed for obtaining and maintaining a balanced life. Patients will be encouraged to explore ways to assertively make their unbalanced needs known to significant others in their lives, using other group members and facilitator for support and feedback.  Therapeutic Goals: 1. Patient will identify two or more emotions or situations they have that consume much of in their lives. 2. Patient will identify signs/triggers that life has become out of balance:  3. Patient will identify two ways to set boundaries in order to achieve balance in their lives:  4. Patient will demonstrate ability to communicate their needs through discussion and/or role plays  Summary of Patient Progress:    Therapeutic Modalities:   Cognitive Behavioral Therapy Solution-Focused Therapy Assertiveness Training  Lanae Federer T Shaleka Brines, LCSW  

## 2019-03-29 NOTE — Plan of Care (Signed)
  Problem: Education: Goal: Ability to state activities that reduce stress will improve Outcome: Progressing   Problem: Coping: Goal: Ability to identify and develop effective coping behavior will improve Outcome: Progressing   Problem: Self-Concept: Goal: Ability to identify factors that promote anxiety will improve Outcome: Progressing Goal: Level of anxiety will decrease Outcome: Progressing Goal: Ability to modify response to factors that promote anxiety will improve Outcome: Progressing   Problem: Activity: Goal: Will identify at least one activity in which they can participate Outcome: Progressing   Problem: Coping: Goal: Ability to identify and develop effective coping behavior will improve Outcome: Progressing Goal: Ability to interact with others will improve Outcome: Progressing Goal: Demonstration of participation in decision-making regarding own care will improve Outcome: Progressing Goal: Ability to use eye contact when communicating with others will improve Outcome: Progressing   Problem: Health Behavior/Discharge Planning: Goal: Identification of resources available to assist in meeting health care needs will improve Outcome: Progressing   Problem: Self-Concept: Goal: Will verbalize positive feelings about self Outcome: Progressing   Problem: Education: Goal: Utilization of techniques to improve thought processes will improve Outcome: Progressing Goal: Knowledge of the prescribed therapeutic regimen will improve Outcome: Progressing   Problem: Activity: Goal: Interest or engagement in leisure activities will improve Outcome: Progressing Goal: Imbalance in normal sleep/wake cycle will improve Outcome: Progressing   Problem: Coping: Goal: Coping ability will improve Outcome: Progressing Goal: Will verbalize feelings Outcome: Progressing   Problem: Health Behavior/Discharge Planning: Goal: Ability to make decisions will improve Outcome:  Progressing Goal: Compliance with therapeutic regimen will improve Outcome: Progressing   Problem: Role Relationship: Goal: Will demonstrate positive changes in social behaviors and relationships Outcome: Progressing   Problem: Safety: Goal: Ability to disclose and discuss suicidal ideas will improve Outcome: Progressing Goal: Ability to identify and utilize support systems that promote safety will improve Outcome: Progressing   Problem: Self-Concept: Goal: Will verbalize positive feelings about self Outcome: Progressing Goal: Level of anxiety will decrease Outcome: Progressing   Problem: Education: Goal: Knowledge of disease or condition will improve Outcome: Progressing Goal: Understanding of discharge needs will improve Outcome: Progressing   Problem: Health Behavior/Discharge Planning: Goal: Ability to identify changes in lifestyle to reduce recurrence of condition will improve Outcome: Progressing Goal: Identification of resources available to assist in meeting health care needs will improve Outcome: Progressing   Problem: Physical Regulation: Goal: Complications related to the disease process, condition or treatment will be avoided or minimized Outcome: Progressing   Problem: Safety: Goal: Ability to remain free from injury will improve Outcome: Progressing   Problem: Health Behavior/Discharge Planning: Goal: Ability to manage health-related needs will improve Outcome: Progressing   Problem: Clinical Measurements: Goal: Ability to maintain clinical measurements within normal limits will improve Outcome: Progressing Goal: Will remain free from infection Outcome: Progressing Goal: Diagnostic test results will improve Outcome: Progressing Goal: Respiratory complications will improve Outcome: Progressing Goal: Cardiovascular complication will be avoided Outcome: Progressing
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
## 2019-03-29 NOTE — Tx Team (Signed)
Interdisciplinary Treatment and Diagnostic Plan Update  03/29/2019 Time of Session: 900am Zachary L Kuechle Jr. MRN: [id number]  Principal Diagnosis: <principal problem not specified>  Secondary Diagnoses: Active Problems:   Severe anxiety with panic   Current Medications:  Current Facility-Administered Medications  Medication Dose Route Frequency Provider Last Rate Last Dose  . acetaminophen (TYLENOL) tablet 650 mg  650 mg Oral Q6H PRN Cristofano, Dorene Ar, MD      . alum & mag hydroxide-simeth (MAALOX/MYLANTA) 200-200-20 MG/5ML suspension 30 mL  30 mL Oral Q4H PRN Cristofano, Paul A, MD      . carvedilol (COREG) tablet 6.25 mg  6.25 mg Oral BID WC Cristofano, Dorene Ar, MD   6.25 mg at 03/29/19 0723  . escitalopram (LEXAPRO) tablet 10 mg  10 mg Oral Daily Cristofano, Dorene Ar, MD   10 mg at 03/29/19 0723  . LORazepam (ATIVAN) injection 0-4 mg  0-4 mg Intravenous Q6H Cristofano, Dorene Ar, MD       Or  . LORazepam (ATIVAN) tablet 0-4 mg  0-4 mg Oral Q6H Cristofano, Dorene Ar, MD   2 mg at 03/29/19 0656  . [START ON 03/31/2019] LORazepam (ATIVAN) injection 0-4 mg  0-4 mg Intravenous Q12H Cristofano, Dorene Ar, MD       Or  . Derrill Memo ON 03/31/2019] LORazepam (ATIVAN) tablet 0-4 mg  0-4 mg Oral Q12H Cristofano, Paul A, MD      . LORazepam (ATIVAN) tablet 2 mg  2 mg Oral Q4H PRN Cristofano, Dorene Ar, MD   2 mg at 03/29/19 0206  . magnesium hydroxide (MILK OF MAGNESIA) suspension 30 mL  30 mL Oral Daily PRN Cristofano, Paul A, MD      . thiamine (VITAMIN B-1) tablet 100 mg  100 mg Oral Daily Cristofano, Paul A, MD   100 mg at 03/29/19 5784   Or  . thiamine (B-1) injection 100 mg  100 mg Intravenous Daily Cristofano, Dorene Ar, MD       PTA Medications: Medications Prior to Admission  Medication Sig Dispense Refill Last Dose  . carvedilol (COREG) 6.25 MG tablet Take 6.25 mg by mouth 2 (two) times daily with a meal.     . famotidine (PEPCID) 20 MG tablet Take 1 tablet (20 mg total) by mouth 2 (two) times daily.  60 tablet 1   . omeprazole (PRILOSEC OTC) 20 MG tablet Take 20 mg by mouth daily.     . sucralfate (CARAFATE) 1 g tablet Take 1 tablet (1 g total) by mouth 4 (four) times daily. 120 tablet 1     Patient Stressors: Loss of mother  Patient Strengths: Ability for insight Communication skills Special hobby/interest Work skills  Treatment Modalities: Medication Management, Group therapy, Case management,  1 to 1 session with clinician, Psychoeducation, Recreational therapy.   Physician Treatment Plan for Primary Diagnosis: <principal problem not specified> Long Term Goal(s):     Short Term Goals:    Medication Management: Evaluate patient's response, side effects, and tolerance of medication regimen.  Therapeutic Interventions: 1 to 1 sessions, Unit Group sessions and Medication administration.  Evaluation of Outcomes: Not Met  Physician Treatment Plan for Secondary Diagnosis: Active Problems:   Severe anxiety with panic  Long Term Goal(s):     Short Term Goals:       Medication Management: Evaluate patient's response, side effects, and tolerance of medication regimen.  Therapeutic Interventions: 1 to 1 sessions, Unit Group sessions and Medication administration.  Evaluation of Outcomes: Not Met   RN  Treatment Plan for Primary Diagnosis: <principal problem not specified> Long Term Goal(s): Knowledge of disease and therapeutic regimen to maintain health will improve  Short Term Goals: Ability to demonstrate self-control, Ability to participate in decision making will improve and Ability to identify and develop effective coping behaviors will improve  Medication Management: RN will administer medications as ordered by provider, will assess and evaluate patient's response and provide education to patient for prescribed medication. RN will report any adverse and/or side effects to prescribing provider.  Therapeutic Interventions: 1 on 1 counseling sessions, Psychoeducation,  Medication administration, Evaluate responses to treatment, Monitor vital signs and CBGs as ordered, Perform/monitor CIWA, COWS, AIMS and Fall Risk screenings as ordered, Perform wound care treatments as ordered.  Evaluation of Outcomes: Not Met   LCSW Treatment Plan for Primary Diagnosis: <principal problem not specified> Long Term Goal(s): Safe transition to appropriate next level of care at discharge, Engage patient in therapeutic group addressing interpersonal concerns.  Short Term Goals: Engage patient in aftercare planning with referrals and resources, Increase ability to appropriately verbalize feelings, Facilitate patient progression through stages of change regarding substance use diagnoses and concerns and Increase skills for wellness and recovery  Therapeutic Interventions: Assess for all discharge needs, 1 to 1 time with Social worker, Explore available resources and support systems, Assess for adequacy in community support network, Educate family and significant other(s) on suicide prevention, Complete Psychosocial Assessment, Interpersonal group therapy.  Evaluation of Outcomes: Not Met   Progress in Treatment: Attending groups: No. Participating in groups: No. Taking medication as prescribed: Yes. Toleration medication: Yes. Family/Significant other contact made: No, will contact:  pt declined collateral contact Patient understands diagnosis: Yes. Discussing patient identified problems/goals with staff: Yes. Medical problems stabilized or resolved: Yes. Denies suicidal/homicidal ideation: Yes. Issues/concerns per patient self-inventory: No. Other: N/A  New problem(s) identified: No, Describe:  none  New Short Term/Long Term Goal(s): Detox, elimination of AVH/symptoms of psychosis, medication management for mood stabilization; elimination of SI thoughts; development of comprehensive mental wellness/sobriety plan.   Patient Goals:  "get to the point where I can go home  and not go get a beer"  Discharge Plan or Barriers: SPE pamphlet, Mobile Crisis information, and AA/NA information provided to patient for additional community support and resources. CSW assessing for appropriate referrals.  Reason for Continuation of Hospitalization: Anxiety Medication stabilization Withdrawal symptoms  Estimated Length of Stay: 5-7 days  Attendees: Patient: Zachary Mendoza 03/29/2019 10:11 AM  Physician: Dr Weber Cooks MD 03/29/2019 10:11 AM  Nursing: Polly Cobia RN 03/29/2019 10:11 AM  Case Manager: Rockne Menghini 03/29/2019 10:11 AM  Social Worker: Minette Brine Leiani Enright LCSW 03/29/2019 10:11 AM  Recreational Therapist: Roanna Epley CTRS LRT 03/29/2019 10:11 AM  Other: Sanjuana Kava LCSW 03/29/2019 10:11 AM  Other: Assunta Curtis LCSW 03/29/2019 10:11 AM  Other: 03/29/2019 10:11 AM    Scribe for Treatment Team: Mariann Laster Ludmilla Mcgillis, LCSW 03/29/2019 10:11 AM

## 2019-03-29 NOTE — Plan of Care (Signed)
Problem: Education: Goal: Ability to state activities that reduce stress will improve 03/29/2019 0131 by Sharia Reeve, RN Outcome: Progressing 03/29/2019 0122 by Sharia Reeve, RN Outcome: Progressing   Problem: Coping: Goal: Ability to identify and develop effective coping behavior will improve 03/29/2019 0131 by Sharia Reeve, RN Outcome: Progressing 03/29/2019 0122 by Sharia Reeve, RN Outcome: Progressing   Problem: Self-Concept: Goal: Ability to identify factors that promote anxiety will improve 03/29/2019 0131 by Sharia Reeve, RN Outcome: Progressing 03/29/2019 0122 by Sharia Reeve, RN Outcome: Progressing Goal: Level of anxiety will decrease 03/29/2019 0131 by Sharia Reeve, RN Outcome: Progressing 03/29/2019 0122 by Sharia Reeve, RN Outcome: Progressing Goal: Ability to modify response to factors that promote anxiety will improve 03/29/2019 0131 by Sharia Reeve, RN Outcome: Progressing 03/29/2019 0122 by Sharia Reeve, RN Outcome: Progressing   Problem: Activity: Goal: Will identify at least one activity in which they can participate 03/29/2019 0131 by Sharia Reeve, RN Outcome: Progressing 03/29/2019 0122 by Sharia Reeve, RN Outcome: Progressing   Problem: Coping: Goal: Ability to identify and develop effective coping behavior will improve 03/29/2019 0131 by Sharia Reeve, RN Outcome: Progressing 03/29/2019 0122 by Sharia Reeve, RN Outcome: Progressing Goal: Ability to interact with others will improve 03/29/2019 0131 by Sharia Reeve, RN Outcome: Progressing 03/29/2019 0122 by Sharia Reeve, RN Outcome: Progressing Goal: Demonstration of participation in decision-making regarding own care will improve 03/29/2019 0131 by Sharia Reeve, RN Outcome: Progressing 03/29/2019 0122 by Sharia Reeve, RN Outcome: Progressing Goal: Ability to use eye contact when communicating with others will improve 03/29/2019 0131  by Sharia Reeve, RN Outcome: Progressing 03/29/2019 0122 by Sharia Reeve, RN Outcome: Progressing   Problem: Health Behavior/Discharge Planning: Goal: Identification of resources available to assist in meeting health care needs will improve 03/29/2019 0131 by Sharia Reeve, RN Outcome: Progressing 03/29/2019 0122 by Sharia Reeve, RN Outcome: Progressing   Problem: Self-Concept: Goal: Will verbalize positive feelings about self 03/29/2019 0131 by Sharia Reeve, RN Outcome: Progressing 03/29/2019 0122 by Sharia Reeve, RN Outcome: Progressing   Problem: Education: Goal: Utilization of techniques to improve thought processes will improve 03/29/2019 0131 by Sharia Reeve, RN Outcome: Progressing 03/29/2019 0122 by Sharia Reeve, RN Outcome: Progressing Goal: Knowledge of the prescribed therapeutic regimen will improve 03/29/2019 0131 by Sharia Reeve, RN Outcome: Progressing 03/29/2019 0122 by Sharia Reeve, RN Outcome: Progressing   Problem: Activity: Goal: Interest or engagement in leisure activities will improve 03/29/2019 0131 by Sharia Reeve, RN Outcome: Progressing 03/29/2019 0122 by Sharia Reeve, RN Outcome: Progressing Goal: Imbalance in normal sleep/wake cycle will improve 03/29/2019 0131 by Sharia Reeve, RN Outcome: Progressing 03/29/2019 0122 by Sharia Reeve, RN Outcome: Progressing   Problem: Coping: Goal: Coping ability will improve 03/29/2019 0131 by Sharia Reeve, RN Outcome: Progressing 03/29/2019 0122 by Sharia Reeve, RN Outcome: Progressing Goal: Will verbalize feelings 03/29/2019 0131 by Sharia Reeve, RN Outcome: Progressing 03/29/2019 0122 by Sharia Reeve, RN Outcome: Progressing   Problem: Health Behavior/Discharge Planning: Goal: Ability to make decisions will improve 03/29/2019 0131 by Sharia Reeve, RN Outcome: Progressing 03/29/2019 0122 by Sharia Reeve, RN Outcome: Progressing Goal:  Compliance with therapeutic regimen will improve 03/29/2019 0131 by Sharia Reeve, RN Outcome: Progressing 03/29/2019 0122 by Sharia Reeve, RN Outcome: Progressing   Problem: Role Relationship: Goal: Will demonstrate positive  changes in social behaviors and relationships 03/29/2019 0131 by Army Chaco, RN Outcome: Progressing 03/29/2019 0122 by Army Chaco, RN Outcome: Progressing   Problem: Safety: Goal: Ability to disclose and discuss suicidal ideas will improve 03/29/2019 0131 by Army Chaco, RN Outcome: Progressing 03/29/2019 0122 by Army Chaco, RN Outcome: Progressing Goal: Ability to identify and utilize support systems that promote safety will improve 03/29/2019 0131 by Army Chaco, RN Outcome: Progressing 03/29/2019 0122 by Army Chaco, RN Outcome: Progressing   Problem: Self-Concept: Goal: Will verbalize positive feelings about self 03/29/2019 0131 by Army Chaco, RN Outcome: Progressing 03/29/2019 0122 by Army Chaco, RN Outcome: Progressing Goal: Level of anxiety will decrease 03/29/2019 0131 by Army Chaco, RN Outcome: Progressing 03/29/2019 0122 by Army Chaco, RN Outcome: Progressing   Problem: Education: Goal: Knowledge of disease or condition will improve 03/29/2019 0131 by Army Chaco, RN Outcome: Progressing 03/29/2019 0122 by Army Chaco, RN Outcome: Progressing Goal: Understanding of discharge needs will improve 03/29/2019 0131 by Army Chaco, RN Outcome: Progressing 03/29/2019 0122 by Army Chaco, RN Outcome: Progressing   Problem: Health Behavior/Discharge Planning: Goal: Ability to identify changes in lifestyle to reduce recurrence of condition will improve 03/29/2019 0131 by Army Chaco, RN Outcome: Progressing 03/29/2019 0122 by Army Chaco, RN Outcome: Progressing Goal: Identification of resources available to assist in meeting health care needs will  improve 03/29/2019 0131 by Army Chaco, RN Outcome: Progressing 03/29/2019 0122 by Army Chaco, RN Outcome: Progressing   Problem: Physical Regulation: Goal: Complications related to the disease process, condition or treatment will be avoided or minimized 03/29/2019 0131 by Army Chaco, RN Outcome: Progressing 03/29/2019 0122 by Army Chaco, RN Outcome: Progressing   Problem: Safety: Goal: Ability to remain free from injury will improve 03/29/2019 0131 by Army Chaco, RN Outcome: Progressing 03/29/2019 0122 by Army Chaco, RN Outcome: Progressing   Problem: Health Behavior/Discharge Planning: Goal: Ability to manage health-related needs will improve 03/29/2019 0131 by Army Chaco, RN Outcome: Progressing 03/29/2019 0122 by Army Chaco, RN Outcome: Progressing   Problem: Clinical Measurements: Goal: Ability to maintain clinical measurements within normal limits will improve 03/29/2019 0131 by Army Chaco, RN Outcome: Progressing 03/29/2019 0122 by Army Chaco, RN Outcome: Progressing Goal: Will remain free from infection 03/29/2019 0131 by Army Chaco, RN Outcome: Progressing 03/29/2019 0122 by Army Chaco, RN Outcome: Progressing Goal: Diagnostic test results will improve 03/29/2019 0131 by Army Chaco, RN Outcome: Progressing 03/29/2019 0122 by Army Chaco, RN Outcome: Progressing Goal: Respiratory complications will improve 03/29/2019 0131 by Army Chaco, RN Outcome: Progressing 03/29/2019 0122 by Army Chaco, RN Outcome: Progressing Goal: Cardiovascular complication will be avoided 03/29/2019 0131 by Army Chaco, RN Outcome: Progressing 03/29/2019 0122 by Army Chaco, RN Outcome: Progressing

## 2019-03-29 NOTE — H&P (Signed)
Psychiatric Admission Assessment Adult  Patient Identification: Zachary L Buchan Jr. MRN:  [id number] Date of Evaluation:  03/29/2019 Chief Complaint:  Anxiety Disorder with Panic Attacks Principal Diagnosis: Alcohol abuse Diagnosis:  Principal Problem:   Alcohol abuse Active Problems:   Alcoholic intoxication with complication ([place])  History of Present Illness: Patient seen and chart reviewed.  Patient presented to the emergency room intoxicated with request for detox.  On interview today he says that he had decided that it was finally time for him to stop drinking.  He had been drinking heavily for the last 3 years and has recognized the damage it has done to his home life.  Because he is currently out of work he thought this would be an opportune time to get detox.  He tells me that at his peak he had been drinking about a case of beer a day but had recently cut it down to about a 12 pack but could not get any lower than that without getting withdrawal symptoms.  Patient says he feels chronically anxious and somewhat depressed but completely denies suicidal thoughts.  Sleep habits are interrupted.  Appetite has been decreased while he has been drinking.  Denies any psychotic symptoms.  He mentions "panic attacks" but makes it clear to me that they never occurred except when he took the medicine hydroxyzine.  Outside of that he denies any panic attacks or chronic anxiety problems. Associated Signs/Symptoms: Depression Symptoms:  anhedonia, psychomotor retardation, fatigue, impaired memory, (Hypo) Manic Symptoms:  Distractibility, Anxiety Symptoms:  Panic Symptoms, Psychotic Symptoms:  None reported PTSD Symptoms: Negative Total Time spent with patient: 1 hour  Past Psychiatric History: No previous psychiatric history.  No previous admission for psychiatric or substance abuse care.  Apparently he went to his primary care doctor asking for assistance to stop drinking and by his report was  prescribed gabapentin and hydroxyzine.  He did not find these medicines to be helpful.  No history of suicide attempts or violence.  Is the patient at risk to self? Yes.    Has the patient been a risk to self in the past 6 months? Yes.    Has the patient been a risk to self within the distant past? Yes.    Is the patient a risk to others? No.  Has the patient been a risk to others in the past 6 months? No.  Has the patient been a risk to others within the distant past? No.   Prior Inpatient Therapy:   Prior Outpatient Therapy:    Alcohol Screening: 1. How often do you have a drink containing alcohol?: 4 or more times a week 2. How many drinks containing alcohol do you have on a typical day when you are drinking?: 3 or 4 3. How often do you have six or more drinks on one occasion?: Daily or almost daily AUDIT-C Score: 9 4. How often during the last year have you found that you were not able to stop drinking once you had started?: Daily or almost daily 5. How often during the last year have you failed to do what was normally expected from you becasue of drinking?: Weekly 6. How often during the last year have you needed a first drink in the morning to get yourself going after a heavy drinking session?: Weekly 7. How often during the last year have you had a feeling of guilt of remorse after drinking?: Daily or almost daily 8. How often during the last year have you been  unable to remember what happened the night before because you had been drinking?: Weekly 9. Have you or someone else been injured as a result of your drinking?: No 10. Has a relative or friend or a doctor or another health worker been concerned about your drinking or suggested you cut down?: Yes, but not in the last year Alcohol Use Disorder Identification Test Final Score (AUDIT): 28 Alcohol Brief Interventions/Follow-up: Alcohol Education, Medication Offered/Prescribed, AUDIT Score <7 follow-up not indicated, Continued  Monitoring Substance Abuse History in the last 12 months:  Yes.   Consequences of Substance Abuse: Medical Consequences:  Patient looks physically worn out.  Feeling jittery.  Having some withdrawal symptoms.  Sweaty and hypertensive. Previous Psychotropic Medications: No  Psychological Evaluations: No  Past Medical History:  Past Medical History:  Diagnosis Date  . Hypertension    History reviewed. No pertinent surgical history. Family History: History reviewed. No pertinent family history. Family Psychiatric  History: Says there is a family history of depression but not of substance abuse Tobacco Screening: Have you used any form of tobacco in the last 30 days? (Cigarettes, Smokeless Tobacco, Cigars, and/or Pipes): Yes Tobacco use, Select all that apply: cigar use daily Are you interested in Tobacco Cessation Medications?: No, patient refused Social History:  Social History   Substance and Sexual Activity  Alcohol Use Yes   Comment: daily     Social History   Substance and Sexual Activity  Drug Use Not Currently   Comment: heroin multiple times a week    Additional Social History: Marital status: Separated Separated, when?: 3 years ago Are you sexually active?: Yes What is your sexual orientation?: heterosexual Has your sexual activity been affected by drugs, alcohol, medication, or emotional stress?: pt denies Does patient have children?: Yes How many children?: 4 How is patient's relationship with their children?: Pts children are 8, 4, 2, and 95 yo. Pt reports a good relationship with his children                         Allergies:   Allergies  Allergen Reactions  . Penicillins Hives   Lab Results:  Results for orders placed or performed during the hospital encounter of 03/28/19 (from the past 48 hour(s))  Hepatic function panel     Status: Abnormal   Collection Time: 03/29/19  6:54 AM  Result Value Ref Range   Total Protein 6.6 6.5 - 8.1 g/dL     Comment: POST-ULTRACENTRIFUGATION   Albumin 3.6 3.5 - 5.0 g/dL   AST 96 (H) 15 - 41 U/L   ALT 44 0 - 44 U/L   Alkaline Phosphatase 79 38 - 126 U/L   Total Bilirubin 1.1 0.3 - 1.2 mg/dL   Bilirubin, Direct <0.1 0.0 - 0.2 mg/dL   Indirect Bilirubin NOT CALCULATED 0.3 - 0.9 mg/dL    Comment: Performed at [facility], [address] [place], [place] [address]  Lipid panel     Status: Abnormal   Collection Time: 03/29/19  6:54 AM  Result Value Ref Range   Cholesterol 360 (H) 0 - 200 mg/dL   Triglycerides 3,046 (H) <150 mg/dL    Comment: RESULT CONFIRMED BY MANUAL DILUTION DAS   HDL 25 (L) >40 mg/dL   Total CHOL/HDL Ratio NOT REPORTED DUE TO HIGH TRIGLYCERIDES RATIO   VLDL UNABLE TO CALCULATE IF TRIGLYCERIDE OVER 400 mg/dL 0 - 40 mg/dL   LDL Cholesterol UNABLE TO CALCULATE IF TRIGLYCERIDE OVER 400 mg/dL  0 - 99 mg/dL    Comment: Performed at [facility], [address] [place], [place] [address] CORRECTED ON 10/15 AT 1005: PREVIOUSLY REPORTED AS NOT CALCULATED   TSH     Status: None   Collection Time: 03/29/19  6:54 AM  Result Value Ref Range   TSH 3.058 0.350 - 4.500 uIU/mL    Comment: Performed by a 3rd Generation assay with a functional sensitivity of <=0.01 uIU/mL. Performed at [facility], [address] [place], [place] [address]   LDL cholesterol, direct     Status: None   Collection Time: 03/29/19  6:54 AM  Result Value Ref Range   Direct LDL UNABLE TO CALCULATE IF TRIGLYCERIDE IS >1293 mg/dL 0 - 99 mg/dL    Comment: Performed at [facility], [address] [place],  [address]    Blood Alcohol level:  Lab Results  Component Value Date   ETH 287 (H) 03/28/2019   ETH 200 (H) 81/82/9937    Metabolic Disorder Labs:  No results found for: HGBA1C, MPG No results found for: PROLACTIN Lab Results  Component Value Date   CHOL 360 (H) 03/29/2019   TRIG 3,046 (H) 03/29/2019   HDL 25 (L) 03/29/2019   CHOLHDL NOT REPORTED  DUE TO HIGH TRIGLYCERIDES 03/29/2019   VLDL UNABLE TO CALCULATE IF TRIGLYCERIDE OVER 400 mg/dL 03/29/2019   LDLCALC UNABLE TO CALCULATE IF TRIGLYCERIDE OVER 400 mg/dL 03/29/2019    Current Medications: Current Facility-Administered Medications  Medication Dose Route Frequency Provider Last Rate Last Dose  . acetaminophen (TYLENOL) tablet 650 mg  650 mg Oral Q6H PRN Cristofano, Dorene Ar, MD      . alum & mag hydroxide-simeth (MAALOX/MYLANTA) 200-200-20 MG/5ML suspension 30 mL  30 mL Oral Q4H PRN Cristofano, Paul A, MD      . carvedilol (COREG) tablet 6.25 mg  6.25 mg Oral BID WC Cristofano, Dorene Ar, MD   6.25 mg at 03/29/19 0723  . lisinopril (ZESTRIL) tablet 10 mg  10 mg Oral Daily Clapacs, Madie Reno, MD   10 mg at 03/29/19 1307  . LORazepam (ATIVAN) injection 0-4 mg  0-4 mg Intravenous Q6H Cristofano, Dorene Ar, MD       Or  . LORazepam (ATIVAN) tablet 0-4 mg  0-4 mg Oral Q6H Cristofano, Dorene Ar, MD   2 mg at 03/29/19 1201  . [START ON 03/31/2019] LORazepam (ATIVAN) injection 0-4 mg  0-4 mg Intravenous Q12H Cristofano, Dorene Ar, MD       Or  . Derrill Memo ON 03/31/2019] LORazepam (ATIVAN) tablet 0-4 mg  0-4 mg Oral Q12H Cristofano, Paul A, MD      . LORazepam (ATIVAN) tablet 2 mg  2 mg Oral Q4H PRN Cristofano, Dorene Ar, MD   2 mg at 03/29/19 0206  . magnesium hydroxide (MILK OF MAGNESIA) suspension 30 mL  30 mL Oral Daily PRN Cristofano, Paul A, MD      . nicotine (NICODERM CQ - dosed in mg/24 hours) patch 21 mg  21 mg Transdermal Daily Clapacs, Madie Reno, MD   21 mg at 03/29/19 1403  . thiamine (VITAMIN B-1) tablet 100 mg  100 mg Oral Daily Cristofano, Paul A, MD   100 mg at 03/29/19 1696   Or  . thiamine (B-1) injection 100 mg  100 mg Intravenous Daily Cristofano, Dorene Ar, MD       PTA Medications: Medications Prior to Admission  Medication Sig Dispense Refill Last Dose  . carvedilol (COREG) 6.25 MG tablet  Take 6.25 mg by mouth 2 (two) times daily with a meal.     . famotidine (PEPCID) 20 MG tablet Take 1  tablet (20 mg total) by mouth 2 (two) times daily. 60 tablet 1   . omeprazole (PRILOSEC OTC) 20 MG tablet Take 20 mg by mouth daily.     . sucralfate (CARAFATE) 1 g tablet Take 1 tablet (1 g total) by mouth 4 (four) times daily. 120 tablet 1     Musculoskeletal: Strength & Muscle Tone: within normal limits Gait & Station: normal Patient leans: N/A  Psychiatric Specialty Exam: Physical Exam  Nursing note and vitals reviewed. Constitutional: He appears well-developed and well-nourished.  HENT:  Head: Normocephalic and atraumatic.  Eyes: Pupils are equal, round, and reactive to light. Conjunctivae are normal.  Neck: Normal range of motion.  Cardiovascular: Regular rhythm and normal heart sounds.  Respiratory: Effort normal. No respiratory distress.  GI: Soft.  Musculoskeletal: Normal range of motion.  Neurological: He is alert.  Skin: Skin is warm and dry.  Psychiatric: Judgment normal. His mood appears anxious. His speech is delayed. He is slowed. Thought content is not paranoid. He expresses no homicidal and no suicidal ideation. He exhibits abnormal recent memory.    Review of Systems  Constitutional: Negative.   HENT: Negative.   Eyes: Negative.   Respiratory: Negative.   Cardiovascular: Negative.   Gastrointestinal: Negative.   Musculoskeletal: Negative.   Skin: Negative.   Neurological: Negative.   Psychiatric/Behavioral: Positive for substance abuse. Negative for depression, hallucinations, memory loss and suicidal ideas. The patient is nervous/anxious and has insomnia.     Blood pressure (!) 159/126, pulse 96, temperature 98.4 F (36.9 C), temperature source Oral, resp. rate 18, height 5' 11.12" (1.806 m), weight 110.9 kg, SpO2 99 %.Body mass index is 33.99 kg/m.  General Appearance: Casual  Eye Contact:  Fair  Speech:  Clear and Coherent  Volume:  Decreased  Mood:  Anxious  Affect:  Congruent  Thought Process:  Goal Directed  Orientation:  Full (Time, Place, and  Person)  Thought Content:  Logical  Suicidal Thoughts:  No  Homicidal Thoughts:  No  Memory:  Immediate;   Fair Recent;   Fair Remote;   Fair  Judgement:  Fair  Insight:  Fair  Psychomotor Activity:  Tremor  Concentration:  Concentration: Fair  Recall:  [employer] of Knowledge:  Fair  Language:  Fair  Akathisia:  No  Handed:  Right  AIMS (if indicated):     Assets:  Communication Skills Desire for Improvement Housing Physical Health Resilience Social Support  ADL's:  Intact  Cognition:  WNL  Sleep:  Number of Hours: 5    Treatment Plan Summary: Daily contact with patient to assess and evaluate symptoms and progress in treatment, Medication management and Plan Continue detox orders.  I reviewed his labs with him.  We will check a hemoglobin A1c tomorrow morning to follow-up on the elevated blood sugar on admission.  Blood pressure continues to run high which may be due to his withdrawal but may also just be high blood pressure.  In addition to the Coreg I am going to add lisinopril.  All of this was explained to the patient.  Encouraged him to eat and drink normally.  Encouraged him to let staff know if he was having any worsening symptoms.  Patient has already met with the representative of [place].  Likely we will be referring him to outpatient treatment once he is detoxed.  I discontinued the Lexapro as after interviewing him today I do not really see any indication for it.  Observation Level/Precautions:  15 minute checks  Laboratory:  HbAIC  Psychotherapy:    Medications:    Consultations:    Discharge Concerns:    Estimated LOS:  Other:     Physician Treatment Plan for Primary Diagnosis: Alcohol abuse Long Term Goal(s): Improvement in symptoms so as ready for discharge  Short Term Goals: Ability to verbalize feelings will improve and Ability to identify and develop effective coping behaviors will improve  Physician Treatment Plan for Secondary Diagnosis: Principal  Problem:   Alcohol abuse Active Problems:   Alcoholic intoxication with complication ([place])  Long Term Goal(s): Improvement in symptoms so as ready for discharge  Short Term Goals: Ability to maintain clinical measurements within normal limits will improve and Compliance with prescribed medications will improve  I certify that inpatient services furnished can reasonably be expected to improve the patient's condition.    Alethia Berthold, MD 10/15/20203:31 PM

## 2019-03-29 NOTE — Tx Team (Signed)
Initial Treatment Plan 03/29/2019 1:25 AM Javid L Gullett Jr. RXV:[id number]    PATIENT STRESSORS: Loss of mother   PATIENT STRENGTHS: Ability for insight Communication skills Special hobby/interest Work skills   PATIENT IDENTIFIED PROBLEMS: Anxiety  Alcohol Abuse  HTN                 DISCHARGE CRITERIA:  Withdrawal symptoms are absent or subacute and managed without 24-hour nursing intervention  PRELIMINARY DISCHARGE PLAN: Outpatient therapy Return to previous living arrangement  PATIENT/FAMILY INVOLVEMENT: This treatment plan has been presented to and reviewed with the patient, Zachary Mendoza., The patient have been given the opportunity to ask questions and make suggestions.  Army Chaco, RN 03/29/2019, 1:25 AM

## 2019-03-29 NOTE — Progress Notes (Signed)
D: Alcohol Detox A:  Patient experiences withdrawal symptoms . Remain on CIWA and receiving medication . Patient unable to focus this shift . Unable to attend unit programing Unable to focus on  Issues and concerns  That lead to admission .   Affect remains flat and depressed.  Appetite fair  Noted to sleep most of shift . Out of room for medication and meal. Face noted flushed all shift  R; Staff will continue to monitor  Patient behavior

## 2019-03-30 LAB — HEMOGLOBIN A1C
Hgb A1c MFr Bld: 5.9 % — ABNORMAL HIGH (ref 4.8–5.6)
Mean Plasma Glucose: 122.63 mg/dL

## 2019-03-30 MED ORDER — CHLORDIAZEPOXIDE HCL 25 MG PO CAPS
25.0000 mg | ORAL_CAPSULE | Freq: Once | ORAL | Status: AC
  Administered 2019-03-30: 25 mg via ORAL
  Filled 2019-03-30: qty 1

## 2019-03-30 MED ORDER — LISINOPRIL 10 MG PO TABS: 10.0000 mg | ORAL_TABLET | Freq: Every day | ORAL | 1 refills | Status: DC

## 2019-03-30 MED ORDER — CARVEDILOL 6.25 MG PO TABS: 6.2500 mg | ORAL_TABLET | Freq: Two times a day (BID) | ORAL | 1 refills | Status: DC

## 2019-03-30 MED ORDER — FAMOTIDINE 20 MG PO TABS: 20.0000 mg | ORAL_TABLET | Freq: Two times a day (BID) | ORAL | 1 refills | Status: DC

## 2019-03-30 NOTE — Progress Notes (Signed)
Recreation Therapy Notes  Date: 03/30/2019  Time: 9:30 am   Location: Craft room   Behavioral response: N/A   Intervention Topic: Communication  Discussion/Intervention: Patient did not attend group.   Clinical Observations/Feedback:  Patient did not attend group.   Kian Gamarra LRT/CTRS        Suprena Travaglini 03/30/2019 10:58 AM

## 2019-03-30 NOTE — [facility] Suicide Risk Assessment (Signed)
[facility] Discharge Suicide Risk Assessment   Principal Problem: Alcohol abuse Discharge Diagnoses: Principal Problem:   Alcohol abuse Active Problems:   Alcoholic intoxication with complication ([place])   Total Time spent with patient: 30 minutes  Musculoskeletal: Strength & Muscle Tone: within normal limits Gait & Station: normal Patient leans: N/A  Psychiatric Specialty Exam: Review of Systems  Constitutional: Negative.   HENT: Negative.   Eyes: Negative.   Respiratory: Negative.   Cardiovascular: Negative.   Gastrointestinal: Negative.   Musculoskeletal: Negative.   Skin: Negative.   Neurological: Negative.   Psychiatric/Behavioral: Positive for substance abuse. Negative for depression, hallucinations, memory loss and suicidal ideas. The patient is nervous/anxious. The patient does not have insomnia.     Blood pressure (!) 138/114, pulse 86, temperature 98 F (36.7 C), temperature source Oral, resp. rate 18, height 5' 11.12" (1.806 m), weight 110.9 kg, SpO2 100 %.Body mass index is 33.99 kg/m.  General Appearance: Casual  Eye Contact::  Good  Speech:  Normal Rate409  Volume:  Normal  Mood:  Anxious  Affect:  Appropriate  Thought Process:  Goal Directed  Orientation:  Full (Time, Place, and Person)  Thought Content:  Logical  Suicidal Thoughts:  No  Homicidal Thoughts:  No  Memory:  Immediate;   Fair Recent;   Fair Remote;   Fair  Judgement:  Fair  Insight:  Fair  Psychomotor Activity:  Normal  Concentration:  Fair  Recall:  [employer] of [place]  Language: Fair  Akathisia:  No  Handed:  Right  AIMS (if indicated):     Assets:  Desire for Improvement Housing Physical Health Social Support  Sleep:  Number of Hours: 4.5  Cognition: WNL  ADL's:  Intact   Mental Status Per Nursing Assessment::   On Admission:  NA  Demographic Factors:  Male, Caucasian and Living alone  Loss Factors: NA  Historical Factors: NA  Risk Reduction Factors:    Responsible for children under 26 years of age, Sense of responsibility to family, Religious beliefs about death and Positive therapeutic relationship  Continued Clinical Symptoms:  Alcohol/Substance Abuse/Dependencies  Cognitive Features That Contribute To Risk:  None    Suicide Risk:  Minimal: No identifiable suicidal ideation.  Patients presenting with no risk factors but with morbid ruminations; may be classified as minimal risk based on the severity of the depressive symptoms  Follow-up Information    [place] on 04/05/2019.   Why: You are scheduled for a zoom meeting with Sherrian Divers on Thursday, April 05, 2019 at 7am. Thank you. Contact information: [place] [address] [phone]           Plan Of Care/Follow-up recommendations:  Activity:  Activity as tolerated Diet:  Regular diet Other:  Follow-up as directed with [facility]  Alethia Berthold, MD 03/30/2019, 10:06 AM

## 2019-03-30 NOTE — Progress Notes (Signed)
Pt received belongings, prescriptions and d/c packet. Pt denies SI, HI and AVS. Pt was educated on care plan and verbalizes understanding. Collier Bullock RN

## 2019-03-30 NOTE — Progress Notes (Signed)
  [facility] Adult Case Management Discharge Plan :  Will you be returning to the same living situation after discharge:  Yes,  lives alone At discharge, do you have transportation home?: Yes,  pt says his girlfriend will provide transportation Do you have the ability to pay for your medications: No.  Release of information consent forms completed and in the chart;  Patient's signature needed at discharge.  Patient to Follow up at: Follow-up Information    [place] on 04/05/2019.   Why: You are scheduled for a zoom meeting with Sherrian Divers on Thursday, April 05, 2019 at 7am. Thank you. Contact information: [place] [address] [phone]           Next level of care provider has access to [place] and Suicide Prevention discussed: Yes,  with pt; pt declined family contact  Have you used any form of tobacco in the last 30 days? (Cigarettes, Smokeless Tobacco, Cigars, and/or Pipes): Yes  Has patient been referred to the Quitline?: Patient refused referral  Patient has been referred for addiction treatment: N/A  Yvette Rack, LCSW 03/30/2019, 9:36 AM

## 2019-03-30 NOTE — Discharge Summary (Signed)
Physician Discharge Summary Note  Patient:  Zachary Mendoza. is an 28 y.o., male MRN:  [id number] DOB:  06-07-1991 Patient phone:  [phone] (home)  Patient address:   [address] [place] [address],  Total Time spent with patient: 30 minutes  Date of Admission:  03/28/2019 Date of Discharge: March 30, 2019  Reason for Admission: Patient admitted because of presentation to the emergency room intoxicated requesting alcohol detox  Principal Problem: Alcohol abuse Discharge Diagnoses: Principal Problem:   Alcohol abuse Active Problems:   Alcoholic intoxication with complication [facility])   Past Psychiatric History: Patient has no prior history of engagement with substance abuse treatment  Past Medical History:  Past Medical History:  Diagnosis Date  . Hypertension    History reviewed. No pertinent surgical history. Family History: History reviewed. No pertinent family history. Family Psychiatric  History: See previous none reported Social History:  Social History   Substance and Sexual Activity  Alcohol Use Yes   Comment: daily     Social History   Substance and Sexual Activity  Drug Use Not Currently   Comment: heroin multiple times a week    Social History   Socioeconomic History  . Marital status: Single    Spouse name: Not on file  . Number of children: Not on file  . Years of education: Not on file  . Highest education level: Not on file  Occupational History  . Not on file  Social Needs  . Financial resource strain: Not on file  . Food insecurity    Worry: Not on file    Inability: Not on file  . Transportation needs    Medical: Not on file    Non-medical: Not on file  Tobacco Use  . Smoking status: Current Every Day Smoker    Packs/day: 1.00    Types: Cigarettes  . Smokeless tobacco: Never Used  Substance and Sexual Activity  . Alcohol use: Yes    Comment: daily  . Drug use: Not Currently    Comment: heroin multiple times a week  .  Sexual activity: Not on file  Lifestyle  . Physical activity    Days per week: Not on file    Minutes per session: Not on file  . Stress: Not on file  Relationships  . Social Herbalist on phone: Not on file    Gets together: Not on file    Attends religious service: Not on file    Active member of club or organization: Not on file    Attends meetings of clubs or organizations: Not on file    Relationship status: Not on file  Other Topics Concern  . Not on file  Social History Narrative  . Not on file    Hospital Course: Patient was put on detox protocol.  Had elevated blood pressure and subjective feelings of tremulousness and outward appearance of anxiety.  Received detox medicine.  Patient did not have a seizure showed no sign of delirium.  He engaged in treatment appropriately with individual and group counseling and engagement with representatives from [facility].  Patient did not show any dangerous behavior during his hospital stay.  On Friday he was requesting discharge.  I told him that he certainly could still feel anxious tonight and I was concerned that he might drink to deal with it.  I offered him to voluntarily choose to stay in the hospital another day but he does not want to do it and I  do not think that it really justifies forcing him.  I will give him a single dose of Librium to hold him through the day before he is discharged.  Patient has high blood pressure which I suspect is separate from his alcohol withdrawal and will be given prescriptions for blood pressure medicine.  He will be referred to follow-up with [facility].  Supportive counseling done.  No sign of dangerousness at discharge  Physical Findings: AIMS:  , ,  ,  ,    CIWA:  CIWA-Ar Total: 11 COWS:     Musculoskeletal: Strength & Muscle Tone: within normal limits Gait & Station: normal Patient leans: N/A  Psychiatric Specialty Exam: Physical Exam  Nursing note and vitals reviewed. Constitutional: He  appears well-developed and well-nourished.  HENT:  Head: Normocephalic and atraumatic.  Eyes: Pupils are equal, round, and reactive to light. Conjunctivae are normal.  Neck: Normal range of motion.  Cardiovascular: Regular rhythm and normal heart sounds.  Respiratory: Effort normal. No respiratory distress.  GI: Soft.  Musculoskeletal: Normal range of motion.  Neurological: He is alert.  Skin: Skin is warm and dry.  Psychiatric: His speech is normal and behavior is normal. Judgment and thought content normal. His affect is blunt. His speech is not delayed. Cognition and memory are normal.    Review of Systems  Constitutional: Negative.   HENT: Negative.   Eyes: Negative.   Respiratory: Negative.   Cardiovascular: Negative.   Gastrointestinal: Negative.   Musculoskeletal: Negative.   Skin: Negative.   Neurological: Negative.   Psychiatric/Behavioral: Negative.     Blood pressure (!) 138/114, pulse 86, temperature 98 F (36.7 C), temperature source Oral, resp. rate 18, height 5' 11.12" (1.806 m), weight 110.9 kg, SpO2 100 %.Body mass index is 33.99 kg/m.  General Appearance: Casual  Eye Contact:  Good  Speech:  Normal Rate  Volume:  Normal  Mood:  Euthymic  Affect:  Congruent  Thought Process:  Goal Directed  Orientation:  Full (Time, Place, and Person)  Thought Content:  Logical  Suicidal Thoughts:  No  Homicidal Thoughts:  No  Memory:  Immediate;   Fair Recent;   Fair Remote;   Fair  Judgement:  Fair  Insight:  Fair  Psychomotor Activity:  Normal  Concentration:  Concentration: Fair  Recall:  [employer]Fair  Fund of Knowledge:  Fair  Language:  Fair  Akathisia:  No  Handed:  Right  AIMS (if indicated):     Assets:  Desire for Improvement Housing Physical Health  ADL's:  Intact  Cognition:  WNL  Sleep:  Number of Hours: 4.5     Have you used any form of tobacco in the last 30 days? (Cigarettes, Smokeless Tobacco, Cigars, and/or Pipes): Yes  Has this patient used any  form of tobacco in the last 30 days? (Cigarettes, Smokeless Tobacco, Cigars, and/or Pipes) Yes, Yes, A prescription for an FDA-approved tobacco cessation medication was offered at discharge and the patient refused  Blood Alcohol level:  Lab Results  Component Value Date   ETH 287 (H) 03/28/2019   ETH 200 (H) 12/25/2018    Metabolic Disorder Labs:  No results found for: HGBA1C, MPG No results found for: PROLACTIN Lab Results  Component Value Date   CHOL 360 (H) 03/29/2019   TRIG 3,046 (H) 03/29/2019   HDL 25 (L) 03/29/2019   CHOLHDL NOT REPORTED DUE TO HIGH TRIGLYCERIDES 03/29/2019   VLDL UNABLE TO CALCULATE IF TRIGLYCERIDE OVER 400 mg/dL 16/10/960410/15/2020   LDLCALC UNABLE TO CALCULATE  IF TRIGLYCERIDE OVER 400 mg/dL 62/70/3500    See Psychiatric Specialty Exam and Suicide Risk Assessment completed by Attending Physician prior to discharge.  Discharge destination:  Home  Is patient on multiple antipsychotic therapies at discharge:  No   Has Patient had three or more failed trials of antipsychotic monotherapy by history:  No  Recommended Plan for Multiple Antipsychotic Therapies: NA  Discharge Instructions    Diet - low sodium heart healthy   Complete by: As directed    Increase activity slowly   Complete by: As directed      Allergies as of 03/30/2019      Reactions   Penicillins Hives      Medication List    STOP taking these medications   omeprazole 20 MG tablet Commonly known as: PRILOSEC OTC   sucralfate 1 g tablet Commonly known as: Carafate     TAKE these medications     Indication  carvedilol 6.25 MG tablet Commonly known as: COREG Take 1 tablet (6.25 mg total) by mouth 2 (two) times daily with a meal.  Indication: High Blood Pressure Disorder   famotidine 20 MG tablet Commonly known as: Pepcid Take 1 tablet (20 mg total) by mouth 2 (two) times daily.  Indication: Gastroesophageal Reflux Disease   lisinopril 10 MG tablet Commonly known as:  ZESTRIL Take 1 tablet (10 mg total) by mouth daily. Start taking on: March 31, 2019  Indication: High Blood Pressure Disorder      Follow-up Information    [facility] Go on 04/05/2019.   Why: You are scheduled for a zoom meeting with Unk Pinto on Thursday, April 05, 2019 at 7am. Thank you. Contact information: [address] [place] [address] [phone]           Follow-up recommendations:  Activity:  Activity as tolerated Diet:  Regular diet Other:  Follow-up with [facility] as indicated  Comments: Prescriptions for blood pressure medicine at discharge.  No outpatient detox medicines given.  I am never comfortable with that because of the risk that the patient may drink and take the medicines at the same time.  I do not think that he is at high risk for DTs or serious alcohol withdrawal although I have warned him that he may continue to feel tremulous for another day or so.  Signed: Mordecai Rasmussen, MD 03/30/2019, 10:11 AM

## 2019-03-30 NOTE — Plan of Care (Addendum)
Pt rated depression 10/10 and anxiety 8/10. Pt denies SI, HI and AVH. Pt was educated on care plan and verbalized understanding. Collier Bullock RN Problem: Education: Goal: Ability to state activities that reduce stress will improve Outcome: Adequate for Discharge   Problem: Coping: Goal: Ability to identify and develop effective coping behavior will improve Outcome: Adequate for Discharge   Problem: Self-Concept: Goal: Ability to identify factors that promote anxiety will improve Outcome: Adequate for Discharge Goal: Level of anxiety will decrease Outcome: Adequate for Discharge Goal: Ability to modify response to factors that promote anxiety will improve Outcome: Adequate for Discharge   Problem: Activity: Goal: Will identify at least one activity in which they can participate Outcome: Adequate for Discharge   Problem: Coping: Goal: Ability to identify and develop effective coping behavior will improve Outcome: Adequate for Discharge Goal: Ability to interact with others will improve Outcome: Adequate for Discharge Goal: Demonstration of participation in decision-making regarding own care will improve Outcome: Adequate for Discharge Goal: Ability to use eye contact when communicating with others will improve Outcome: Adequate for Discharge   Problem: Health Behavior/Discharge Planning: Goal: Identification of resources available to assist in meeting health care needs will improve Outcome: Adequate for Discharge   Problem: Self-Concept: Goal: Will verbalize positive feelings about self Outcome: Adequate for Discharge   Problem: Education: Goal: Utilization of techniques to improve thought processes will improve Outcome: Adequate for Discharge Goal: Knowledge of the prescribed therapeutic regimen will improve Outcome: Adequate for Discharge   Problem: Activity: Goal: Interest or engagement in leisure activities will improve Outcome: Adequate for Discharge Goal: Imbalance  in normal sleep/wake cycle will improve Outcome: Adequate for Discharge   Problem: Coping: Goal: Coping ability will improve Outcome: Adequate for Discharge Goal: Will verbalize feelings Outcome: Adequate for Discharge   Problem: Health Behavior/Discharge Planning: Goal: Ability to make decisions will improve Outcome: Adequate for Discharge Goal: Compliance with therapeutic regimen will improve Outcome: Adequate for Discharge   Problem: Role Relationship: Goal: Will demonstrate positive changes in social behaviors and relationships Outcome: Adequate for Discharge   Problem: Safety: Goal: Ability to disclose and discuss suicidal ideas will improve Outcome: Adequate for Discharge Goal: Ability to identify and utilize support systems that promote safety will improve Outcome: Adequate for Discharge   Problem: Self-Concept: Goal: Will verbalize positive feelings about self Outcome: Adequate for Discharge Goal: Level of anxiety will decrease Outcome: Adequate for Discharge   Problem: Education: Goal: Knowledge of disease or condition will improve Outcome: Adequate for Discharge Goal: Understanding of discharge needs will improve Outcome: Adequate for Discharge   Problem: Health Behavior/Discharge Planning: Goal: Ability to identify changes in lifestyle to reduce recurrence of condition will improve Outcome: Adequate for Discharge Goal: Identification of resources available to assist in meeting health care needs will improve Outcome: Adequate for Discharge   Problem: Physical Regulation: Goal: Complications related to the disease process, condition or treatment will be avoided or minimized Outcome: Adequate for Discharge   Problem: Safety: Goal: Ability to remain free from injury will improve Outcome: Adequate for Discharge   Problem: Health Behavior/Discharge Planning: Goal: Ability to manage health-related needs will improve Outcome: Adequate for Discharge   Problem:  Clinical Measurements: Goal: Ability to maintain clinical measurements within normal limits will improve Outcome: Adequate for Discharge Goal: Will remain free from infection Outcome: Adequate for Discharge Goal: Diagnostic test results will improve Outcome: Adequate for Discharge Goal: Respiratory complications will improve Outcome: Adequate for Discharge Goal: Cardiovascular complication will be avoided Outcome: Adequate  for Discharge

## 2019-03-30 NOTE — Progress Notes (Signed)
Patient alert and oriented 4, affect is blunted, he appears restless, anxious and fidgety, pacing the unit, he looks tired, blushed in the face, he is currently on CIWA every 6 hours, no tremors, no sweating he was not anxious and restless. Patient was medicated with lorazepam mg for withdrawals. Patient was also offered emotional support and encouragement, he was receptive., 15 minutes safety checks maintained will continue to ,monitor.

## 2019-05-13 ENCOUNTER — Emergency Department
Admission: EM | Admit: 2019-05-13 | Discharge: 2019-05-13 | Disposition: A | Discharge status: Home / Self Care | Payer: Self-pay | Attending: Emergency Medicine | Admitting: Emergency Medicine

## 2019-05-13 ENCOUNTER — Emergency Department: Payer: Self-pay

## 2019-05-13 ENCOUNTER — Other Ambulatory Visit: Payer: Self-pay

## 2019-05-13 DIAGNOSIS — I10 Essential (primary) hypertension: Secondary | ICD-10-CM | POA: Insufficient documentation

## 2019-05-13 DIAGNOSIS — Z79899 Other long term (current) drug therapy: Secondary | ICD-10-CM | POA: Insufficient documentation

## 2019-05-13 DIAGNOSIS — F17210 Nicotine dependence, cigarettes, uncomplicated: Secondary | ICD-10-CM | POA: Insufficient documentation

## 2019-05-13 DIAGNOSIS — F410 Panic disorder [episodic paroxysmal anxiety] without agoraphobia: Secondary | ICD-10-CM | POA: Insufficient documentation

## 2019-05-13 LAB — BASIC METABOLIC PANEL
Anion gap: 15 (ref 5–15)
BUN: 14 mg/dL (ref 6–20)
CO2: 21 mmol/L — ABNORMAL LOW (ref 22–32)
Calcium: 9.2 mg/dL (ref 8.9–10.3)
Chloride: 102 mmol/L (ref 98–111)
Creatinine, Ser: 0.91 mg/dL (ref 0.61–1.24)
GFR calc Af Amer: >60 mL/min (ref >60)
GFR calc non Af Amer: >60 mL/min (ref >60)
Glucose, Bld: 118 mg/dL — ABNORMAL HIGH (ref 70–99)
Potassium: 3.7 mmol/L (ref 3.5–5.1)
Sodium: 138 mmol/L (ref 135–145)

## 2019-05-13 LAB — CBC
HCT: 47 % (ref 39.0–52.0)
Hemoglobin: 15.9 g/dL (ref 13.0–17.0)
MCH: 29.6 pg (ref 26.0–34.0)
MCHC: 33.8 g/dL (ref 30.0–36.0)
MCV: 87.5 fL (ref 80.0–100.0)
Platelets: 311 10*3/uL (ref 150–400)
RBC: 5.37 MIL/uL (ref 4.22–5.81)
RDW: 12.3 % (ref 11.5–15.5)
WBC: 5.7 10*3/uL (ref 4.0–10.5)
nRBC: 0 % (ref 0.0–0.2)

## 2019-05-13 LAB — TROPONIN I (HIGH SENSITIVITY)
Troponin I (High Sensitivity): 5 ng/L (ref <18)
Troponin I (High Sensitivity): 4 ng/L (ref <18)

## 2019-05-13 MED ORDER — ASPIRIN 81 MG PO CHEW
324.0000 mg | CHEWABLE_TABLET | Freq: Once | ORAL | Status: AC
  Administered 2019-05-13: 324 mg via ORAL
  Filled 2019-05-13: qty 4

## 2019-05-13 MED ORDER — LORAZEPAM 2 MG/ML IJ SOLN
0.5000 mg | Freq: Once | INTRAMUSCULAR | Status: AC
  Administered 2019-05-13: 0.5 mg via INTRAVENOUS
  Filled 2019-05-13: qty 1

## 2019-05-13 MED ORDER — LORAZEPAM 1 MG PO TABS
1.0000 mg | ORAL_TABLET | Freq: Once | ORAL | Status: AC
  Administered 2019-05-13: 05:00:00 1 mg via ORAL
  Filled 2019-05-13: qty 1

## 2019-05-13 NOTE — Discharge Instructions (Addendum)
Your work-up was reassuring without evidence of heart attack.  You should follow-up with your primary care doctor in 2 days.  Return to the ER for any other concerns

## 2019-05-13 NOTE — ED Triage Notes (Signed)
Pt reports grandfather died yesterday and the panic attacks started at approx 0300 this am

## 2019-05-13 NOTE — ED Notes (Signed)
Pt verbalized understanding of discharge instructions and stated that he had someone to pick him up. Pt encouraged to follow up with PCP. NAD at this time.

## 2019-05-13 NOTE — ED Notes (Signed)
Pt calling out repeatedly stating that his "anxiety" is getting worse. Pt assured that cardiac tests are reassuring. MD aware and medication administered. Pt encouraged to follow up with his PCP for long term control for which he is requesting.

## 2019-05-13 NOTE — ED Provider Notes (Signed)
8:24 AM Assumed care for off going team.   Blood pressure (!) 151/109, pulse (!) 114, temperature 99.7 F (37.6 C), temperature source Oral, resp. rate (!) 27, height 5\' 11"  (1.803 m), weight 104.3 kg, SpO2 97 %.  See their HPI for full report but in brief Recent death in family. Came in with anxiety and chest pain.  Pending Repeat trop at 740.  recommend discharge home after repeat troponin.  Patient given additional 0.5 IV Ativan for his anxiety while awaiting repeat troponin.  Repeat troponin is negative.  Per off going team lower suspicion for PE, dissection or ACS.  Reevaluated patient does not have any shortness of breath or chest pain at this time.  Patient feels comfortable with discharge home and follow-up with his primary care doctor.  I discussed the provisional nature of ED diagnosis, the treatment so far, the ongoing plan of care, follow up appointments and return precautions with the patient and any family or support people present. They expressed understanding and agreed with the plan, discharged home.           Vanessa Greasewood, MD 05/13/19 [phone]

## 2019-05-13 NOTE — ED Provider Notes (Signed)
[facility] Emergency Department Provider Note  ____________________________________________  Time seen: Approximately 5:19 AM  I have reviewed the triage vital signs and the nursing notes.   HISTORY  Chief Complaint Anxiety   HPI Zachary Mendoza. is a 28 y.o. male with a history of alcohol abuse, panic attacks, anxiety, hypertension who presents for evaluation of anxiety.  Patient reports that he lost his grandfather yesterday evening to Covid.  He reports that since then every time he thinks about his grandfather he gets a panic attack.  He reports that he hyperventilates, becomes very anxious, and develops chest pressure.  He has a history of anxiety and does not take anything for it.  He has a history of alcohol abuse with the last drink being last night.  He denies any personal or family history of heart disease.  He denies any drug use.  He denies any chest pain at this time.   Past Medical History:  Diagnosis Date  . Hypertension     Patient Active Problem List   Diagnosis Date Noted  . Alcohol abuse 03/29/2019  . Severe anxiety with panic 03/28/2019  . Alcoholic intoxication with complication ([facility])     History reviewed. No pertinent surgical history.  Prior to Admission medications   Medication Sig Start Date End Date Taking? Authorizing Provider  carvedilol (COREG) 6.25 MG tablet Take 1 tablet (6.25 mg total) by mouth 2 (two) times daily with a meal. 03/30/19   Clapacs, Madie Reno, MD  famotidine (PEPCID) 20 MG tablet Take 1 tablet (20 mg total) by mouth 2 (two) times daily. 03/30/19   Clapacs, Madie Reno, MD  lisinopril (ZESTRIL) 10 MG tablet Take 1 tablet (10 mg total) by mouth daily. 03/31/19   Clapacs, Madie Reno, MD    Allergies Penicillins  History reviewed. No pertinent family history.  Social History Social History   Tobacco Use  . Smoking status: Current Every Day Smoker    Packs/day: 1.00    Types: Cigarettes  . Smokeless tobacco:  Never Used  Substance Use Topics  . Alcohol use: Yes    Comment: daily  . Drug use: Not Currently    Comment: heroin multiple times a week    Review of Systems  Constitutional: Negative for fever. Eyes: Negative for visual changes. ENT: Negative for sore throat. Neck: No neck pain  Cardiovascular: + chest pain. Respiratory: Negative for shortness of breath. Gastrointestinal: Negative for abdominal pain, vomiting or diarrhea. Genitourinary: Negative for dysuria. Musculoskeletal: Negative for back pain. Skin: Negative for rash. Neurological: Negative for headaches, weakness or numbness. Psych: No SI or HI. + anxiety  ____________________________________________   PHYSICAL EXAM:  VITAL SIGNS: ED Triage Vitals  Enc Vitals Group     BP 05/13/19 0459 (!) 154/109     Pulse Rate 05/13/19 0451 (!) 121     Resp 05/13/19 0451 15     Temp 05/13/19 0459 99.7 F (37.6 C)     Temp Source 05/13/19 0459 Oral     SpO2 05/13/19 0459 98 %     Weight 05/13/19 0454 230 lb (104.3 kg)     Height 05/13/19 0454 5\' 11"  (1.803 m)     Head Circumference --      Peak Flow --      Pain Score 05/13/19 0452 3     Pain Loc --      Pain Edu? --      Excl. in [place]? --     Constitutional:  Alert and oriented. Patient is anxious HEENT:      Head: Normocephalic and atraumatic.         Eyes: Conjunctivae are normal. Sclera is non-icteric.       Mouth/Throat: Mucous membranes are moist.       Neck: Supple with no signs of meningismus. Cardiovascular: Tachycardic with regular. No murmurs, gallops, or rubs. 2+ symmetrical distal pulses are present in all extremities. No JVD. Respiratory: Normal respiratory effort. Lungs are clear to auscultation bilaterally. No wheezes, crackles, or rhonchi.  Gastrointestinal: Soft, non tender, and non distended with positive bowel sounds. No rebound or guarding. Musculoskeletal: Nontender with normal range of motion in all extremities. No edema, cyanosis, or erythema of  extremities. Neurologic: Normal speech and language. Face is symmetric. Moving all extremities. No gross focal neurologic deficits are appreciated. Skin: Skin is warm, dry and intact. No rash noted. Psychiatric: Mood and affect are normal. Speech and behavior are normal.  ____________________________________________   LABS (all labs ordered are listed, but only abnormal results are displayed)  Labs Reviewed  BASIC METABOLIC PANEL - Abnormal; Notable for the following components:      Result Value   CO2 21 (*)    Glucose, Bld 118 (*)    All other components within normal limits  CBC  TROPONIN I (HIGH SENSITIVITY)  TROPONIN I (HIGH SENSITIVITY)   ____________________________________________  EKG  ED ECG REPORT I, Nita Sicklearolina Judyann Casasola, the attending physician, personally viewed and interpreted this ECG.  Sinus tachycardia, rate of 123, normal intervals, normal axis, no ST elevations or depressions. ____________________________________________  RADIOLOGY  I have personally reviewed the images performed during this visit and I agree with the Radiologist's read.   Interpretation by Radiologist:  Dg Chest 2 View  Result Date: 05/13/2019 CLINICAL DATA:  Chest pain EXAM: CHEST - 2 VIEW COMPARISON:  Chest x-ray dated 02/27/2019. FINDINGS: Cardiomediastinal silhouette is within normal limits in size and configuration. Lungs are clear. Lung volumes are normal. No evidence of pneumonia. No pleural effusion. No pneumothorax seen. Osseous structures about the chest are unremarkable. IMPRESSION: Normal chest x-ray. No evidence of pneumonia or pulmonary edema. Electronically Signed   By: Bary RichardStan  Maynard M.D.   On: 05/13/2019 05:50     ____________________________________________   PROCEDURES  Procedure(s) performed: None Procedures Critical Care performed:  None ____________________________________________   INITIAL IMPRESSION / ASSESSMENT AND PLAN / ED COURSE   28 y.o. male with a  history of alcohol abuse, panic attacks, anxiety, hypertension who presents for evaluation of anxiety, panic attacks, chest pain after the death of his grandfather last night from COVID-19.  Patient looks anxious, he is tachycardic.  His EKG shows no acute ischemic changes and is consistent with sinus tachycardia.  He does have a S1 Q3 T3 which is seen on prior EKGs.  He has no current chest pain.  He has no shortness of breath.  Clinically low suspicion for PE or ACS or dissection.  Chest x-ray no evidence of edema, pneumonia, or pneumothorax.  First troponin is negative.  Patient is low risk however will get a repeat troponin for stratification.  In the meantime will give p.o. Ativan for anxiety.  Patient has no SI or HI.  Does not meet criteria for IVC.    _________________________ 7:00 AM on 05/13/2019 ----------------------------------------- 2nd troponin pending. Patient remains with no further episodes of CP. Care transferred to Dr. Fuller PlanFunke     As part of my medical decision making, I reviewed the following data within the  electronic MEDICAL RECORD NUMBER Nursing notes reviewed and incorporated, Labs reviewed , EKG interpreted , Old EKG reviewed, Old chart reviewed, Radiograph reviewed , Notes from prior ED visits and [place] Controlled Substance Database   Please note:  Patient was evaluated in Emergency Department today for the symptoms described in the history of present illness. Patient was evaluated in the context of the global COVID-19 pandemic, which necessitated consideration that the patient might be at risk for infection with the SARS-CoV-2 virus that causes COVID-19. Institutional protocols and algorithms that pertain to the evaluation of patients at risk for COVID-19 are in a state of rapid change based on information released by regulatory bodies including the CDC and federal and state organizations. These policies and algorithms were followed during the patient's care in the ED.  Some ED  evaluations and interventions may be delayed as a result of limited staffing during the pandemic.   ____________________________________________   FINAL CLINICAL IMPRESSION(S) / ED DIAGNOSES   Final diagnoses:  Panic attack      NEW MEDICATIONS STARTED DURING THIS VISIT:  ED Discharge Orders    None       Note:  This document was prepared using Dragon voice recognition software and may include unintentional dictation errors.    Don Perking, [place], MD 05/13/19 [phone]

## 2019-05-13 NOTE — ED Triage Notes (Signed)
Pt arrives from home via POV with complaint of anxiety and panic attacks all night, resulting in central chest pain without n/v or radiating pain. Pt reports history of panic attacks.

## 2019-05-28 ENCOUNTER — Emergency Department
Admission: EM | Admit: 2019-05-28 | Discharge: 2019-05-28 | Disposition: A | Discharge status: Home / Self Care | Payer: Self-pay | Attending: Emergency Medicine | Admitting: Emergency Medicine

## 2019-05-28 ENCOUNTER — Other Ambulatory Visit: Payer: Self-pay

## 2019-05-28 ENCOUNTER — Encounter: Payer: Self-pay | Admitting: Emergency Medicine

## 2019-05-28 ENCOUNTER — Emergency Department: Payer: Self-pay

## 2019-05-28 DIAGNOSIS — Z79899 Other long term (current) drug therapy: Secondary | ICD-10-CM | POA: Insufficient documentation

## 2019-05-28 DIAGNOSIS — F419 Anxiety disorder, unspecified: Secondary | ICD-10-CM

## 2019-05-28 DIAGNOSIS — F410 Panic disorder [episodic paroxysmal anxiety] without agoraphobia: Secondary | ICD-10-CM | POA: Insufficient documentation

## 2019-05-28 DIAGNOSIS — F17210 Nicotine dependence, cigarettes, uncomplicated: Secondary | ICD-10-CM | POA: Insufficient documentation

## 2019-05-28 DIAGNOSIS — I10 Essential (primary) hypertension: Secondary | ICD-10-CM | POA: Insufficient documentation

## 2019-05-28 HISTORY — DX: Panic disorder (episodic paroxysmal anxiety): F41.0

## 2019-05-28 LAB — BASIC METABOLIC PANEL
Anion gap: 10 (ref 5–15)
BUN: 13 mg/dL (ref 6–20)
CO2: 28 mmol/L (ref 22–32)
Calcium: 8.5 mg/dL — ABNORMAL LOW (ref 8.9–10.3)
Chloride: 102 mmol/L (ref 98–111)
Creatinine, Ser: 0.67 mg/dL (ref 0.61–1.24)
GFR calc Af Amer: >60 mL/min (ref >60)
GFR calc non Af Amer: >60 mL/min (ref >60)
Glucose, Bld: 113 mg/dL — ABNORMAL HIGH (ref 70–99)
Potassium: 3.7 mmol/L (ref 3.5–5.1)
Sodium: 140 mmol/L (ref 135–145)

## 2019-05-28 LAB — TROPONIN I (HIGH SENSITIVITY): Troponin I (High Sensitivity): 6 ng/L (ref <18)

## 2019-05-28 LAB — CBC
HCT: 41.7 % (ref 39.0–52.0)
Hemoglobin: 14.3 g/dL (ref 13.0–17.0)
MCH: 30.6 pg (ref 26.0–34.0)
MCHC: 34.3 g/dL (ref 30.0–36.0)
MCV: 89.3 fL (ref 80.0–100.0)
Platelets: 287 10*3/uL (ref 150–400)
RBC: 4.67 MIL/uL (ref 4.22–5.81)
RDW: 13.1 % (ref 11.5–15.5)
WBC: 5.5 10*3/uL (ref 4.0–10.5)
nRBC: 0 % (ref 0.0–0.2)

## 2019-05-28 MED ORDER — LORAZEPAM 1 MG PO TABS: 1.0000 mg | ORAL_TABLET | Freq: Three times a day (TID) | ORAL | 0 refills | Status: AC | PRN

## 2019-05-28 MED ORDER — SODIUM CHLORIDE 0.9% FLUSH: 3.0000 mL | Freq: Once | INTRAVENOUS | Status: DC

## 2019-05-28 MED ORDER — LORAZEPAM 1 MG PO TABS: 1.0000 mg | ORAL_TABLET | Freq: Once | ORAL | Status: DC

## 2019-05-28 MED ORDER — LORAZEPAM 1 MG PO TABS
1.0000 mg | ORAL_TABLET | Freq: Once | ORAL | Status: AC
  Administered 2019-05-28: 1 mg via ORAL
  Filled 2019-05-28: qty 1

## 2019-05-28 NOTE — ED Provider Notes (Signed)
[facility] Emergency Department Provider Note   ____________________________________________    I have reviewed the triage vital signs and the nursing notes.   HISTORY  Chief Complaint Panic Attack     HPI Zachary Mendoza. is a 28 y.o. male who presents with complaints of anxiety.  Patient reports the last several days he has been waking up thinking about Christmas and getting gifts for his kids and becomes severely anxious.  He often develops chest discomfort when he is very anxious.  He notes that he does not have any medication for anxiety at this time.  He has been thinking about discussing with his PCP at [facility] primary but has not yet done so.  No SI or HI.  No nausea vomiting diaphoresis.  No radiation of pain.  Currently pain-free.  No shortness of breath.  No pleurisy  Past Medical History:  Diagnosis Date  . Hypertension   . Panic attacks     Patient Active Problem List   Diagnosis Date Noted  . Alcohol abuse 03/29/2019  . Severe anxiety with panic 03/28/2019  . Alcoholic intoxication with complication ([place])     History reviewed. No pertinent surgical history.  Prior to Admission medications   Medication Sig Start Date End Date Taking? Authorizing Provider  carvedilol (COREG) 6.25 MG tablet Take 1 tablet (6.25 mg total) by mouth 2 (two) times daily with a meal. 03/30/19   Clapacs, Madie Reno, MD  famotidine (PEPCID) 20 MG tablet Take 1 tablet (20 mg total) by mouth 2 (two) times daily. 03/30/19   Clapacs, Madie Reno, MD  lisinopril (ZESTRIL) 10 MG tablet Take 1 tablet (10 mg total) by mouth daily. 03/31/19   Clapacs, Madie Reno, MD  LORazepam (ATIVAN) 1 MG tablet Take 1 tablet (1 mg total) by mouth every 8 (eight) hours as needed for up to 4 days for anxiety. 05/28/19 06/01/19  Lavonia Drafts, MD     Allergies Penicillins  No family history on file.  Social History Social History   Tobacco Use  . Smoking status: Current Every Day Smoker      Packs/day: 1.00    Types: Cigarettes  . Smokeless tobacco: Never Used  Substance Use Topics  . Alcohol use: Yes  . Drug use: Not Currently    Comment: heroin multiple times a week    Review of Systems  Constitutional: No fever/chills Eyes: No visual changes.  ENT: No sore throat. Cardiovascular: When severely anxious has tightness Respiratory: No shortness of breath no cough Gastrointestinal: No abdominal pain.   Genitourinary: Negative for dysuria. Musculoskeletal: Negative for back pain. Skin: Negative for rash. Neurological: Negative for headaches    ____________________________________________   PHYSICAL EXAM:  VITAL SIGNS: ED Triage Vitals [05/28/19 0642]  Enc Vitals Group     BP (!) 131/95     Pulse Rate (!) 101     Resp 18     Temp 98 F (36.7 C)     Temp src      SpO2 100 %     Weight 102.1 kg (225 lb)     Height 1.803 m (5\' 11" )     Head Circumference      Peak Flow      Pain Score 4     Pain Loc      Pain Edu?      Excl. in [place]?     Constitutional: Alert and oriented.   Nose: No congestion/rhinnorhea. Mouth/Throat: Mucous membranes are moist.  Cardiovascular: Normal rate, regular rhythm. Grossly normal heart sounds.  Good peripheral circulation. Respiratory: Normal respiratory effort.  No retractions.  Gastrointestinal: Soft and nontender. No distention.    Musculoskeletal:  Warm and well perfused Neurologic:  Normal speech and language. No gross focal neurologic deficits are appreciated.  Skin:  Skin is warm, dry and intact. No rash noted. Psychiatric: Mood and affect are normal. Speech and behavior are normal.  ____________________________________________   LABS (all labs ordered are listed, but only abnormal results are displayed)  Labs Reviewed  BASIC METABOLIC PANEL - Abnormal; Notable for the following components:      Result Value   Glucose, Bld 113 (*)    Calcium 8.5 (*)    All other components within normal limits  CBC   TROPONIN I (HIGH SENSITIVITY)  TROPONIN I (HIGH SENSITIVITY)   ____________________________________________  EKG  ED ECG REPORT I, Jene Every, the attending physician, personally viewed and interpreted this ECG.  Date: 05/28/2019  Rhythm: normal sinus rhythm QRS Axis: normal Intervals: normal ST/T Wave abnormalities: normal Narrative Interpretation: no evidence of acute ischemia  ____________________________________________  RADIOLOGY  Chest x-ray unremarkable ____________________________________________   PROCEDURES  Procedure(s) performed: No  Procedures   Critical Care performed: No ____________________________________________   INITIAL IMPRESSION / ASSESSMENT AND PLAN / ED COURSE  Pertinent labs & imaging results that were available during my care of the patient were reviewed by me and considered in my medical decision making (see chart for details).  Patient presents with symptoms as described above consistent with significant anxiety.  Chest discomfort not consistent with ACS, lab work, EKG reassuring.  Counseled patient on smoking cessation.  Will treat with short course of benzos, recommend outpatient follow-up for consideration for SSRI    ____________________________________________   FINAL CLINICAL IMPRESSION(S) / ED DIAGNOSES  Final diagnoses:  Anxiety        Note:  This document was prepared using Dragon voice recognition software and may include unintentional dictation errors.   Jene Every, MD 05/28/19 [phone]

## 2019-05-28 NOTE — ED Triage Notes (Signed)
Patient states that he started having a panic attack last night and that he has not been able to calm down. Patient states that he started having chest pain this morning from his panic attack.

## 2019-06-06 ENCOUNTER — Encounter: Payer: Self-pay | Admitting: Emergency Medicine

## 2019-06-06 ENCOUNTER — Other Ambulatory Visit: Payer: Self-pay

## 2019-06-06 ENCOUNTER — Emergency Department
Admission: EM | Admit: 2019-06-06 | Discharge: 2019-06-06 | Disposition: A | Discharge status: Home / Self Care | Payer: Self-pay | Attending: Emergency Medicine | Admitting: Emergency Medicine

## 2019-06-06 ENCOUNTER — Emergency Department: Payer: Self-pay

## 2019-06-06 DIAGNOSIS — R0789 Other chest pain: Secondary | ICD-10-CM

## 2019-06-06 DIAGNOSIS — F419 Anxiety disorder, unspecified: Secondary | ICD-10-CM

## 2019-06-06 DIAGNOSIS — I10 Essential (primary) hypertension: Secondary | ICD-10-CM | POA: Insufficient documentation

## 2019-06-06 DIAGNOSIS — F17210 Nicotine dependence, cigarettes, uncomplicated: Secondary | ICD-10-CM | POA: Insufficient documentation

## 2019-06-06 DIAGNOSIS — Z79899 Other long term (current) drug therapy: Secondary | ICD-10-CM | POA: Insufficient documentation

## 2019-06-06 LAB — COMPREHENSIVE METABOLIC PANEL
ALT: 30 U/L (ref 0–44)
AST: 38 U/L (ref 15–41)
Albumin: 4.4 g/dL (ref 3.5–5.0)
Alkaline Phosphatase: 65 U/L (ref 38–126)
Anion gap: 12 (ref 5–15)
BUN: 15 mg/dL (ref 6–20)
CO2: 24 mmol/L (ref 22–32)
Calcium: 9.3 mg/dL (ref 8.9–10.3)
Chloride: 101 mmol/L (ref 98–111)
Creatinine, Ser: 0.8 mg/dL (ref 0.61–1.24)
GFR calc Af Amer: >60 mL/min (ref >60)
GFR calc non Af Amer: >60 mL/min (ref >60)
Glucose, Bld: 121 mg/dL — ABNORMAL HIGH (ref 70–99)
Potassium: 4.1 mmol/L (ref 3.5–5.1)
Sodium: 137 mmol/L (ref 135–145)
Total Bilirubin: 0.5 mg/dL (ref 0.3–1.2)
Total Protein: 7.8 g/dL (ref 6.5–8.1)

## 2019-06-06 LAB — CBC WITH DIFFERENTIAL/PLATELET
Abs Immature Granulocytes: 0.03 10*3/uL (ref 0.00–0.07)
Basophils Absolute: 0 10*3/uL (ref 0.0–0.1)
Basophils Relative: 1 %
Eosinophils Absolute: 0.1 10*3/uL (ref 0.0–0.5)
Eosinophils Relative: 2 %
HCT: 43.2 % (ref 39.0–52.0)
Hemoglobin: 14.8 g/dL (ref 13.0–17.0)
Immature Granulocytes: 1 %
Lymphocytes Relative: 31 %
Lymphs Abs: 1.6 10*3/uL (ref 0.7–4.0)
MCH: 30.5 pg (ref 26.0–34.0)
MCHC: 34.3 g/dL (ref 30.0–36.0)
MCV: 89.1 fL (ref 80.0–100.0)
Monocytes Absolute: 0.7 10*3/uL (ref 0.1–1.0)
Monocytes Relative: 14 %
Neutro Abs: 2.7 10*3/uL (ref 1.7–7.7)
Neutrophils Relative %: 51 %
Platelets: 264 10*3/uL (ref 150–400)
RBC: 4.85 MIL/uL (ref 4.22–5.81)
RDW: 13.5 % (ref 11.5–15.5)
WBC: 5.3 10*3/uL (ref 4.0–10.5)
nRBC: 0 % (ref 0.0–0.2)

## 2019-06-06 LAB — TROPONIN I (HIGH SENSITIVITY)
Troponin I (High Sensitivity): 6 ng/L (ref <18)
Troponin I (High Sensitivity): 5 ng/L (ref <18)

## 2019-06-06 MED ORDER — LORAZEPAM 1 MG PO TABS
1.0000 mg | ORAL_TABLET | Freq: Once | ORAL | Status: AC
  Administered 2019-06-06: 1 mg via ORAL
  Filled 2019-06-06: qty 1

## 2019-06-06 MED ORDER — LORAZEPAM 1 MG PO TABS: 1.0000 mg | ORAL_TABLET | Freq: Two times a day (BID) | ORAL | 0 refills | Status: DC | PRN

## 2019-06-06 NOTE — ED Provider Notes (Signed)
[facility] Emergency Department Provider Note ____________________________________________   First MD Initiated Contact with Patient 06/06/19 0601     (approximate)  I have reviewed the triage vital signs and the nursing notes.   HISTORY  Chief Complaint Chest Pain    HPI Zachary L Daniele Montez HagemanJr. is a 28 y.o. male with PMH as noted below who presents with chest pain, acute onset this morning around 3 AM, substernal, and nonradiating.  He states it is associated with anxiety.  The patient states that he has had significant anxiety especially early in the morning for some time.  He was previously given medication after his last ED visit and states it helped but since he ran out he has had the same thing happen.  He reports that the pain today is similar to what he has had on prior ED visits.  He denies shortness of breath, nausea, or lightheadedness.  The patient states he is planning to follow-up with his PMD but was not able to before he ran out of the medication he got from the ER.  He states that he drinks occasionally but has not been drinking tonight.   Past Medical History:  Diagnosis Date  . Hypertension   . Panic attacks     Patient Active Problem List   Diagnosis Date Noted  . Alcohol abuse 03/29/2019  . Severe anxiety with panic 03/28/2019  . Alcoholic intoxication with complication (HCC)     History reviewed. No pertinent surgical history.  Prior to Admission medications   Medication Sig Start Date End Date Taking? Authorizing Provider  carvedilol (COREG) 6.25 MG tablet Take 1 tablet (6.25 mg total) by mouth 2 (two) times daily with a meal. 03/30/19   Clapacs, Jackquline DenmarkJohn T, MD  famotidine (PEPCID) 20 MG tablet Take 1 tablet (20 mg total) by mouth 2 (two) times daily. 03/30/19   Clapacs, Jackquline DenmarkJohn T, MD  lisinopril (ZESTRIL) 10 MG tablet Take 1 tablet (10 mg total) by mouth daily. 03/31/19   Clapacs, Jackquline DenmarkJohn T, MD  LORazepam (ATIVAN) 1 MG tablet Take 1 tablet  (1 mg total) by mouth 2 (two) times daily as needed for anxiety. 06/06/19 06/05/20  Dionne BucySiadecki, Mike Hamre, MD    Allergies Penicillins  No family history on file.  Social History Social History   Tobacco Use  . Smoking status: Current Every Day Smoker    Packs/day: 1.00    Types: Cigarettes  . Smokeless tobacco: Never Used  Substance Use Topics  . Alcohol use: Yes  . Drug use: Not Currently    Comment: heroin multiple times a week    Review of Systems  Constitutional: No fever. Eyes: No redness. ENT: No sore throat. Cardiovascular: Positive for chest pain. Respiratory: Denies shortness of breath. Gastrointestinal: No vomiting or diarrhea.  Genitourinary: Negative for flank pain Musculoskeletal: Negative for back pain. Skin: Negative for rash. Neurological: Negative for headache.   ____________________________________________   PHYSICAL EXAM:  VITAL SIGNS: ED Triage Vitals  Enc Vitals Group     BP 06/06/19 0515 (!) 149/108     Pulse Rate 06/06/19 0515 94     Resp 06/06/19 0515 18     Temp 06/06/19 0515 98.7 F (37.1 C)     Temp Source 06/06/19 0515 Oral     SpO2 06/06/19 0515 97 %     Weight 06/06/19 0516 230 lb (104.3 kg)     Height 06/06/19 0516 5\' 11"  (1.803 m)     Head Circumference --  Peak Flow --      Pain Score 06/06/19 0516 3     Pain Loc --      Pain Edu? --      Excl. in [place]? --     Constitutional: Alert and oriented.  Anxious appearing but in no acute distress. Eyes: Conjunctivae are normal.  Head: Atraumatic. Nose: No congestion/rhinnorhea. Mouth/Throat: Mucous membranes are moist.   Neck: Normal range of motion.  Cardiovascular: Normal rate, regular rhythm.  Good peripheral circulation. Respiratory: Normal respiratory effort.  No retractions.  Gastrointestinal: No distention.  Musculoskeletal: No lower extremity edema.  Extremities warm and well perfused.  Neurologic:  Normal speech and language. No gross focal neurologic deficits  are appreciated.  Skin:  Skin is warm and dry. No rash noted. Psychiatric: Very anxious appearing.  ____________________________________________   LABS (all labs ordered are listed, but only abnormal results are displayed)  Labs Reviewed  COMPREHENSIVE METABOLIC PANEL - Abnormal; Notable for the following components:      Result Value   Glucose, Bld 121 (*)    All other components within normal limits  CBC WITH DIFFERENTIAL/PLATELET  TROPONIN I (HIGH SENSITIVITY)  TROPONIN I (HIGH SENSITIVITY)   ____________________________________________  EKG  ED ECG REPORT I, Arta Silence, the attending physician, personally viewed and interpreted this ECG.  Date: 06/06/2019 EKG Time: 0515 Rate: 89 Rhythm: normal sinus rhythm QRS Axis: normal Intervals: normal ST/T Wave abnormalities: normal Narrative Interpretation: no evidence of acute ischemia  ____________________________________________  RADIOLOGY  CXR: No focal infiltrate or other acute abnormality  ____________________________________________   PROCEDURES  Procedure(s) performed: No  Procedures  Critical Care performed: No ____________________________________________   INITIAL IMPRESSION / ASSESSMENT AND PLAN / ED COURSE  Pertinent labs & imaging results that were available during my care of the patient were reviewed by me and considered in my medical decision making (see chart for details).  28 year old male with PMH as noted above presents with acute onset of chest pain in the context of anxiety early this morning.  He reports that this is identical to the symptoms he has presented with to the ED previously.  I reviewed the past medical records in [place].  The patient has had multiple prior visits to the ED with a similar presentation including on 12/14, 11/29, and was seen on 10/14 with alcohol intoxication and worsening depression.  However, he states he has only been drinking occasionally since  then.  On exam today the patient is very anxious appearing.  His vital signs are normal except for mild hypertension.  The remainder of the physical exam is unremarkable.  EKG is nonischemic.  Overall presentation is consistent with anxiety.  There is no evidence of ACS, given the multiple similar prior presentations and negative work-ups in the past as well as the atypical nature of the symptoms.  The patient has no signs or symptoms of DVT or PE and is PERC negative.  There is no evidence of aortic dissection or other vascular cause.  Chest x-ray, basic labs, and initial troponin are all negative.  I will give a dose of p.o. Ativan and repeat the troponin after 2 hours.  I anticipate discharge home.  The patient was given a small quantity of Ativan on his last ED visit which seem to help temporarily.  He understands he needs to follow-up with his PMD for further evaluation and to possibly be started on an SSRI.  ----------------------------------------- 7:05 AM on 06/06/2019 -----------------------------------------  Symptoms improving with Ativan.  Plan  for discharge home if the repeat troponin is negative.  I signed the patient out to Dr. Cyril Loosen.  ____________________________________________   FINAL CLINICAL IMPRESSION(S) / ED DIAGNOSES  Final diagnoses:  Atypical chest pain  Anxiety      NEW MEDICATIONS STARTED DURING THIS VISIT:  New Prescriptions   LORAZEPAM (ATIVAN) 1 MG TABLET    Take 1 tablet (1 mg total) by mouth 2 (two) times daily as needed for anxiety.     Note:  This document was prepared using Dragon voice recognition software and may include unintentional dictation errors.    Dionne Bucy, MD 06/06/19 [phone]

## 2019-06-06 NOTE — ED Triage Notes (Addendum)
Patient ambulatory to triage with steady gait, without difficulty, pt is very anxious, mask in place; pt reports left sided CP,nonradiating since last night accomp by "panic attacks"

## 2019-06-06 NOTE — Discharge Instructions (Signed)
You may take the lorazepam as needed for severe anxiety.  Follow-up with your primary care doctor as soon as possible.  Return to the ER for new, worsening, or persistent severe chest pain, difficulty breathing, weakness or lightheadedness, worsening anxiety or invasive thoughts, any thoughts of wanting to hurt yourself or others, or any other new or worsening symptoms that concern you.

## 2019-06-06 NOTE — ED Notes (Signed)
Pt states "i'm feeling a little better." States he has HTN and will take his medicine at home. NAD, call light in reach.

## 2019-06-06 NOTE — ED Notes (Signed)
Pt esigned, however it didn't show up on pad.

## 2019-06-10 ENCOUNTER — Other Ambulatory Visit: Payer: Self-pay

## 2019-06-10 ENCOUNTER — Encounter: Payer: Self-pay | Admitting: Emergency Medicine

## 2019-06-10 ENCOUNTER — Emergency Department
Admission: EM | Admit: 2019-06-10 | Discharge: 2019-06-10 | Disposition: A | Discharge status: Home / Self Care | Payer: Self-pay | Attending: Student | Admitting: Student

## 2019-06-10 DIAGNOSIS — Z79899 Other long term (current) drug therapy: Secondary | ICD-10-CM | POA: Insufficient documentation

## 2019-06-10 DIAGNOSIS — F410 Panic disorder [episodic paroxysmal anxiety] without agoraphobia: Secondary | ICD-10-CM | POA: Diagnosis present

## 2019-06-10 DIAGNOSIS — F419 Anxiety disorder, unspecified: Secondary | ICD-10-CM | POA: Insufficient documentation

## 2019-06-10 DIAGNOSIS — Z008 Encounter for other general examination: Secondary | ICD-10-CM | POA: Insufficient documentation

## 2019-06-10 DIAGNOSIS — F10229 Alcohol dependence with intoxication, unspecified: Secondary | ICD-10-CM | POA: Insufficient documentation

## 2019-06-10 DIAGNOSIS — Y904 Blood alcohol level of 80-99 mg/100 ml: Secondary | ICD-10-CM | POA: Insufficient documentation

## 2019-06-10 DIAGNOSIS — F17210 Nicotine dependence, cigarettes, uncomplicated: Secondary | ICD-10-CM | POA: Insufficient documentation

## 2019-06-10 DIAGNOSIS — I10 Essential (primary) hypertension: Secondary | ICD-10-CM | POA: Insufficient documentation

## 2019-06-10 DIAGNOSIS — F1010 Alcohol abuse, uncomplicated: Secondary | ICD-10-CM | POA: Diagnosis present

## 2019-06-10 LAB — COMPREHENSIVE METABOLIC PANEL
ALT: 29 U/L (ref 0–44)
AST: 34 U/L (ref 15–41)
Albumin: 4.4 g/dL (ref 3.5–5.0)
Alkaline Phosphatase: 75 U/L (ref 38–126)
Anion gap: 17 — ABNORMAL HIGH (ref 5–15)
BUN: 13 mg/dL (ref 6–20)
CO2: 21 mmol/L — ABNORMAL LOW (ref 22–32)
Calcium: 9.3 mg/dL (ref 8.9–10.3)
Chloride: 100 mmol/L (ref 98–111)
Creatinine, Ser: 0.75 mg/dL (ref 0.61–1.24)
GFR calc Af Amer: >60 mL/min (ref >60)
GFR calc non Af Amer: >60 mL/min (ref >60)
Glucose, Bld: 114 mg/dL — ABNORMAL HIGH (ref 70–99)
Potassium: 3.6 mmol/L (ref 3.5–5.1)
Sodium: 138 mmol/L (ref 135–145)
Total Bilirubin: 0.8 mg/dL (ref 0.3–1.2)
Total Protein: 7.9 g/dL (ref 6.5–8.1)

## 2019-06-10 LAB — URINE DRUG SCREEN, QUALITATIVE (ARMC ONLY)
Amphetamines, Ur Screen: POSITIVE — AB
Barbiturates, Ur Screen: NOT DETECTED
Benzodiazepine, Ur Scrn: POSITIVE — AB
Cannabinoid 50 Ng, Ur ~~LOC~~: NOT DETECTED
Cocaine Metabolite,Ur ~~LOC~~: NOT DETECTED
MDMA (Ecstasy)Ur Screen: NOT DETECTED
Methadone Scn, Ur: NOT DETECTED
Opiate, Ur Screen: NOT DETECTED
Phencyclidine (PCP) Ur S: NOT DETECTED
Tricyclic, Ur Screen: NOT DETECTED

## 2019-06-10 LAB — CBC
HCT: 42.8 % (ref 39.0–52.0)
Hemoglobin: 15.7 g/dL (ref 13.0–17.0)
MCH: 30.8 pg (ref 26.0–34.0)
MCHC: 36.7 g/dL — ABNORMAL HIGH (ref 30.0–36.0)
MCV: 83.9 fL (ref 80.0–100.0)
Platelets: 263 10*3/uL (ref 150–400)
RBC: 5.1 MIL/uL (ref 4.22–5.81)
RDW: 13.2 % (ref 11.5–15.5)
WBC: 6.1 10*3/uL (ref 4.0–10.5)
nRBC: 0 % (ref 0.0–0.2)

## 2019-06-10 LAB — ETHANOL: Alcohol, Ethyl (B): 99 mg/dL — ABNORMAL HIGH (ref <10)

## 2019-06-10 MED ORDER — CHLORDIAZEPOXIDE HCL 25 MG PO CAPS: ORAL_CAPSULE | ORAL | 0 refills | Status: DC

## 2019-06-10 MED ORDER — THIAMINE HCL 100 MG PO TABS
100.0000 mg | ORAL_TABLET | Freq: Once | ORAL | Status: AC
  Administered 2019-06-10: 100 mg via ORAL
  Filled 2019-06-10: qty 1

## 2019-06-10 MED ORDER — LORAZEPAM 1 MG PO TABS
1.0000 mg | ORAL_TABLET | Freq: Once | ORAL | Status: AC
  Administered 2019-06-10: 1 mg via ORAL
  Filled 2019-06-10: qty 1

## 2019-06-10 MED ORDER — FOLIC ACID 1 MG PO TABS
1.0000 mg | ORAL_TABLET | Freq: Once | ORAL | Status: AC
  Administered 2019-06-10: 1 mg via ORAL
  Filled 2019-06-10: qty 1

## 2019-06-10 NOTE — ED Provider Notes (Addendum)
[facility] Emergency Department Provider Note  ____________________________________________   First MD Initiated Contact with Patient 06/10/19 1655     (approximate)  I have reviewed the triage vital signs and the nursing notes.  History  Chief Complaint Medical Clearance    HPI Zachary Mendoza. is a 28 y.o. male who presents to the ED for anxiety/panic attacks, and assistance with alcohol cessation.   Patient states he was previously prescribed a short course of Ativan for panic attacks, which has helped in the past. His wife apparently threw his Rx away. He began experiencing more anxiety/panic attacks and therefore starting using alcohol to self treat. Was drinking about a pint of liquor daily. He tried to stop/slow down his drinking himself, but this only made his anxiety worse.   He would like assistance managing his symptoms. He denies any SI, HI, or hallucinations.     Past Medical Hx Past Medical History:  Diagnosis Date  . Hypertension   . Panic attacks     Problem List Patient Active Problem List   Diagnosis Date Noted  . Alcohol abuse 03/29/2019  . Severe anxiety with panic 03/28/2019  . Alcoholic intoxication with complication [facility])     Past Surgical Hx History reviewed. No pertinent surgical history.  Medications Prior to Admission medications   Medication Sig Start Date End Date Taking? Authorizing Provider  carvedilol (COREG) 6.25 MG tablet Take 1 tablet (6.25 mg total) by mouth 2 (two) times daily with a meal. 03/30/19   Clapacs, Jackquline Denmark, MD  famotidine (PEPCID) 20 MG tablet Take 1 tablet (20 mg total) by mouth 2 (two) times daily. 03/30/19   Clapacs, Jackquline Denmark, MD  lisinopril (ZESTRIL) 10 MG tablet Take 1 tablet (10 mg total) by mouth daily. 03/31/19   Clapacs, Jackquline Denmark, MD  LORazepam (ATIVAN) 1 MG tablet Take 1 tablet (1 mg total) by mouth 2 (two) times daily as needed for anxiety. 06/06/19 06/05/20  Dionne Bucy, MD     Allergies Penicillins  Family Hx History reviewed. No pertinent family history.  Social Hx Social History   Tobacco Use  . Smoking status: Current Every Day Smoker    Packs/day: 1.00    Types: Cigarettes  . Smokeless tobacco: Never Used  Substance Use Topics  . Alcohol use: Yes  . Drug use: Not Currently    Comment: heroin multiple times a week     Review of Systems  Constitutional: Negative for fever, chills. Eyes: Negative for visual changes. ENT: Negative for sore throat. Cardiovascular: Negative for chest pain. Respiratory: Negative for shortness of breath. Gastrointestinal: Negative for nausea, vomiting.  Genitourinary: Negative for dysuria. Musculoskeletal: Negative for leg swelling. Skin: Negative for rash. Neurological: Negative for for headaches. + anxiety   Physical Exam  Vital Signs: ED Triage Vitals  Enc Vitals Group     BP 06/10/19 1602 (!) 150/101     Pulse Rate 06/10/19 1602 (!) 115     Resp 06/10/19 1602 20     Temp 06/10/19 1602 98.5 F (36.9 C)     Temp Source 06/10/19 1602 Oral     SpO2 06/10/19 1602 96 %     Weight 06/10/19 1556 230 lb (104.3 kg)     Height 06/10/19 1556 5\' 11"  (1.803 m)     Head Circumference --      Peak Flow --      Pain Score 06/10/19 1556 0     Pain Loc --  Pain Edu? --      Excl. in [place]? --     Constitutional: Alert and oriented.  Head: Normocephalic. Atraumatic. Eyes: Conjunctivae clear. Sclera anicteric. Nose: No congestion. No rhinorrhea. Mouth/Throat: Wearing mask.  Neck: No stridor.   Cardiovascular: Normal rate, regular rhythm. Extremities well perfused. Respiratory: Normal respiratory effort.  Lungs CTAB. Gastrointestinal: Soft. Non-tender. Non-distended.  Musculoskeletal: No lower extremity edema. No deformities. Neurologic:  Normal speech and language. No gross focal neurologic deficits are appreciated. No tremors.  Skin: Skin is warm, dry and intact. No rash noted. No diaphoresis.   Psychiatric: Mildly anxious, but calm and cooperative.   Procedures  Procedure(s) performed (including critical care):  Procedures   Initial Impression / Assessment and Plan / ED Course  28 y.o. male who presents to the ED for anxiety/panic attacks and assistance with alcohol detox.   Basic labs w/o actionable derangements. UDS + benzos and amphetamines. Alcohol 99. Perhaps substance use worsening his anxiety +/- benzo withdrawal component as well.   Will consult psychiatry + TTS for assistance.   Patient has been seen and evaluated by psychiatry team and deemed appropriate for discharge. Patient determined not to be a threat to themselves or others. TTS has seen and provided resources.   Patient is clinically sober - ambulatory with steady gait, clear speech, linear thought process, and has tolerated PO in the ED. CIWA < 8. Initially tachycardia improved on recheck.  Will plan for discharge with outpatient follow up. Prescribed Librium taper for assistance w/ symptoms. Given return precautions.    Final Clinical Impression(s) / ED Diagnosis  Final diagnoses:  Alcohol abuse  Anxiety       Note:  This document was prepared using Dragon voice recognition software and may include unintentional dictation errors.     Lilia Pro., MD 06/11/19 [phone]

## 2019-06-10 NOTE — ED Notes (Signed)
Pt given belonging bags 3/3 and is changing back into clothes. Pt has ride coming to pick him up. Pt going to lobby. Understands d/c instructions and has multiple resources from TTS.

## 2019-06-10 NOTE — ED Triage Notes (Addendum)
Pt via POV saying he wants help to stop drinking. Pt has been admitted to the [facility] in the past and it really helped him. Pt states every time he tries his anxiety increased. Pt denies SI and HI at this time. Pt's last drink was this AM.

## 2019-06-10 NOTE — ED Notes (Signed)
Pt given meal tray.

## 2019-06-10 NOTE — BH Assessment (Addendum)
Tele Assessment Note   Patient Name: Zachary Mendoza. MRN: [id number] Referring Physician:  Location of Patient:  Location of Provider: Behavioral Health TTS Department  Ivan Anchors Kegg Montez Hageman. is an 28 y.o. male. Pt presented to the ED as he is seeking assistance with his anxiety and alcohol withdrawals; pt states he wants to quit drinking but he is having a hard time dealing with the withdrawal symptoms; pt denies SI and HI; pt denies A/V hallucinations; pt denies any past history related to mental health except the anxiety accompanied with his alcohol; pt states he has been missing work lately as he feels sick a lot from either withdrawal or drinking too much; pt denies any depression related symptoms; pt is seeking information on detox facilities; pt was cooperative during assessment   Diagnosis:  Axis I : Alcohol Use Disorder, Severe Axis II: deferred Axis III: see medical notes Axis IV: access to substance abuse treatment   Past Medical History:  Past Medical History:  Diagnosis Date  . Hypertension   . Panic attacks     History reviewed. No pertinent surgical history.  Family History: History reviewed. No pertinent family history.  Social History:  reports that he has been smoking cigarettes. He has been smoking about 1.00 pack per day. He has never used smokeless tobacco. He reports current alcohol use. He reports previous drug use.  Additional Social History:     CIWA: CIWA-Ar BP: (!) 150/101 Pulse Rate: (!) 115 Nausea and Vomiting: no nausea and no vomiting Tactile Disturbances: none Tremor: two Auditory Disturbances: not present Paroxysmal Sweats: no sweat visible Visual Disturbances: not present Anxiety: two Headache, Fullness in Head: none present Agitation: somewhat more than normal activity Orientation and Clouding of Sensorium: oriented and can do serial additions CIWA-Ar Total: 5 COWS:    Allergies:  Allergies  Allergen Reactions  . Penicillins Hives     Home Medications: (Not in a hospital admission)   OB/GYN Status:  No LMP for male patient.  General Assessment Data Location of Assessment: [facility] ED TTS Assessment: In system Is this a Tele or Face-to-Face Assessment?: Face-to-Face Is this an Initial Assessment or a Re-assessment for this encounter?: Initial Assessment Patient Accompanied by:: N/A Language Other than English: No What gender do you identify as?: Male Living Arrangements: Spouse/significant other Can pt return to current living arrangement?: Yes Admission Status: Voluntary Is patient capable of signing voluntary admission?: Yes Referral Source: Self/Family/Friend  Medical Screening Exam [facility] Walk-in ONLY) Medical Exam completed: Yes  Crisis Care Plan Living Arrangements: Spouse/significant other     Risk to self with the past 6 months Suicidal Ideation: No Has patient been a risk to self within the past 6 months prior to admission? : No Suicidal Intent: No Has patient had any suicidal intent within the past 6 months prior to admission? : No Is patient at risk for suicide?: No Suicidal Plan?: No-Not Currently/Within Last 6 Months Has patient had any suicidal plan within the past 6 months prior to admission? : No Access to Means: Yes Specify Access to Suicidal Means: household items What has been your use of drugs/alcohol within the last 12 months?: alcohol Previous Attempts/Gestures: No How many times?: 0 Other Self Harm Risks: alcohol abuse Intentional Self Injurious Behavior: None Family Suicide History: No Recent stressful life event(s): (trying to stop drinking) Persecutory voices/beliefs?: No Depression: Yes Depression Symptoms: Guilt Substance abuse history and/or treatment for substance abuse?: Yes Suicide prevention information given to non-admitted patients: Yes  Risk  to Others within the past 6 months Homicidal Ideation: No Does patient have any lifetime risk of violence toward others  beyond the six months prior to admission? : No Thoughts of Harm to Others: No Current Homicidal Intent: No Current Homicidal Plan: No Access to Homicidal Means: Yes Describe Access to Homicidal Means: household items Identified Victim: none History of harm to others?: No Assessment of Violence: None Noted Violent Behavior Description: cooperative Does patient have access to weapons?: No Criminal Charges Pending?: No Does patient have a court date: No Is patient on probation?: No  Psychosis Hallucinations: None noted Delusions: None noted  Mental Status Report Appearance/Hygiene: In scrubs Eye Contact: Fair Motor Activity: Freedom of movement Speech: Soft Level of Consciousness: Alert Mood: Anxious Affect: Anxious Anxiety Level: Minimal Thought Processes: Coherent, Relevant Judgement: Impaired Orientation: Person, Time, Place, Situation Obsessive Compulsive Thoughts/Behaviors: None  Cognitive Functioning Concentration: Normal Memory: Remote Intact, Recent Intact Is patient IDD: No Insight: Fair Impulse Control: Fair Appetite: Fair Have you had any weight changes? : No Change Sleep: No Change Total Hours of Sleep: 6 Vegetative Symptoms: None  ADLScreening [facility] Assessment Services) Patient's cognitive ability adequate to safely complete daily activities?: Yes Patient able to express need for assistance with ADLs?: Yes Independently performs ADLs?: Yes (appropriate for developmental age)     Prior Outpatient Therapy Prior Outpatient Therapy: No Does patient have an ACCT team?: No Does patient have Intensive In-House Services?  : No Does patient have [facility] services? : No Does patient have P4CC services?: No  ADL Screening (condition at time of admission) Patient's cognitive ability adequate to safely complete daily activities?: Yes Patient able to express need for assistance with ADLs?: Yes Independently performs ADLs?: Yes (appropriate for developmental  age)             Regulatory affairs officer (For Healthcare) Does Patient Have a Medical Advance Directive?: No Would patient like information on creating a medical advance directive?: No - Patient declined          Disposition:  Disposition Initial Assessment Completed for this Encounter: Yes Disposition of Patient: Discharge   Pt provided information for substance abuse detox facilities;     Gar Ponto 06/10/2019 6:09 PM

## 2019-06-10 NOTE — Discharge Instructions (Signed)
Thank you for letting us take care of you in the emergency department today.   Please continue to take any regular, prescribed medications.   New medications we have prescribed:  - Librium - this is a taper medication to help with your anxiety and to help with alcohol cessation. Take as directed. Do NOT drink alcohol when on this medication.   Please follow up with the resources provided.   Please return to the ER for any new or worsening symptoms.

## 2019-06-10 NOTE — Consult Note (Signed)
[facility] Face-to[facility]Face [facility] Consult   Reason for Consult:  Alcohol abuse Referring Physician:  EDP Patient Identification: Zachary Mendoza Jr. MRN:  [id number] Principal Diagnosis: Alcohol abuse Diagnosis:  Principal Problem:   Alcohol abuse Active Problems:   Severe anxiety with panic   Total Time spent with patient: 45 minutes  Subjective:   Zachary L Minami Montez HagemanJr. is a 28 y.o. male patient reports that he came to the emergency department because he is needing assistance with his alcohol abuse.  Patient reports that he drinks approximately a pint of liquor a day and has been trying to increase it, however when he starts decreasing the amount of alcohol he drinks he starts becoming extremely anxious and started having panic attacks.  Patient states that he has been in the hospital before and they gave him some Ativan that helped him for a few days but he was unable to stop drinking completely.  Patient continually denies having any suicidal or homicidal ideations and denies any hallucinations.  Patient reports that he is willing to go to a residential facility and is okay if he is discharged home just wanting to have some medication to assist with his withdrawal symptoms.  HPI: 28 year old male [facility] requesting assistance with alcohol abuse.  Patient is seen by this provider via face-to-face.  Patient has denied any suicidal or homicidal ideations and denies any hallucinations.  Patient is continually asking for assistance with his alcohol abuse.  After discussing with Dr. Colon BranchMonks she is agreed to provide the patient with a tapering dose of Librium.  I have contacted TTS staff and they are going to provide the patient with a list of facilities that he can go to to get assistance for his alcohol abuse.  At this time the patient does not meet inpatient criteria and is psychiatric cleared.  I have notified Dr. Colon BranchMonks of the recommendations and she is in agreement.  Past  Psychiatric History: alcohol abuse and anxiety  Risk to Self:   Risk to Others:   Prior Inpatient Therapy:   Prior Outpatient Therapy:    Past Medical History:  Past Medical History:  Diagnosis Date  . Hypertension   . Panic attacks    History reviewed. No pertinent surgical history. Family History: History reviewed. No pertinent family history. Family Psychiatric  History: None reported Social History:  Social History   Substance and Sexual Activity  Alcohol Use Yes     Social History   Substance and Sexual Activity  Drug Use Not Currently   Comment: heroin multiple times a week    Social History   Socioeconomic History  . Marital status: Single    Spouse name: Not on file  . Number of children: Not on file  . Years of education: Not on file  . Highest education level: Not on file  Occupational History  . Not on file  Tobacco Use  . Smoking status: Current Every Day Smoker    Packs/day: 1.00    Types: Cigarettes  . Smokeless tobacco: Never Used  Substance and Sexual Activity  . Alcohol use: Yes  . Drug use: Not Currently    Comment: heroin multiple times a week  . Sexual activity: Not on file  Other Topics Concern  . Not on file  Social History Narrative  . Not on file   Social Determinants of Health   Financial Resource Strain:   . Difficulty of Paying Living Expenses: Not on file  Food Insecurity:   .  Worried About Programme researcher, broadcasting/film/video in the Last Year: Not on file  . Ran Out of Food in the Last Year: Not on file  Transportation Needs:   . Lack of Transportation (Medical): Not on file  . Lack of Transportation (Non-Medical): Not on file  Physical Activity:   . Days of Exercise per Week: Not on file  . Minutes of Exercise per Session: Not on file  Stress:   . Feeling of Stress : Not on file  Social Connections:   . Frequency of Communication with Friends and Family: Not on file  . Frequency of Social Gatherings with Friends and Family: Not on file   . Attends Religious Services: Not on file  . Active Member of Clubs or Organizations: Not on file  . Attends Banker Meetings: Not on file  . Marital Status: Not on file   Additional Social History:    Allergies:   Allergies  Allergen Reactions  . Penicillins Hives    Labs:  Results for orders placed or performed during the hospital encounter of 06/10/19 (from the past 48 hour(s))  Comprehensive metabolic panel     Status: Abnormal   Collection Time: 06/10/19  4:01 PM  Result Value Ref Range   Sodium 138 135 - 145 mmol/L   Potassium 3.6 3.5 - 5.1 mmol/L   Chloride 100 98 - 111 mmol/L   CO2 21 (L) 22 - 32 mmol/L   Glucose, Bld 114 (H) 70 - 99 mg/dL   BUN 13 6 - 20 mg/dL   Creatinine, Ser 1.57 0.61 - 1.24 mg/dL   Calcium 9.3 8.9 - 26.2 mg/dL   Total Protein 7.9 6.5 - 8.1 g/dL   Albumin 4.4 3.5 - 5.0 g/dL   AST 34 15 - 41 U/L   ALT 29 0 - 44 U/L   Alkaline Phosphatase 75 38 - 126 U/L   Total Bilirubin 0.8 0.3 - 1.2 mg/dL   GFR calc non Af Amer >60 >60 mL/min   GFR calc Af Amer >60 >60 mL/min   Anion gap 17 (H) 5 - 15    Comment: Performed at [facility], [address], [place], [place] [address]  Ethanol     Status: Abnormal   Collection Time: 06/10/19  4:01 PM  Result Value Ref Range   Alcohol, Ethyl (B) 99 (H) <10 mg/dL    Comment: (NOTE) Lowest detectable limit for serum alcohol is 10 mg/dL. For medical purposes only. Performed at [facility], [address] [place], [place] [address]   cbc     Status: Abnormal   Collection Time: 06/10/19  4:01 PM  Result Value Ref Range   WBC 6.1 4.0 - 10.5 K/uL   RBC 5.10 4.22 - 5.81 MIL/uL   Hemoglobin 15.7 13.0 - 17.0 g/dL   HCT 84.5 36.4 - 68.0 %   MCV 83.9 80.0 - 100.0 fL   MCH 30.8 26.0 - 34.0 pg   MCHC 36.7 (H) 30.0 - 36.0 g/dL   RDW 32.1 22.4 - 82.5 %   Platelets 263 150 - 400 K/uL   nRBC 0.0 0.0 - 0.2 %    Comment: Performed at [facility], [address]  [place], [place] [address]  Urine Drug Screen, Qualitative     Status: Abnormal   Collection Time: 06/10/19  4:50 PM  Result Value Ref Range   Tricyclic, Ur Screen NONE DETECTED NONE DETECTED   Amphetamines, Ur Screen POSITIVE (A) NONE DETECTED  MDMA (Ecstasy)Ur Screen NONE DETECTED NONE DETECTED   Cocaine Metabolite,Ur [place] NONE DETECTED NONE DETECTED   Opiate, Ur Screen NONE DETECTED NONE DETECTED   Phencyclidine (PCP) Ur S NONE DETECTED NONE DETECTED   Cannabinoid 50 Ng, Ur [place] NONE DETECTED NONE DETECTED   Barbiturates, Ur Screen NONE DETECTED NONE DETECTED   Benzodiazepine, Ur Scrn POSITIVE (A) NONE DETECTED   Methadone Scn, Ur NONE DETECTED NONE DETECTED    Comment: (NOTE) Tricyclics + metabolites, urine    Cutoff 1000 ng/mL Amphetamines + metabolites, urine  Cutoff 1000 ng/mL MDMA (Ecstasy), urine              Cutoff 500 ng/mL Cocaine Metabolite, urine          Cutoff 300 ng/mL Opiate + metabolites, urine        Cutoff 300 ng/mL Phencyclidine (PCP), urine         Cutoff 25 ng/mL Cannabinoid, urine                 Cutoff 50 ng/mL Barbiturates + metabolites, urine  Cutoff 200 ng/mL Benzodiazepine, urine              Cutoff 200 ng/mL Methadone, urine                   Cutoff 300 ng/mL The urine drug screen provides only a preliminary, unconfirmed analytical test result and should not be used for non-medical purposes. Clinical consideration and professional judgment should be applied to any positive drug screen result due to possible interfering substances. A more specific alternate chemical method must be used in order to obtain a confirmed analytical result. Gas chromatography / mass spectrometry (GC/MS) is the preferred confirmat ory method. Performed at [facility], [address] [place], [place] [address]     No current facility-administered medications for this encounter.   Current Outpatient Medications  Medication Sig Dispense Refill  . carvedilol (COREG)  6.25 MG tablet Take 1 tablet (6.25 mg total) by mouth 2 (two) times daily with a meal. 60 tablet 1  . famotidine (PEPCID) 20 MG tablet Take 1 tablet (20 mg total) by mouth 2 (two) times daily. 60 tablet 1  . lisinopril (ZESTRIL) 10 MG tablet Take 1 tablet (10 mg total) by mouth daily. 30 tablet 1  . LORazepam (ATIVAN) 1 MG tablet Take 1 tablet (1 mg total) by mouth 2 (two) times daily as needed for anxiety. 12 tablet 0    Musculoskeletal: Strength & Muscle Tone: within normal limits Gait & Station: normal Patient leans: N/A  Psychiatric Specialty Exam: Physical Exam  Nursing note and vitals reviewed. Constitutional: He is oriented to person, place, and time. He appears well-developed and well-nourished.  Respiratory: Effort normal.  Musculoskeletal:        General: Normal range of motion.  Neurological: He is alert and oriented to person, place, and time.  Skin: Skin is warm.    Review of Systems  Constitutional: Negative.   HENT: Negative.   Eyes: Negative.   Respiratory: Negative.   Cardiovascular: Negative.   Gastrointestinal: Negative.   Genitourinary: Negative.   Musculoskeletal: Negative.   Skin: Negative.   Neurological: Negative.   Psychiatric/Behavioral: Negative.     Blood pressure (!) 150/101, pulse (!) 115, temperature 98.5 F (36.9 C), temperature source Oral, resp. rate 20, height 5\' 11"  (1.803 m), weight 104.3 kg, SpO2 96 %.Body mass index is 32.08 kg/m.  General Appearance: Casual  Eye Contact:  Good  Speech:  Clear and Coherent and Normal Rate  Volume:  Normal  Mood:  Anxious  Affect:  Congruent  Thought Process:  Coherent and Descriptions of Associations: Intact  Orientation:  Full (Time, Place, and Person)  Thought Content:  WDL  Suicidal Thoughts:  No  Homicidal Thoughts:  No  Memory:  Immediate;   Good Recent;   Good Remote;   Good  Judgement:  Good  Insight:  Fair  Psychomotor Activity:  Normal  Concentration:  Concentration: Good  Recall:   Good  Fund of Knowledge:  Good  Language:  Good  Akathisia:  No  Handed:  Right  AIMS (if indicated):     Assets:  Communication Skills Desire for Improvement Housing Physical Health Social Support  ADL's:  Intact  Cognition:  WNL  Sleep:        Treatment Plan Summary: FOllow up with outpatient substance abuse facility  TTS providing resources Dr. Colon Branch providing script for Librium taper  Disposition: No evidence of imminent risk to self or others at present.   Patient does not meet criteria for psychiatric inpatient admission. Supportive therapy provided about ongoing stressors. Discussed crisis plan, support from social network, calling 911, coming to the Emergency Department, and calling Suicide Hotline.  Gerlene Burdock Oz Gammel, FNP 06/10/2019 5:54 PM

## 2019-06-10 NOTE — ED Notes (Addendum)
Pt calm and cooperative. Pt states that he tried to stop drinking and got some medication for his panic attacks recently from here but his wife threw it all away and he doesn't have anything left. Pt states that he just wants his panic attacks to stop. This RN explained to pt that he may not be admitted here but we'll let the psychiatrist decide what to do. Pt understands this. Pt mildly anxious at this time. Last drink was this morning.

## 2019-07-15 ENCOUNTER — Encounter: Payer: Self-pay | Admitting: Emergency Medicine

## 2019-07-15 ENCOUNTER — Other Ambulatory Visit: Payer: Self-pay

## 2019-07-15 ENCOUNTER — Emergency Department
Admission: EM | Admit: 2019-07-15 | Discharge: 2019-07-15 | Disposition: A | Discharge status: Home / Self Care | Payer: Self-pay | Attending: Student | Admitting: Student

## 2019-07-15 ENCOUNTER — Emergency Department: Payer: Self-pay

## 2019-07-15 DIAGNOSIS — F419 Anxiety disorder, unspecified: Secondary | ICD-10-CM | POA: Insufficient documentation

## 2019-07-15 DIAGNOSIS — R079 Chest pain, unspecified: Secondary | ICD-10-CM

## 2019-07-15 DIAGNOSIS — F17210 Nicotine dependence, cigarettes, uncomplicated: Secondary | ICD-10-CM | POA: Insufficient documentation

## 2019-07-15 DIAGNOSIS — Z789 Other specified health status: Secondary | ICD-10-CM

## 2019-07-15 DIAGNOSIS — Z7289 Other problems related to lifestyle: Secondary | ICD-10-CM

## 2019-07-15 DIAGNOSIS — Z79899 Other long term (current) drug therapy: Secondary | ICD-10-CM | POA: Insufficient documentation

## 2019-07-15 DIAGNOSIS — I10 Essential (primary) hypertension: Secondary | ICD-10-CM | POA: Insufficient documentation

## 2019-07-15 LAB — BASIC METABOLIC PANEL
Anion gap: 11 (ref 5–15)
BUN: 20 mg/dL (ref 6–20)
CO2: 26 mmol/L (ref 22–32)
Calcium: 9.1 mg/dL (ref 8.9–10.3)
Chloride: 105 mmol/L (ref 98–111)
Creatinine, Ser: 0.85 mg/dL (ref 0.61–1.24)
GFR calc Af Amer: >60 mL/min (ref >60)
GFR calc non Af Amer: >60 mL/min (ref >60)
Glucose, Bld: 108 mg/dL — ABNORMAL HIGH (ref 70–99)
Potassium: 3.8 mmol/L (ref 3.5–5.1)
Sodium: 142 mmol/L (ref 135–145)

## 2019-07-15 LAB — CBC WITH DIFFERENTIAL/PLATELET
Abs Immature Granulocytes: 0.02 10*3/uL (ref 0.00–0.07)
Basophils Absolute: 0.1 10*3/uL (ref 0.0–0.1)
Basophils Relative: 1 %
Eosinophils Absolute: 0.2 10*3/uL (ref 0.0–0.5)
Eosinophils Relative: 3 %
HCT: 46.1 % (ref 39.0–52.0)
Hemoglobin: 15.2 g/dL (ref 13.0–17.0)
Immature Granulocytes: 0 %
Lymphocytes Relative: 33 %
Lymphs Abs: 2 10*3/uL (ref 0.7–4.0)
MCH: 30.2 pg (ref 26.0–34.0)
MCHC: 33 g/dL (ref 30.0–36.0)
MCV: 91.7 fL (ref 80.0–100.0)
Monocytes Absolute: 0.6 10*3/uL (ref 0.1–1.0)
Monocytes Relative: 10 %
Neutro Abs: 3.1 10*3/uL (ref 1.7–7.7)
Neutrophils Relative %: 53 %
Platelets: 330 10*3/uL (ref 150–400)
RBC: 5.03 MIL/uL (ref 4.22–5.81)
RDW: 12.8 % (ref 11.5–15.5)
WBC: 6 10*3/uL (ref 4.0–10.5)
nRBC: 0 % (ref 0.0–0.2)

## 2019-07-15 LAB — TROPONIN I (HIGH SENSITIVITY)
Troponin I (High Sensitivity): 4 ng/L (ref <18)
Troponin I (High Sensitivity): 3 ng/L (ref <18)

## 2019-07-15 MED ORDER — LORAZEPAM 1 MG PO TABS: 1.0000 mg | ORAL_TABLET | Freq: Once | ORAL | Status: DC

## 2019-07-15 MED ORDER — LORAZEPAM 2 MG PO TABS
2.0000 mg | ORAL_TABLET | Freq: Once | ORAL | Status: AC
  Administered 2019-07-15: 2 mg via ORAL
  Filled 2019-07-15: qty 1

## 2019-07-15 MED ORDER — LORAZEPAM 1 MG PO TABS: 1.0000 mg | ORAL_TABLET | Freq: Two times a day (BID) | ORAL | 0 refills | Status: AC | PRN

## 2019-07-15 MED ORDER — ASPIRIN 81 MG PO CHEW
324.0000 mg | CHEWABLE_TABLET | Freq: Once | ORAL | Status: AC
  Administered 2019-07-15: 324 mg via ORAL
  Filled 2019-07-15: qty 4

## 2019-07-15 NOTE — ED Notes (Signed)
Pt discussed with Dr. Colon Branch.   Verbal orders given for CBC, BMP, and troponin.

## 2019-07-15 NOTE — ED Notes (Signed)
Pt presents to ED via POV with c/o generalized upper chest pain that started with sudden onset earlier this morning. Pt also c/o increased anxiety. Pt notedly anxious during assessment, pt with tremors noted to tongue and bilateral hands, pt also with noted flushing to face. Pt endorses drinking 7-8 beers/day, states last drank last night. CIWA performed by this RN, CIWA noted to be 9 at this time. EDP made aware.

## 2019-07-15 NOTE — ED Triage Notes (Signed)
Pt to ED via POV stating that he has been having anxiety attacks. Pt states that he has hx/o same. Pt states that about 1 hour ago he started having chest pain, pt states that he gets chest pain sometimes when he has anxiety attacks. Pt states that the chest pain makes him feel like something is wrong with him, which makes that anxiety worse. Pt appears anxious but is cooperative in triage.

## 2019-07-15 NOTE — ED Provider Notes (Signed)
[facility] Emergency Department Provider Note  ____________________________________________   First MD Initiated Contact with Patient 07/15/19 [phone]     (approximate)  I have reviewed the triage vital signs and the nursing notes.  History  Chief Complaint Anxiety    HPI Zachary Mendoza. is a 29 y.o. male with a history of hypertension, anxiety, alcohol abuse who presents to the emergency department for anxiety and chest pain.  Patient reports a history of anxiety attacks, which are frequently associated with chest pain.  This episode started about 7 AM.  Patient states that he gets chest pain when he has anxiety attacks, which only further worsens his symptoms of anxiety because he gets worried about his chest pain.  Chest pain is located across his anterior chest, 4/10 in severity, described as a pressure, tightness. Some associated SOB. No radiation.  Worsened by anxiety.  No alleviating components.  Constant since onset. Tried dramamine w/o improvement. Does admit to drinking 6-7 beers yesterday.  Drinks on a nearly daily basis, but admits to drinking heavily over the last few days.  No nausea, vomiting, diarrhea, fever, chills, body aches. The symptoms are similar to prior episodes of anxiety attacks.  Denies any SI or depression.  Denies any history of complicated withdrawals.   Past Medical Hx Past Medical History:  Diagnosis Date  . Hypertension   . Panic attacks     Problem List Patient Active Problem List   Diagnosis Date Noted  . Alcohol abuse 03/29/2019  . Severe anxiety with panic 03/28/2019  . Alcoholic intoxication with complication [facility])     Past Surgical Hx History reviewed. No pertinent surgical history.  Medications Prior to Admission medications   Medication Sig Start Date End Date Taking? Authorizing Provider  carvedilol (COREG) 6.25 MG tablet Take 1 tablet (6.25 mg total) by mouth 2 (two) times daily with a meal. 03/30/19    Clapacs, Madie Reno, MD  chlordiazePOXIDE (LIBRIUM) 25 MG capsule Day 1: take 50 mg (2 capsules) in AM, afternoon, and evening. Day 2: take 50 mg (2 capsules) AM and PM. Day 3: take 50 mg (2 capsules) in AM 06/10/19   Lilia Pro., MD  famotidine (PEPCID) 20 MG tablet Take 1 tablet (20 mg total) by mouth 2 (two) times daily. 03/30/19   Clapacs, Madie Reno, MD  lisinopril (ZESTRIL) 10 MG tablet Take 1 tablet (10 mg total) by mouth daily. 03/31/19   Clapacs, Madie Reno, MD  LORazepam (ATIVAN) 1 MG tablet Take 1 tablet (1 mg total) by mouth 2 (two) times daily as needed for anxiety. 06/06/19 06/05/20  Arta Silence, MD    Allergies Penicillins  Family Hx No family history on file.  Social Hx Social History   Tobacco Use  . Smoking status: Current Every Day Smoker    Packs/day: 1.00    Types: Cigarettes  . Smokeless tobacco: Never Used  Substance Use Topics  . Alcohol use: Yes  . Drug use: Not Currently    Comment: heroin multiple times a week     Review of Systems  Constitutional: Negative for fever, chills.  Positive for anxiety. Eyes: Negative for visual changes. ENT: Negative for sore throat. Cardiovascular: Positive for chest pain. Respiratory: Negative for shortness of breath. Gastrointestinal: Negative for nausea, vomiting.  Genitourinary: Negative for dysuria. Musculoskeletal: Negative for leg swelling. Skin: Negative for rash. Neurological: Negative for headaches.   Physical Exam  Vital Signs: ED Triage Vitals  Enc Vitals Group  BP 07/15/19 0845 139/85     Pulse Rate 07/15/19 0845 86     Resp 07/15/19 0845 18     Temp 07/15/19 0845 98.7 F (37.1 C)     Temp Source 07/15/19 0845 Oral     SpO2 07/15/19 0845 100 %     Weight 07/15/19 0842 225 lb (102.1 kg)     Height 07/15/19 0842 5\' 11"  (1.803 m)     Head Circumference --      Peak Flow --      Pain Score 07/15/19 0841 4     Pain Loc --      Pain Edu? --      Excl. in [place]? --     Constitutional: Alert  and oriented.  Head: Normocephalic. Atraumatic. Eyes: Conjunctivae clear. Sclera anicteric. Nose: No congestion. No rhinorrhea. Mouth/Throat: Wearing mask.  Neck: No stridor.   Cardiovascular: Normal rate, regular rhythm. Extremities well perfused. Respiratory: Normal respiratory effort.  Lungs CTAB. Gastrointestinal: Soft. Non-tender. Non-distended.  Musculoskeletal: No lower extremity edema. No deformities. Neurologic:  Normal speech and language. No gross focal neurologic deficits are appreciated.  Skin: Skin is warm, dry and intact. No rash noted. Psychiatric: Somewhat anxious, but calm and cooperative and appropriate.  EKG  Personally reviewed.   Rate: 83 Rhythm: sinus Axis: normal Intervals: WNL TWI in III, seen previously No acute changes compared to prior No STEMI    Radiology  CXR: FINDINGS:  Grossly unchanged cardiac silhouette and mediastinal contours given  AP projection and portable technique. Mild pulmonary venous  congestion without frank evidence of edema. Minimal perihilar  atelectasis. No discrete focal airspace opacities. No pleural  effusion or pneumothorax. No acute osseous abnormalities.   IMPRESSION:  Minimal pulmonary venous congestion and perihilar atelectasis  without definitive superimposed acute cardiopulmonary disease on  this AP portable examination.    Procedures  Procedure(s) performed (including critical care):  Procedures   Initial Impression / Assessment and Plan / ED Course  29 y.o. male who presents to the ED for anxiety and chest pain.  Ddx: anxiety, atypical ACS, pleurisy, MSK.  Per his report, these symptoms are identical to and unchanged from prior episodes of anxiety attacks.  Suspect today's presentation is the same. Likely worsened by alcohol use, perhaps element of mild withdrawal. No evidence of complicated w/d at this time - no significant HTN, tachycardia, diaphoresis.  Obtain basic labs, EKG, imaging, symptomatic  care and reassess.  EKG without acute ischemic changes, unchanged from prior.  Troponin x 2 negative.  CXR without focal opacities or pneumothorax.  Presentation seems most consistent with history of anxiety, likely some component of mild alcohol withdrawal as well.  Does report mild improvement after Ativan.  States he will work on decreasing his alcohol use, but does not feel that complete cessation is possible at this time.  He is motivated to see his PCP within the next week to discuss his symptoms and potentially get started on a more long-acting SSRI for help with his anxiety symptoms.  Encouraged this.  Given return precautions.  Given short prescription for Ativan as needed as bridge to his PCP appointment.   Final Clinical Impression(s) / ED Diagnosis  Final diagnoses:  Anxiety  Chest pain in adult       Note:  This document was prepared using Dragon voice recognition software and may include unintentional dictation errors.   38., MD 07/15/19 1130

## 2019-07-15 NOTE — Discharge Instructions (Addendum)
Thank you for letting us take care of you in the emergency department today.   Please continue to take any regular, prescribed medications. As best you can, try to decrease your alcohol use as this is likely contributing to and worsening your anxiety symptoms.   New medications we have prescribed:  - Ativan - take as needed for anxiety, shakiness  Please follow up with: - Your primary care doctor to review your ER visit and follow up on your symptoms.   Please return to the ER for any new or worsening symptoms.

## 2019-07-15 NOTE — ED Notes (Signed)
NAD noted at time of D/C. Pt denies questions or concerns. Pt ambulatory to the lobby at this time to meet his ride. EDP aware of BP, pt instructed to follow up with PCP for BP and for further eval for quitting drinking per EDP advise. Pt states understanding. Pt given [facility] card for for prescriptions.

## 2019-07-18 ENCOUNTER — Encounter: Payer: Self-pay | Admitting: Emergency Medicine

## 2019-07-18 ENCOUNTER — Emergency Department
Admission: EM | Admit: 2019-07-18 | Discharge: 2019-07-19 | Disposition: A | Discharge status: Home / Self Care | Payer: Self-pay | Attending: Emergency Medicine | Admitting: Emergency Medicine

## 2019-07-18 DIAGNOSIS — F10280 Alcohol dependence with alcohol-induced anxiety disorder: Secondary | ICD-10-CM | POA: Insufficient documentation

## 2019-07-18 DIAGNOSIS — Z79899 Other long term (current) drug therapy: Secondary | ICD-10-CM | POA: Insufficient documentation

## 2019-07-18 DIAGNOSIS — I10 Essential (primary) hypertension: Secondary | ICD-10-CM | POA: Insufficient documentation

## 2019-07-18 DIAGNOSIS — K2920 Alcoholic gastritis without bleeding: Secondary | ICD-10-CM | POA: Insufficient documentation

## 2019-07-18 DIAGNOSIS — F17210 Nicotine dependence, cigarettes, uncomplicated: Secondary | ICD-10-CM | POA: Insufficient documentation

## 2019-07-18 LAB — CBC
HCT: 46.9 % (ref 39.0–52.0)
Hemoglobin: 15.9 g/dL (ref 13.0–17.0)
MCH: 30.2 pg (ref 26.0–34.0)
MCHC: 33.9 g/dL (ref 30.0–36.0)
MCV: 89 fL (ref 80.0–100.0)
Platelets: 368 10*3/uL (ref 150–400)
RBC: 5.27 MIL/uL (ref 4.22–5.81)
RDW: 12.6 % (ref 11.5–15.5)
WBC: 6.7 10*3/uL (ref 4.0–10.5)
nRBC: 0 % (ref 0.0–0.2)

## 2019-07-18 LAB — COMPREHENSIVE METABOLIC PANEL
ALT: 61 U/L — ABNORMAL HIGH (ref 0–44)
AST: 48 U/L — ABNORMAL HIGH (ref 15–41)
Albumin: 4.9 g/dL (ref 3.5–5.0)
Alkaline Phosphatase: 64 U/L (ref 38–126)
Anion gap: 14 (ref 5–15)
BUN: 21 mg/dL — ABNORMAL HIGH (ref 6–20)
CO2: 29 mmol/L (ref 22–32)
Calcium: 9.1 mg/dL (ref 8.9–10.3)
Chloride: 101 mmol/L (ref 98–111)
Creatinine, Ser: 0.94 mg/dL (ref 0.61–1.24)
GFR calc Af Amer: >60 mL/min (ref >60)
GFR calc non Af Amer: >60 mL/min (ref >60)
Glucose, Bld: 143 mg/dL — ABNORMAL HIGH (ref 70–99)
Potassium: 3.6 mmol/L (ref 3.5–5.1)
Sodium: 144 mmol/L (ref 135–145)
Total Bilirubin: 0.5 mg/dL (ref 0.3–1.2)
Total Protein: 8.3 g/dL — ABNORMAL HIGH (ref 6.5–8.1)

## 2019-07-18 LAB — LIPASE, BLOOD: Lipase: 29 U/L (ref 11–51)

## 2019-07-18 LAB — ETHANOL: Alcohol, Ethyl (B): 207 mg/dL — ABNORMAL HIGH (ref <10)

## 2019-07-18 NOTE — ED Triage Notes (Signed)
Pt reports he was seen in ED on 1/31 for panic attacks and was given ativan. Pt took last ativan this AM at 0400 and this evening pt started to have panic attacks and did not have medication to take, so pt used alcohol. Pt reports he has consumed 1 1/2 pints of liquor today. Pt to ED tonight due to emesis following consumption of ETOH. 20 emesis episodes reported.

## 2019-07-18 NOTE — ED Notes (Signed)
First Nurse Note;  Pt in via [facility] from home with complaints of emesis.  Per EMS report, pt admits to having a pint of Brandy and one [employer] [place]oco today.  Pt ambulatory to bathroom without difficulty upon arrival, NAD noted at this time.

## 2019-07-19 MED ORDER — CHLORDIAZEPOXIDE HCL 25 MG PO CAPS
50.0000 mg | ORAL_CAPSULE | Freq: Once | ORAL | Status: AC
  Administered 2019-07-19: 50 mg via ORAL
  Filled 2019-07-19: qty 2

## 2019-07-19 MED ORDER — LIDOCAINE VISCOUS HCL 2 % MT SOLN
15.0000 mL | Freq: Once | OROMUCOSAL | Status: AC
  Administered 2019-07-19: 15 mL via ORAL
  Filled 2019-07-19: qty 15

## 2019-07-19 MED ORDER — ONDANSETRON 4 MG PO TBDP: 4.0000 mg | ORAL_TABLET | Freq: Three times a day (TID) | ORAL | 0 refills | Status: DC | PRN

## 2019-07-19 MED ORDER — ONDANSETRON 4 MG PO TBDP
4.0000 mg | ORAL_TABLET | Freq: Once | ORAL | Status: AC
  Administered 2019-07-19: 4 mg via ORAL
  Filled 2019-07-19: qty 1

## 2019-07-19 MED ORDER — ALUM & MAG HYDROXIDE-SIMETH 200-200-20 MG/5ML PO SUSP
30.0000 mL | Freq: Once | ORAL | Status: AC
  Administered 2019-07-19: 30 mL via ORAL
  Filled 2019-07-19: qty 30

## 2019-07-19 NOTE — ED Provider Notes (Signed)
[facility] Emergency Department Provider Note  ____________________________________________  Time seen: Approximately 12:32 AM  I have reviewed the triage vital signs and the nursing notes.   HISTORY  Chief Complaint Emesis   HPI Zachary L Jerrett Montez Hageman. is a 29 y.o. male history of alcohol abuse and anxiety who presents for evaluation of nausea and vomiting.  Patient reports drinking 1.5 pints of liquor daily.  Last alcohol intake was 5 PM.  No history of complicated withdrawals.  Patient reports that he started vomiting this afternoon and therefore has been unable to keep any alcohol down.  He feels very shaky, nauseous, and very anxious at this time.  Is also complaining of burning in his epigastric region and chest since he started vomiting.  No hematemesis or coffee-ground emesis.  No diarrhea, no hallucinations.  Patient does not wish detox.   Past Medical History:  Diagnosis Date  . Hypertension   . Panic attacks     Patient Active Problem List   Diagnosis Date Noted  . Alcohol abuse 03/29/2019  . Severe anxiety with panic 03/28/2019  . Alcoholic intoxication with complication (HCC)     History reviewed. No pertinent surgical history.  Prior to Admission medications   Medication Sig Start Date End Date Taking? Authorizing Provider  carvedilol (COREG) 6.25 MG tablet Take 1 tablet (6.25 mg total) by mouth 2 (two) times daily with a meal. 03/30/19   Clapacs, Jackquline Denmark, MD  chlordiazePOXIDE (LIBRIUM) 25 MG capsule Day 1: take 50 mg (2 capsules) in AM, afternoon, and evening. Day 2: take 50 mg (2 capsules) AM and PM. Day 3: take 50 mg (2 capsules) in AM 06/10/19   Miguel Aschoff., MD  famotidine (PEPCID) 20 MG tablet Take 1 tablet (20 mg total) by mouth 2 (two) times daily. 03/30/19   Clapacs, Jackquline Denmark, MD  lisinopril (ZESTRIL) 10 MG tablet Take 1 tablet (10 mg total) by mouth daily. 03/31/19   Clapacs, Jackquline Denmark, MD  LORazepam (ATIVAN) 1 MG tablet Take 1 tablet  (1 mg total) by mouth 2 (two) times daily as needed for anxiety. 06/06/19 06/05/20  Dionne Bucy, MD  ondansetron (ZOFRAN ODT) 4 MG disintegrating tablet Take 1 tablet (4 mg total) by mouth every 8 (eight) hours as needed. 07/19/19   Nita Sickle, MD    Allergies Penicillins  History reviewed. No pertinent family history.  Social History Social History   Tobacco Use  . Smoking status: Current Every Day Smoker    Packs/day: 1.00    Types: Cigarettes  . Smokeless tobacco: Never Used  Substance Use Topics  . Alcohol use: Yes    Comment: 2-3 pints per day  . Drug use: Not Currently    Comment: heroin multiple times a week    Review of Systems  Constitutional: Negative for fever. Eyes: Negative for visual changes. ENT: Negative for sore throat. Neck: No neck pain  Cardiovascular: Negative for chest pain. Respiratory: Negative for shortness of breath. Gastrointestinal: Negative for abdominal pain, diarrhea. + N/ V Genitourinary: Negative for dysuria. Musculoskeletal: Negative for back pain. Skin: Negative for rash. Neurological: Negative for headaches, weakness or numbness. Psych: No SI or HI. + anxious  ____________________________________________   PHYSICAL EXAM:  VITAL SIGNS: ED Triage Vitals [07/18/19 2059]  Enc Vitals Group     BP (!) 159/102     Pulse Rate 96     Resp 18     Temp 99 F (37.2 C)     Temp  Source Oral     SpO2 98 %     Weight      Height      Head Circumference      Peak Flow      Pain Score      Pain Loc      Pain Edu?      Excl. in [place]?     Constitutional: Alert and oriented, looks anxious.  HEENT:      Head: Normocephalic and atraumatic.         Eyes: Conjunctivae are normal. Sclera is non-icteric.       Mouth/Throat: Mucous membranes are moist.       Neck: Supple with no signs of meningismus. Cardiovascular: Regular rate and rhythm. No murmurs, gallops, or rubs.  Respiratory: Normal respiratory effort. Lungs are clear  to auscultation bilaterally. No wheezes, crackles, or rhonchi.  Gastrointestinal: Soft, non tender, and non distended with positive bowel sounds. No rebound or guarding. Musculoskeletal: Nontender with normal range of motion in all extremities. No edema, cyanosis, or erythema of extremities. Neurologic: Normal speech and language. Face is symmetric. Moving all extremities. No gross focal neurologic deficits are appreciated. Skin: Skin is warm, dry and intact. No rash noted. Psychiatric: Mood and affect are normal. Speech and behavior are normal.  ____________________________________________   LABS (all labs ordered are listed, but only abnormal results are displayed)  Labs Reviewed  COMPREHENSIVE METABOLIC PANEL - Abnormal; Notable for the following components:      Result Value   Glucose, Bld 143 (*)    BUN 21 (*)    Total Protein 8.3 (*)    AST 48 (*)    ALT 61 (*)    All other components within normal limits  ETHANOL - Abnormal; Notable for the following components:   Alcohol, Ethyl (B) 207 (*)    All other components within normal limits  LIPASE, BLOOD  CBC  URINALYSIS, COMPLETE (UACMP) WITH MICROSCOPIC   ____________________________________________  EKG  none  ____________________________________________  RADIOLOGY  none  ____________________________________________   PROCEDURES  Procedure(s) performed: None Procedures Critical Care performed:  None ____________________________________________   INITIAL IMPRESSION / ASSESSMENT AND PLAN / ED COURSE  29 y.o. male history of alcohol abuse and anxiety who presents for evaluation of nausea and vomiting.  Patient with daily intake of 1.5 pints of liquor.  Looks anxious at this time. CIWA score of 4. Labs showing no significant electrolyte abnormalities, AKI or signs of dehydration.  Patient does have mildly elevated LFTs which is expected considering the amount of alcohol that he drinks daily.  Lipase within normal  limits.  Alcohol level of 207.  Will give a GI cocktail, Zofran, and Librium.  Offered detox however patient has declined.  Presentation concerning for alcoholic gastritis.    _________________________ 1:53 AM on 07/19/2019 -----------------------------------------  Patient has been sleeping for an hour with no signs of active withdrawal.  Is tolerating p.o. with no further episodes of vomiting.  Discussed findings of elevated LFTs with patient and highly encourage the patient tries to get sober.  Again offered detox which patient declined.  Will provide with a prescription for Zofran.  Discussed my standard return precautions and follow-up with PCP.    As part of my medical decision making, I reviewed the following data within the Blue notes reviewed and incorporated, Labs reviewed, Old chart reviewed, Radiograph reviewed , Notes from prior ED visits and [place] Controlled Substance Database   Please note:  Patient was evaluated in Emergency Department today for the symptoms described in the history of present illness. Patient was evaluated in the context of the global COVID-19 pandemic, which necessitated consideration that the patient might be at risk for infection with the SARS-CoV-2 virus that causes COVID-19. Institutional protocols and algorithms that pertain to the evaluation of patients at risk for COVID-19 are in a state of rapid change based on information released by regulatory bodies including the CDC and federal and state organizations. These policies and algorithms were followed during the patient's care in the ED.  Some ED evaluations and interventions may be delayed as a result of limited staffing during the pandemic.   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    Don Perking, [place], MD 07/19/19 [phone]

## 2019-07-29 DIAGNOSIS — F902 Attention-deficit hyperactivity disorder, combined type: Secondary | ICD-10-CM | POA: Insufficient documentation

## 2019-07-29 DIAGNOSIS — Z8659 Personal history of other mental and behavioral disorders: Secondary | ICD-10-CM | POA: Insufficient documentation

## 2019-08-09 DIAGNOSIS — F17200 Nicotine dependence, unspecified, uncomplicated: Secondary | ICD-10-CM | POA: Insufficient documentation

## 2019-08-31 ENCOUNTER — Emergency Department: Payer: Self-pay

## 2019-08-31 ENCOUNTER — Encounter: Payer: Self-pay | Admitting: Emergency Medicine

## 2019-08-31 ENCOUNTER — Emergency Department
Admission: EM | Admit: 2019-08-31 | Discharge: 2019-08-31 | Disposition: A | Discharge status: Home / Self Care | Payer: Self-pay | Attending: Emergency Medicine | Admitting: Emergency Medicine

## 2019-08-31 DIAGNOSIS — F17210 Nicotine dependence, cigarettes, uncomplicated: Secondary | ICD-10-CM | POA: Insufficient documentation

## 2019-08-31 DIAGNOSIS — Y906 Blood alcohol level of 120-199 mg/100 ml: Secondary | ICD-10-CM | POA: Insufficient documentation

## 2019-08-31 DIAGNOSIS — I10 Essential (primary) hypertension: Secondary | ICD-10-CM | POA: Insufficient documentation

## 2019-08-31 DIAGNOSIS — Z79899 Other long term (current) drug therapy: Secondary | ICD-10-CM | POA: Insufficient documentation

## 2019-08-31 DIAGNOSIS — F10930 Alcohol use, unspecified with withdrawal, uncomplicated: Secondary | ICD-10-CM

## 2019-08-31 DIAGNOSIS — F10230 Alcohol dependence with withdrawal, uncomplicated: Secondary | ICD-10-CM | POA: Insufficient documentation

## 2019-08-31 DIAGNOSIS — R197 Diarrhea, unspecified: Secondary | ICD-10-CM | POA: Insufficient documentation

## 2019-08-31 DIAGNOSIS — F410 Panic disorder [episodic paroxysmal anxiety] without agoraphobia: Secondary | ICD-10-CM | POA: Insufficient documentation

## 2019-08-31 DIAGNOSIS — R251 Tremor, unspecified: Secondary | ICD-10-CM | POA: Insufficient documentation

## 2019-08-31 LAB — CBC WITH DIFFERENTIAL/PLATELET
Abs Immature Granulocytes: 0.04 10*3/uL (ref 0.00–0.07)
Basophils Absolute: 0.1 10*3/uL (ref 0.0–0.1)
Basophils Relative: 0 %
Eosinophils Absolute: 0 10*3/uL (ref 0.0–0.5)
Eosinophils Relative: 0 %
HCT: 44.5 % (ref 39.0–52.0)
Hemoglobin: 15.3 g/dL (ref 13.0–17.0)
Immature Granulocytes: 0 %
Lymphocytes Relative: 19 %
Lymphs Abs: 2.1 10*3/uL (ref 0.7–4.0)
MCH: 30.3 pg (ref 26.0–34.0)
MCHC: 34.4 g/dL (ref 30.0–36.0)
MCV: 88.1 fL (ref 80.0–100.0)
Monocytes Absolute: 1.3 10*3/uL — ABNORMAL HIGH (ref 0.1–1.0)
Monocytes Relative: 11 %
Neutro Abs: 7.9 10*3/uL — ABNORMAL HIGH (ref 1.7–7.7)
Neutrophils Relative %: 70 %
Platelets: 346 10*3/uL (ref 150–400)
RBC: 5.05 MIL/uL (ref 4.22–5.81)
RDW: 12.2 % (ref 11.5–15.5)
WBC: 11.4 10*3/uL — ABNORMAL HIGH (ref 4.0–10.5)
nRBC: 0 % (ref 0.0–0.2)

## 2019-08-31 LAB — COMPREHENSIVE METABOLIC PANEL
ALT: 77 U/L — ABNORMAL HIGH (ref 0–44)
AST: 71 U/L — ABNORMAL HIGH (ref 15–41)
Albumin: 4.4 g/dL (ref 3.5–5.0)
Alkaline Phosphatase: 67 U/L (ref 38–126)
Anion gap: 16 — ABNORMAL HIGH (ref 5–15)
BUN: 19 mg/dL (ref 6–20)
CO2: 27 mmol/L (ref 22–32)
Calcium: 8.8 mg/dL — ABNORMAL LOW (ref 8.9–10.3)
Chloride: 97 mmol/L — ABNORMAL LOW (ref 98–111)
Creatinine, Ser: 0.83 mg/dL (ref 0.61–1.24)
GFR calc Af Amer: >60 mL/min (ref >60)
GFR calc non Af Amer: >60 mL/min (ref >60)
Glucose, Bld: 154 mg/dL — ABNORMAL HIGH (ref 70–99)
Potassium: 3.3 mmol/L — ABNORMAL LOW (ref 3.5–5.1)
Sodium: 140 mmol/L (ref 135–145)
Total Bilirubin: 0.9 mg/dL (ref 0.3–1.2)
Total Protein: 7.8 g/dL (ref 6.5–8.1)

## 2019-08-31 LAB — ETHANOL: Alcohol, Ethyl (B): 167 mg/dL — ABNORMAL HIGH (ref <10)

## 2019-08-31 LAB — LIPASE, BLOOD: Lipase: 38 U/L (ref 11–51)

## 2019-08-31 MED ORDER — CHLORDIAZEPOXIDE HCL 25 MG PO CAPS: ORAL_CAPSULE | ORAL | 0 refills | Status: AC

## 2019-08-31 MED ORDER — LORAZEPAM 1 MG PO TABS
1.0000 mg | ORAL_TABLET | Freq: Once | ORAL | Status: AC
  Administered 2019-08-31: 1 mg via ORAL
  Filled 2019-08-31: qty 1

## 2019-08-31 MED ORDER — ONDANSETRON 4 MG PO TBDP: 4.0000 mg | ORAL_TABLET | Freq: Three times a day (TID) | ORAL | 0 refills | Status: DC | PRN

## 2019-08-31 MED ORDER — ONDANSETRON HCL 4 MG/2ML IJ SOLN: 4.0000 mg | Freq: Once | INTRAMUSCULAR | Status: AC

## 2019-08-31 MED ORDER — LORAZEPAM 2 MG/ML IJ SOLN
INTRAMUSCULAR | Status: AC
  Administered 2019-08-31: 2 mg via INTRAVENOUS
  Filled 2019-08-31: qty 1

## 2019-08-31 MED ORDER — ONDANSETRON HCL 4 MG/2ML IJ SOLN
INTRAMUSCULAR | Status: AC
  Administered 2019-08-31: 4 mg via INTRAVENOUS
  Filled 2019-08-31: qty 2

## 2019-08-31 MED ORDER — ALUM & MAG HYDROXIDE-SIMETH 200-200-20 MG/5ML PO SUSP
30.0000 mL | Freq: Once | ORAL | Status: AC
  Administered 2019-08-31: 30 mL via ORAL
  Filled 2019-08-31: qty 30

## 2019-08-31 MED ORDER — LORAZEPAM 2 MG/ML IJ SOLN
0.0000 mg | Freq: Four times a day (QID) | INTRAMUSCULAR | Status: DC
  Administered 2019-08-31 (×2): 2 mg via INTRAVENOUS
  Filled 2019-08-31 (×2): qty 1

## 2019-08-31 MED ORDER — THIAMINE HCL 100 MG PO TABS
100.0000 mg | ORAL_TABLET | Freq: Every day | ORAL | Status: DC
  Filled 2019-08-31: qty 1

## 2019-08-31 MED ORDER — LIDOCAINE VISCOUS HCL 2 % MT SOLN
15.0000 mL | Freq: Once | OROMUCOSAL | Status: AC
  Administered 2019-08-31: 15 mL via ORAL
  Filled 2019-08-31: qty 15

## 2019-08-31 MED ORDER — SODIUM CHLORIDE 0.9 % IV BOLUS
1000.0000 mL | Freq: Once | INTRAVENOUS | Status: AC
  Administered 2019-08-31: 1000 mL via INTRAVENOUS

## 2019-08-31 MED ORDER — CHLORDIAZEPOXIDE HCL 25 MG PO CAPS: 25.0000 mg | ORAL_CAPSULE | Freq: Three times a day (TID) | ORAL | 0 refills | Status: DC | PRN

## 2019-08-31 NOTE — Discharge Instructions (Addendum)
1.  Take the Librium as prescribed. Start this today. 2.  You may take Zofran as needed for nausea/vomiting. 3.  Return to the ER for worsening symptoms, persistent vomiting, difficulty breathing or other concerns.

## 2019-08-31 NOTE — ED Notes (Signed)
Per Dr. Erma Heritage give pt 2mg  Ativan IV stat for CIWA of 9. PT given sandwich tray, cranberry juice and warm blanket

## 2019-08-31 NOTE — ED Triage Notes (Signed)
Pt arrived via EMS from home where he rolled out of his bed and landed on hardwood surface. Fall was on 3/18 AM. Pt called EMS this AM due to neck pain and ETOH withdrawal. Pt reports last drink was last night. MD at bedside.

## 2019-08-31 NOTE — ED Notes (Signed)
Dr Isaacs at bedside 

## 2019-08-31 NOTE — ED Provider Notes (Signed)
[facility] Emergency Department Provider Note   ____________________________________________   First MD Initiated Contact with Patient 08/31/19 [phone]     (approximate)  I have reviewed the triage vital signs and the nursing notes.   HISTORY  Chief Complaint Alcohol Problem    HPI Billyjoe L Lisenby Brooke Bonito. is a 29 y.o. male brought to the ED via EMS with a chief complaint of alcohol withdrawals. Patient reports he is a heavy drinker who recently was on a 3-day binge.  Last drink was yesterday evening.  Tried to detox himself.  States he rolled out of his bed yesterday morning 3/18 and struck his head on a hardwood surface.  Did not lose consciousness.  Called EMS tonight because he claims he has vomited 40 times and is tremulous.  No prior history of DTs.  Also endorses diarrhea and panic attacks.  Denies fever, cough, chest pain, shortness of breath.  Denies SI/HI/AH/VH.         Past Medical History:  Diagnosis Date  . Hypertension   . Panic attacks     Patient Active Problem List   Diagnosis Date Noted  . Alcohol abuse 03/29/2019  . Severe anxiety with panic 03/28/2019  . Alcoholic intoxication with complication ([place])     History reviewed. No pertinent surgical history.  Prior to Admission medications   Medication Sig Start Date End Date Taking? Authorizing Provider  carvedilol (COREG) 6.25 MG tablet Take 1 tablet (6.25 mg total) by mouth 2 (two) times daily with a meal. 03/30/19   Clapacs, Madie Reno, MD  chlordiazePOXIDE (LIBRIUM) 25 MG capsule Take 1 capsule (25 mg total) by mouth 3 (three) times daily as needed for withdrawal. 08/31/19   Paulette Blanch, MD  famotidine (PEPCID) 20 MG tablet Take 1 tablet (20 mg total) by mouth 2 (two) times daily. 03/30/19   Clapacs, Madie Reno, MD  lisinopril (ZESTRIL) 10 MG tablet Take 1 tablet (10 mg total) by mouth daily. 03/31/19   Clapacs, Madie Reno, MD  LORazepam (ATIVAN) 1 MG tablet Take 1 tablet (1 mg total) by mouth 2  (two) times daily as needed for anxiety. 06/06/19 06/05/20  Arta Silence, MD  ondansetron (ZOFRAN ODT) 4 MG disintegrating tablet Take 1 tablet (4 mg total) by mouth every 8 (eight) hours as needed for nausea or vomiting. 08/31/19   Paulette Blanch, MD    Allergies Penicillins  History reviewed. No pertinent family history.  Social History Social History   Tobacco Use  . Smoking status: Current Every Day Smoker    Packs/day: 1.00    Types: Cigarettes  . Smokeless tobacco: Never Used  Substance Use Topics  . Alcohol use: Yes    Comment: 2-3 pints per day  . Drug use: Not Currently    Comment: heroin multiple times a week    Review of Systems  Constitutional: No fever/chills Eyes: No visual changes. ENT: No sore throat. Cardiovascular: Denies chest pain. Respiratory: Denies shortness of breath. Gastrointestinal: No abdominal pain.  Positive for nausea and vomiting.  No diarrhea.  No constipation. Genitourinary: Negative for dysuria. Musculoskeletal: Negative for back pain. Skin: Negative for rash. Neurological: Negative for headaches, focal weakness or numbness. Psychiatric:  Positive for anxiety.  ____________________________________________   PHYSICAL EXAM:  VITAL SIGNS: ED Triage Vitals  Enc Vitals Group     BP      Pulse      Resp      Temp      Temp src  SpO2      Weight      Height      Head Circumference      Peak Flow      Pain Score      Pain Loc      Pain Edu?      Excl. in [place]?     Constitutional: Alert and oriented. Well appearing and in mild acute distress. Eyes: Conjunctivae are normal. PERRL. EOMI. Head: Atraumatic. Nose: Atraumatic. Mouth/Throat: Mucous membranes are moist.  No dental malocclusion. Neck: No stridor.  No cervical spine tenderness to palpation. Cardiovascular: Tachycardic rate, regular rhythm. Grossly normal heart sounds.  Good peripheral circulation. Respiratory: Normal respiratory effort.  No retractions. Lungs  CTAB. Gastrointestinal: Soft and nontender to light or deep palpation. No distention. No abdominal bruits. No CVA tenderness. Musculoskeletal: No lower extremity tenderness nor edema.  No joint effusions. Neurologic:  Normal speech and language. No gross focal neurologic deficits are appreciated.  Skin:  Skin is warm, dry and intact. No rash noted. Psychiatric: Mood and affect are anxious. Speech and behavior are normal.  ____________________________________________   LABS (all labs ordered are listed, but only abnormal results are displayed)  Labs Reviewed  CBC WITH DIFFERENTIAL/PLATELET - Abnormal; Notable for the following components:      Result Value   WBC 11.4 (*)    Neutro Abs 7.9 (*)    Monocytes Absolute 1.3 (*)    All other components within normal limits  COMPREHENSIVE METABOLIC PANEL - Abnormal; Notable for the following components:   Potassium 3.3 (*)    Chloride 97 (*)    Glucose, Bld 154 (*)    Calcium 8.8 (*)    AST 71 (*)    ALT 77 (*)    Anion gap 16 (*)    All other components within normal limits  ETHANOL - Abnormal; Notable for the following components:   Alcohol, Ethyl (B) 167 (*)    All other components within normal limits  LIPASE, BLOOD   ____________________________________________  EKG  ED ECG REPORT I, Noma Quijas J, the attending physician, personally viewed and interpreted this ECG.   Date: 08/31/2019  EKG Time: 0617  Rate: 113  Rhythm: sinus tachycardia  Axis: Normal  Intervals:none  ST&T Change: Nonspecific  ____________________________________________  RADIOLOGY  ED MD interpretation: CT head/cervical spine demonstrate no acute traumatic injuries  Official radiology report(s): CT Head Wo Contrast  Result Date: 08/31/2019 CLINICAL DATA:  Rolled out of bed and landed on hardwood surface EXAM: CT HEAD WITHOUT CONTRAST CT CERVICAL SPINE WITHOUT CONTRAST TECHNIQUE: Multidetector CT imaging of the head and cervical spine was performed  following the standard protocol without intravenous contrast. Multiplanar CT image reconstructions of the cervical spine were also generated. COMPARISON:  CT head and cervical spine 03/01/2016 FINDINGS: CT HEAD FINDINGS Brain: No evidence of acute infarction, hemorrhage, hydrocephalus, extra-axial collection or mass lesion/mass effect. Hypoattenuation across the medulla secondary to streak artifact. Vascular: No hyperdense vessel or unexpected calcification. Skull: No calvarial fracture or suspicious osseous lesion. No scalp swelling or hematoma. Sinuses/Orbits: Paranasal sinuses and mastoid air cells are predominantly clear. Included orbital structures are unremarkable. Other: None CT CERVICAL SPINE FINDINGS Alignment: Preservation of the normal cervical lordosis without traumatic listhesis. No abnormal facet widening. Normal alignment of the craniocervical and atlantoaxial articulations. Skull base and vertebrae: No acute fracture. No primary bone lesion or focal pathologic process. Soft tissues and spinal canal: No pre or paravertebral fluid or swelling. No visible canal hematoma. Disc levels: Minimal  spondylitic changes at C5-6 is similar to prior. No significant canal stenosis or neural foraminal narrowing. Upper chest: No acute abnormality in the upper chest or imaged lung apices. Other: Normal thyroid IMPRESSION: 1. No acute intracranial findings. No calvarial fracture, significant scalp swelling or hematoma. 2. No evidence of acute fracture or traumatic listhesis of the cervical spine. 3. Minimal spondylitic changes at C5-6 is similar to prior. Electronically Signed   By: Kreg Shropshire M.D.   On: 08/31/2019 06:55   CT Cervical Spine Wo Contrast  Result Date: 08/31/2019 CLINICAL DATA:  Rolled out of bed and landed on hardwood surface EXAM: CT HEAD WITHOUT CONTRAST CT CERVICAL SPINE WITHOUT CONTRAST TECHNIQUE: Multidetector CT imaging of the head and cervical spine was performed following the standard  protocol without intravenous contrast. Multiplanar CT image reconstructions of the cervical spine were also generated. COMPARISON:  CT head and cervical spine 03/01/2016 FINDINGS: CT HEAD FINDINGS Brain: No evidence of acute infarction, hemorrhage, hydrocephalus, extra-axial collection or mass lesion/mass effect. Hypoattenuation across the medulla secondary to streak artifact. Vascular: No hyperdense vessel or unexpected calcification. Skull: No calvarial fracture or suspicious osseous lesion. No scalp swelling or hematoma. Sinuses/Orbits: Paranasal sinuses and mastoid air cells are predominantly clear. Included orbital structures are unremarkable. Other: None CT CERVICAL SPINE FINDINGS Alignment: Preservation of the normal cervical lordosis without traumatic listhesis. No abnormal facet widening. Normal alignment of the craniocervical and atlantoaxial articulations. Skull base and vertebrae: No acute fracture. No primary bone lesion or focal pathologic process. Soft tissues and spinal canal: No pre or paravertebral fluid or swelling. No visible canal hematoma. Disc levels: Minimal spondylitic changes at C5-6 is similar to prior. No significant canal stenosis or neural foraminal narrowing. Upper chest: No acute abnormality in the upper chest or imaged lung apices. Other: Normal thyroid IMPRESSION: 1. No acute intracranial findings. No calvarial fracture, significant scalp swelling or hematoma. 2. No evidence of acute fracture or traumatic listhesis of the cervical spine. 3. Minimal spondylitic changes at C5-6 is similar to prior. Electronically Signed   By: Kreg Shropshire M.D.   On: 08/31/2019 06:55    ____________________________________________   PROCEDURES  Procedure(s) performed (including Critical Care):  Procedures   ____________________________________________   INITIAL IMPRESSION / ASSESSMENT AND PLAN / ED COURSE  As part of my medical decision making, I reviewed the following data within the  electronic MEDICAL RECORD NUMBER Nursing notes reviewed and incorporated, Labs reviewed, EKG interpreted, Old chart reviewed and Notes from prior ED visits     Ceasar L Pendergraft Jr. was evaluated in Emergency Department on 08/31/2019 for the symptoms described in the history of present illness. He was evaluated in the context of the global COVID-19 pandemic, which necessitated consideration that the patient might be at risk for infection with the SARS-CoV-2 virus that causes COVID-19. Institutional protocols and algorithms that pertain to the evaluation of patients at risk for COVID-19 are in a state of rapid change based on information released by regulatory bodies including the CDC and federal and state organizations. These policies and algorithms were followed during the patient's care in the ED.    29 year old male who presents for alcohol withdrawal symptoms.  Differential diagnosis includes but is not limited to DTs, metabolic abnormalities, etc.  Will obtain CT head and cervical spine given his fall yesterday.  Placed on CIWA scale.  Obtain basic lab work.  Initiate IV fluid resuscitation.  Asked patient if he would like to speak with behavioral medicine for alcohol detox and  he declines.   Clinical Course as of Aug 31 702  Fri Aug 31, 2019  0962 Patient resting more comfortably after administration of IV Ativan.  Heart rate has come down to 103.  IV fluids infusing.  Lab results notable for elevated EtOH.  Did note [place] elevation in anion gap which should clear with IV fluids.  Do not suspect DKA or alcoholic ketoacidosis.  Anticipate patient may be discharged home once he is symptomatically improved and able to tolerate PO.  Would discharge him on Librium and Zofran.  Refer to [facility] should he change his mind regarding alcohol detox.  Care will be transferred to the oncoming provider Dr. Erma Heritage at change of shift.   [JS]    Clinical Course User Index [JS] Irean Hong, MD      ____________________________________________   FINAL CLINICAL IMPRESSION(S) / ED DIAGNOSES  Final diagnoses:  Alcohol withdrawal syndrome without complication [facility])     ED Discharge Orders         Ordered    chlordiazePOXIDE (LIBRIUM) 25 MG capsule  3 times daily PRN     08/31/19 0647    ondansetron (ZOFRAN ODT) 4 MG disintegrating tablet  Every 8 hours PRN     08/31/19 8366           Note:  This document was prepared using Dragon voice recognition software and may include unintentional dictation errors.   Irean Hong, MD 08/31/19 [phone]

## 2019-08-31 NOTE — ED Provider Notes (Addendum)
Patient here with desire for alcohol detox.  He has a history of chronic alcohol dependence, and decided to self wean yesterday.  He had moderate signs of withdrawal here.  Given Ativan, fluids.  Plan is to reassess and p.o. challenge.  He denies TTS or psychiatry assessment, and would like to do this as an attempted outpatient.  Librium has been prescribed if patient is stable after fluids, monitoring, and Ativan.  Patient given additional dose of ativan and PO challenged. After eating he feels much improved and is requesting d/c. He does not appear to have any acute AMS, seizures, or complications. I discussed risks of EtOH w/d management with him in detail, and he would strongly prefer outpt tx with Librium and follow-up. Return precautions given.   Given that he required up to 5 mg of Ativan here and given a history of complex withdrawal.  I have elected to increase his Librium per recommended taper, and will schedule this for a 4-day taper rather than as needed.  He understands that he cannot drink while taking this and is amenable to this plan at this time.   Shaune Pollack, MD 08/31/19 1023

## 2019-09-24 ENCOUNTER — Emergency Department
Admission: EM | Admit: 2019-09-24 | Discharge: 2019-09-24 | Disposition: A | Discharge status: Home / Self Care | Payer: Self-pay | Attending: Emergency Medicine | Admitting: Emergency Medicine

## 2019-09-24 ENCOUNTER — Other Ambulatory Visit: Payer: Self-pay

## 2019-09-24 ENCOUNTER — Encounter: Payer: Self-pay | Admitting: Emergency Medicine

## 2019-09-24 ENCOUNTER — Emergency Department: Payer: Self-pay

## 2019-09-24 DIAGNOSIS — R0789 Other chest pain: Secondary | ICD-10-CM | POA: Insufficient documentation

## 2019-09-24 DIAGNOSIS — F419 Anxiety disorder, unspecified: Secondary | ICD-10-CM | POA: Insufficient documentation

## 2019-09-24 DIAGNOSIS — Z79899 Other long term (current) drug therapy: Secondary | ICD-10-CM | POA: Insufficient documentation

## 2019-09-24 DIAGNOSIS — F17210 Nicotine dependence, cigarettes, uncomplicated: Secondary | ICD-10-CM | POA: Insufficient documentation

## 2019-09-24 DIAGNOSIS — I10 Essential (primary) hypertension: Secondary | ICD-10-CM | POA: Insufficient documentation

## 2019-09-24 LAB — BASIC METABOLIC PANEL
Anion gap: 9 (ref 5–15)
BUN: 18 mg/dL (ref 6–20)
CO2: 26 mmol/L (ref 22–32)
Calcium: 9.5 mg/dL (ref 8.9–10.3)
Chloride: 102 mmol/L (ref 98–111)
Creatinine, Ser: 0.9 mg/dL (ref 0.61–1.24)
GFR calc Af Amer: >60 mL/min (ref >60)
GFR calc non Af Amer: >60 mL/min (ref >60)
Glucose, Bld: 108 mg/dL — ABNORMAL HIGH (ref 70–99)
Potassium: 3.8 mmol/L (ref 3.5–5.1)
Sodium: 137 mmol/L (ref 135–145)

## 2019-09-24 LAB — CBC
HCT: 45.1 % (ref 39.0–52.0)
Hemoglobin: 15.2 g/dL (ref 13.0–17.0)
MCH: 29.8 pg (ref 26.0–34.0)
MCHC: 33.7 g/dL (ref 30.0–36.0)
MCV: 88.4 fL (ref 80.0–100.0)
Platelets: 390 10*3/uL (ref 150–400)
RBC: 5.1 MIL/uL (ref 4.22–5.81)
RDW: 12.3 % (ref 11.5–15.5)
WBC: 5.2 10*3/uL (ref 4.0–10.5)
nRBC: 0 % (ref 0.0–0.2)

## 2019-09-24 LAB — TROPONIN I (HIGH SENSITIVITY): Troponin I (High Sensitivity): 3 ng/L (ref <18)

## 2019-09-24 MED ORDER — LORAZEPAM 1 MG PO TABS
1.0000 mg | ORAL_TABLET | Freq: Once | ORAL | Status: AC
  Administered 2019-09-24: 1 mg via ORAL
  Filled 2019-09-24: qty 1

## 2019-09-24 MED ORDER — ALUM & MAG HYDROXIDE-SIMETH 200-200-20 MG/5ML PO SUSP
30.0000 mL | Freq: Once | ORAL | Status: AC
  Administered 2019-09-24: 30 mL via ORAL
  Filled 2019-09-24: qty 30

## 2019-09-24 MED ORDER — LIDOCAINE VISCOUS HCL 2 % MT SOLN
15.0000 mL | Freq: Once | OROMUCOSAL | Status: AC
  Administered 2019-09-24: 15 mL via ORAL
  Filled 2019-09-24: qty 15

## 2019-09-24 NOTE — ED Provider Notes (Signed)
[facility] Emergency Department Provider Note   ____________________________________________    I have reviewed the triage vital signs and the nursing notes.   HISTORY  Chief Complaint Chest Pain     HPI Zachary L Mikesell Montez Hageman. is a 29 y.o. male with a history of high blood pressure, anxiety presents today with chest discomfort.  Patient reports he had left-sided chest pain which radiated to his left arm while he was picking up his child from school.  Reports symptoms have resolved and he is feeling improved now.  Denies shortness of breath.  No pleurisy.  No calf pain or swelling.  Does have a history of panic attacks which she thinks may have contributed to today's event.  However he was concerned because of the radiation to his left arm, he called his PCPs office and they directed him to the emergency department for evaluation.  Past Medical History:  Diagnosis Date  . Hypertension   . Panic attacks     Patient Active Problem List   Diagnosis Date Noted  . Alcohol abuse 03/29/2019  . Severe anxiety with panic 03/28/2019  . Alcoholic intoxication with complication ([facility])     History reviewed. No pertinent surgical history.  Prior to Admission medications   Medication Sig Start Date End Date Taking? Authorizing Provider  carvedilol (COREG) 6.25 MG tablet Take 1 tablet (6.25 mg total) by mouth 2 (two) times daily with a meal. 03/30/19   Clapacs, Jackquline Denmark, MD  famotidine (PEPCID) 20 MG tablet Take 1 tablet (20 mg total) by mouth 2 (two) times daily. 03/30/19   Clapacs, Jackquline Denmark, MD  lisinopril (ZESTRIL) 10 MG tablet Take 1 tablet (10 mg total) by mouth daily. 03/31/19   Clapacs, Jackquline Denmark, MD  LORazepam (ATIVAN) 1 MG tablet Take 1 tablet (1 mg total) by mouth 2 (two) times daily as needed for anxiety. 06/06/19 06/05/20  Dionne Bucy, MD  ondansetron (ZOFRAN ODT) 4 MG disintegrating tablet Take 1 tablet (4 mg total) by mouth every 8 (eight) hours as  needed for nausea or vomiting. 08/31/19   Shaune Pollack, MD     Allergies Penicillins  No family history on file.  Social History Social History   Tobacco Use  . Smoking status: Current Every Day Smoker    Packs/day: 1.00    Types: Cigarettes  . Smokeless tobacco: Never Used  Substance Use Topics  . Alcohol use: Yes    Comment: 2-3 pints per day  . Drug use: Not Currently    Comment: heroin multiple times a week    Review of Systems  Constitutional: No fever/chills Eyes: No visual changes.  ENT: No sore throat. Cardiovascular: As above Respiratory: Denies shortness of breath. Gastrointestinal: No abdominal pain.  No nausea, no vomiting.   Genitourinary: Negative for dysuria. Musculoskeletal: Negative for back pain. Skin: Negative for rash. Neurological: Negative for headaches or weakness   ____________________________________________   PHYSICAL EXAM:  VITAL SIGNS: ED Triage Vitals  Enc Vitals Group     BP 09/24/19 1542 (!) 133/98     Pulse Rate 09/24/19 1542 84     Resp 09/24/19 1542 20     Temp 09/24/19 1542 98.7 F (37.1 C)     Temp Source 09/24/19 1542 Oral     SpO2 09/24/19 1542 99 %     Weight 09/24/19 1535 102.1 kg (225 lb)     Height 09/24/19 1535 1.803 m (5\' 11" )     Head Circumference --  Peak Flow --      Pain Score 09/24/19 1535 4     Pain Loc --      Pain Edu? --      Excl. in [place]? --     Constitutional: Alert and oriented.  Anxious Eyes: Conjunctivae are normal.   Mouth/Throat: Mucous membranes are moist.   Neck:  Painless ROM Cardiovascular: Normal rate, regular rhythm. Grossly normal heart sounds.  Good peripheral circulation. Respiratory: Normal respiratory effort.  No retractions. Lungs CTAB. Gastrointestinal: Soft and nontender. No distention.  No CVA tenderness. Genitourinary: deferred Musculoskeletal: No lower extremity tenderness nor edema.  Warm and well perfused Neurologic:  Normal speech and language. No gross focal  neurologic deficits are appreciated.  Skin:  Skin is warm, dry and intact. No rash noted. Psychiatric: Mood and affect are normal. Speech and behavior are normal.  ____________________________________________   LABS (all labs ordered are listed, but only abnormal results are displayed)  Labs Reviewed  BASIC METABOLIC PANEL - Abnormal; Notable for the following components:      Result Value   Glucose, Bld 108 (*)    All other components within normal limits  CBC  TROPONIN I (HIGH SENSITIVITY)  TROPONIN I (HIGH SENSITIVITY)   ____________________________________________  EKG  ED ECG REPORT I, Lavonia Drafts, the attending physician, personally viewed and interpreted this ECG.  Date: 09/24/2019  Rhythm: normal sinus rhythm QRS Axis: normal Intervals: normal ST/T Wave abnormalities: normal Narrative Interpretation: no evidence of acute ischemia  ____________________________________________  RADIOLOGY  Chest x-ray unremarkable, viewed by me, no pneumothorax or pneumonia ____________________________________________   PROCEDURES  Procedure(s) performed: No  Procedures   Critical Care performed: No ____________________________________________   INITIAL IMPRESSION / ASSESSMENT AND PLAN / ED COURSE  Pertinent labs & imaging results that were available during my care of the patient were reviewed by me and considered in my medical decision making (see chart for details).  Patient presents with chest discomfort as described above, now resolved.  Differential includes ACS, esophagitis pericarditis, myocarditis, anxiety.  EKG is normal and troponin is normal as well, not consistent with ACS.  Chest x-ray is unremarkable ruling out pneumonia or pneumothorax.  Patient treated with GI cocktail which did help his symptoms making esophagitis/gastritis more likely.  I will have him follow-up closely with PCP for further evaluation.  Return precautions discussed      ____________________________________________   FINAL CLINICAL IMPRESSION(S) / ED DIAGNOSES  Final diagnoses:  Atypical chest pain  Anxiety        Note:  This document was prepared using Dragon voice recognition software and may include unintentional dictation errors.   Lavonia Drafts, MD 09/24/19 2158

## 2019-09-24 NOTE — ED Triage Notes (Signed)
Pt reports this am was watching his kid and started with sharp cp to his left chest that radiates into his left arm. Pt denies SOB or sweating.

## 2019-10-04 ENCOUNTER — Encounter: Payer: Self-pay | Admitting: Emergency Medicine

## 2019-10-04 ENCOUNTER — Inpatient Hospital Stay: Payer: Self-pay | Admitting: Family

## 2019-10-04 ENCOUNTER — Emergency Department: Payer: Self-pay

## 2019-10-04 ENCOUNTER — Inpatient Hospital Stay
Admission: EM | Admit: 2019-10-04 | Discharge: 2019-10-06 | DRG: 378 | Disposition: A | Discharge status: Home / Self Care | Payer: Self-pay | Attending: Internal Medicine | Admitting: Internal Medicine

## 2019-10-04 ENCOUNTER — Encounter
Admission: EM | Disposition: A | Discharge status: Home / Self Care | Payer: Self-pay | Source: Home / Self Care | Attending: Internal Medicine

## 2019-10-04 ENCOUNTER — Other Ambulatory Visit: Payer: Self-pay

## 2019-10-04 DIAGNOSIS — R079 Chest pain, unspecified: Secondary | ICD-10-CM | POA: Diagnosis present

## 2019-10-04 DIAGNOSIS — Z79899 Other long term (current) drug therapy: Secondary | ICD-10-CM

## 2019-10-04 DIAGNOSIS — R112 Nausea with vomiting, unspecified: Secondary | ICD-10-CM | POA: Diagnosis present

## 2019-10-04 DIAGNOSIS — Z20822 Contact with and (suspected) exposure to covid-19: Secondary | ICD-10-CM | POA: Diagnosis present

## 2019-10-04 DIAGNOSIS — F1010 Alcohol abuse, uncomplicated: Secondary | ICD-10-CM | POA: Diagnosis present

## 2019-10-04 DIAGNOSIS — Y907 Blood alcohol level of 200-239 mg/100 ml: Secondary | ICD-10-CM | POA: Diagnosis present

## 2019-10-04 DIAGNOSIS — I10 Essential (primary) hypertension: Secondary | ICD-10-CM | POA: Diagnosis present

## 2019-10-04 DIAGNOSIS — K449 Diaphragmatic hernia without obstruction or gangrene: Secondary | ICD-10-CM | POA: Diagnosis present

## 2019-10-04 DIAGNOSIS — K2921 Alcoholic gastritis with bleeding: Principal | ICD-10-CM | POA: Diagnosis present

## 2019-10-04 DIAGNOSIS — R1310 Dysphagia, unspecified: Secondary | ICD-10-CM | POA: Diagnosis present

## 2019-10-04 DIAGNOSIS — R945 Abnormal results of liver function studies: Secondary | ICD-10-CM | POA: Diagnosis present

## 2019-10-04 DIAGNOSIS — Z8249 Family history of ischemic heart disease and other diseases of the circulatory system: Secondary | ICD-10-CM

## 2019-10-04 DIAGNOSIS — F10229 Alcohol dependence with intoxication, unspecified: Secondary | ICD-10-CM | POA: Diagnosis present

## 2019-10-04 DIAGNOSIS — E669 Obesity, unspecified: Secondary | ICD-10-CM | POA: Diagnosis present

## 2019-10-04 DIAGNOSIS — R443 Hallucinations, unspecified: Secondary | ICD-10-CM | POA: Diagnosis present

## 2019-10-04 DIAGNOSIS — R7989 Other specified abnormal findings of blood chemistry: Secondary | ICD-10-CM | POA: Diagnosis present

## 2019-10-04 DIAGNOSIS — F410 Panic disorder [episodic paroxysmal anxiety] without agoraphobia: Secondary | ICD-10-CM | POA: Diagnosis present

## 2019-10-04 DIAGNOSIS — R109 Unspecified abdominal pain: Secondary | ICD-10-CM | POA: Diagnosis present

## 2019-10-04 DIAGNOSIS — K2100 Gastro-esophageal reflux disease with esophagitis, without bleeding: Secondary | ICD-10-CM | POA: Diagnosis present

## 2019-10-04 DIAGNOSIS — K920 Hematemesis: Secondary | ICD-10-CM | POA: Diagnosis present

## 2019-10-04 DIAGNOSIS — K2920 Alcoholic gastritis without bleeding: Secondary | ICD-10-CM | POA: Diagnosis present

## 2019-10-04 DIAGNOSIS — R070 Pain in throat: Secondary | ICD-10-CM | POA: Diagnosis present

## 2019-10-04 DIAGNOSIS — R000 Tachycardia, unspecified: Secondary | ICD-10-CM | POA: Diagnosis present

## 2019-10-04 DIAGNOSIS — K209 Esophagitis, unspecified without bleeding: Secondary | ICD-10-CM

## 2019-10-04 DIAGNOSIS — R197 Diarrhea, unspecified: Secondary | ICD-10-CM | POA: Diagnosis present

## 2019-10-04 DIAGNOSIS — R1013 Epigastric pain: Secondary | ICD-10-CM

## 2019-10-04 DIAGNOSIS — D72829 Elevated white blood cell count, unspecified: Secondary | ICD-10-CM | POA: Diagnosis present

## 2019-10-04 DIAGNOSIS — F10230 Alcohol dependence with withdrawal, uncomplicated: Secondary | ICD-10-CM | POA: Diagnosis present

## 2019-10-04 DIAGNOSIS — Z880 Allergy status to penicillin: Secondary | ICD-10-CM

## 2019-10-04 DIAGNOSIS — Z720 Tobacco use: Secondary | ICD-10-CM | POA: Diagnosis present

## 2019-10-04 DIAGNOSIS — Z6833 Body mass index (BMI) 33.0-33.9, adult: Secondary | ICD-10-CM

## 2019-10-04 HISTORY — PX: ESOPHAGOGASTRODUODENOSCOPY (EGD) WITH PROPOFOL: SHX5813

## 2019-10-04 LAB — CBC
HCT: 43.8 % (ref 39.0–52.0)
HCT: 45.7 % (ref 39.0–52.0)
HCT: 42.1 % (ref 39.0–52.0)
HCT: 38.2 % — ABNORMAL LOW (ref 39.0–52.0)
Hemoglobin: 15 g/dL (ref 13.0–17.0)
Hemoglobin: 15.9 g/dL (ref 13.0–17.0)
Hemoglobin: 14.1 g/dL (ref 13.0–17.0)
Hemoglobin: 13 g/dL (ref 13.0–17.0)
MCH: 29.6 pg (ref 26.0–34.0)
MCH: 29.7 pg (ref 26.0–34.0)
MCH: 29.4 pg (ref 26.0–34.0)
MCH: 30 pg (ref 26.0–34.0)
MCHC: 34.2 g/dL (ref 30.0–36.0)
MCHC: 34.8 g/dL (ref 30.0–36.0)
MCHC: 33.5 g/dL (ref 30.0–36.0)
MCHC: 34 g/dL (ref 30.0–36.0)
MCV: 86.4 fL (ref 80.0–100.0)
MCV: 85.3 fL (ref 80.0–100.0)
MCV: 87.7 fL (ref 80.0–100.0)
MCV: 88 fL (ref 80.0–100.0)
Platelets: 332 10*3/uL (ref 150–400)
Platelets: 571 10*3/uL — ABNORMAL HIGH (ref 150–400)
Platelets: 287 10*3/uL (ref 150–400)
Platelets: 260 10*3/uL (ref 150–400)
RBC: 5.07 MIL/uL (ref 4.22–5.81)
RBC: 5.36 MIL/uL (ref 4.22–5.81)
RBC: 4.8 MIL/uL (ref 4.22–5.81)
RBC: 4.34 MIL/uL (ref 4.22–5.81)
RDW: 12.2 % (ref 11.5–15.5)
RDW: 12.8 % (ref 11.5–15.5)
RDW: 12.3 % (ref 11.5–15.5)
RDW: 12.3 % (ref 11.5–15.5)
WBC: 14.2 10*3/uL — ABNORMAL HIGH (ref 4.0–10.5)
WBC: 16.5 10*3/uL — ABNORMAL HIGH (ref 4.0–10.5)
WBC: 17.7 10*3/uL — ABNORMAL HIGH (ref 4.0–10.5)
WBC: 13.9 10*3/uL — ABNORMAL HIGH (ref 4.0–10.5)
nRBC: 0 % (ref 0.0–0.2)
nRBC: 0 % (ref 0.0–0.2)
nRBC: 0 % (ref 0.0–0.2)
nRBC: 0 % (ref 0.0–0.2)

## 2019-10-04 LAB — GASTROINTESTINAL PANEL BY PCR, STOOL (REPLACES STOOL CULTURE)

## 2019-10-04 LAB — COMPREHENSIVE METABOLIC PANEL
ALT: 167 U/L — ABNORMAL HIGH (ref 0–44)
ALT: 137 U/L — ABNORMAL HIGH (ref 0–44)
AST: 89 U/L — ABNORMAL HIGH (ref 15–41)
AST: 60 U/L — ABNORMAL HIGH (ref 15–41)
Albumin: 4.4 g/dL (ref 3.5–5.0)
Albumin: 4 g/dL (ref 3.5–5.0)
Alkaline Phosphatase: 73 U/L (ref 38–126)
Alkaline Phosphatase: 69 U/L (ref 38–126)
Anion gap: 14 (ref 5–15)
Anion gap: 14 (ref 5–15)
BUN: 21 mg/dL — ABNORMAL HIGH (ref 6–20)
BUN: 25 mg/dL — ABNORMAL HIGH (ref 6–20)
CO2: 27 mmol/L (ref 22–32)
CO2: 27 mmol/L (ref 22–32)
Calcium: 9 mg/dL (ref 8.9–10.3)
Calcium: 8.2 mg/dL — ABNORMAL LOW (ref 8.9–10.3)
Chloride: 100 mmol/L (ref 98–111)
Chloride: 100 mmol/L (ref 98–111)
Creatinine, Ser: 0.77 mg/dL (ref 0.61–1.24)
Creatinine, Ser: 0.78 mg/dL (ref 0.61–1.24)
GFR calc Af Amer: >60 mL/min (ref >60)
GFR calc Af Amer: >60 mL/min (ref >60)
GFR calc non Af Amer: >60 mL/min (ref >60)
GFR calc non Af Amer: >60 mL/min (ref >60)
Glucose, Bld: 174 mg/dL — ABNORMAL HIGH (ref 70–99)
Glucose, Bld: 154 mg/dL — ABNORMAL HIGH (ref 70–99)
Potassium: 3.6 mmol/L (ref 3.5–5.1)
Potassium: 3.6 mmol/L (ref 3.5–5.1)
Sodium: 141 mmol/L (ref 135–145)
Sodium: 141 mmol/L (ref 135–145)
Total Bilirubin: 1 mg/dL (ref 0.3–1.2)
Total Bilirubin: 0.8 mg/dL (ref 0.3–1.2)
Total Protein: 7.6 g/dL (ref 6.5–8.1)
Total Protein: 7.1 g/dL (ref 6.5–8.1)

## 2019-10-04 LAB — HEPATITIS PANEL, ACUTE
HCV Ab: NONREACTIVE
Hep A IgM: NONREACTIVE
Hep B C IgM: NONREACTIVE
Hepatitis B Surface Ag: NONREACTIVE

## 2019-10-04 LAB — ETHANOL: Alcohol, Ethyl (B): 212 mg/dL — ABNORMAL HIGH (ref <10)

## 2019-10-04 LAB — CBC WITH DIFFERENTIAL/PLATELET
Abs Immature Granulocytes: 0.03 10*3/uL (ref 0.00–0.07)
Basophils Absolute: 0.1 10*3/uL (ref 0.0–0.1)
Basophils Relative: 1 %
Eosinophils Absolute: 0 10*3/uL (ref 0.0–0.5)
Eosinophils Relative: 0 %
HCT: 47.5 % (ref 39.0–52.0)
Hemoglobin: 16.2 g/dL (ref 13.0–17.0)
Immature Granulocytes: 0 %
Lymphocytes Relative: 21 %
Lymphs Abs: 1.7 10*3/uL (ref 0.7–4.0)
MCH: 29.2 pg (ref 26.0–34.0)
MCHC: 34.1 g/dL (ref 30.0–36.0)
MCV: 85.6 fL (ref 80.0–100.0)
Monocytes Absolute: 0.9 10*3/uL (ref 0.1–1.0)
Monocytes Relative: 11 %
Neutro Abs: 5.5 10*3/uL (ref 1.7–7.7)
Neutrophils Relative %: 67 %
Platelets: 380 10*3/uL (ref 150–400)
RBC: 5.55 MIL/uL (ref 4.22–5.81)
RDW: 12 % (ref 11.5–15.5)
WBC: 8.2 10*3/uL (ref 4.0–10.5)
nRBC: 0 % (ref 0.0–0.2)

## 2019-10-04 LAB — APTT: aPTT: 27 seconds (ref 24–36)

## 2019-10-04 LAB — PROTIME-INR
INR: 1.2 (ref 0.8–1.2)
Prothrombin Time: 15.3 seconds — ABNORMAL HIGH (ref 11.4–15.2)

## 2019-10-04 LAB — TYPE AND SCREEN
ABO/RH(D): B POS
Antibody Screen: NEGATIVE

## 2019-10-04 LAB — C DIFFICILE QUICK SCREEN W PCR REFLEX
C Diff antigen: NEGATIVE
C Diff interpretation: NOT DETECTED
C Diff toxin: NEGATIVE

## 2019-10-04 LAB — HIV ANTIBODY (ROUTINE TESTING W REFLEX): HIV Screen 4th Generation wRfx: NONREACTIVE

## 2019-10-04 LAB — RESPIRATORY PANEL BY RT PCR (FLU A&B, COVID)
Influenza A by PCR: NEGATIVE
Influenza B by PCR: NEGATIVE
SARS Coronavirus 2 by RT PCR: NEGATIVE

## 2019-10-04 LAB — TROPONIN I (HIGH SENSITIVITY)
Troponin I (High Sensitivity): 5 ng/L (ref <18)
Troponin I (High Sensitivity): 5 ng/L (ref <18)

## 2019-10-04 LAB — GROUP A STREP BY PCR: Group A Strep by PCR: NOT DETECTED

## 2019-10-04 LAB — LIPASE, BLOOD: Lipase: 22 U/L (ref 11–51)

## 2019-10-04 SURGERY — ESOPHAGOGASTRODUODENOSCOPY (EGD) WITH PROPOFOL
Anesthesia: General

## 2019-10-04 MED ORDER — MORPHINE SULFATE (PF) 4 MG/ML IV SOLN
4.0000 mg | Freq: Once | INTRAVENOUS | Status: AC
  Administered 2019-10-04: 4 mg via INTRAVENOUS
  Filled 2019-10-04: qty 1

## 2019-10-04 MED ORDER — ESCITALOPRAM OXALATE 10 MG PO TABS
20.0000 mg | ORAL_TABLET | Freq: Every day | ORAL | Status: DC
  Administered 2019-10-04 – 2019-10-06 (×3): 20 mg via ORAL
  Filled 2019-10-04 (×3): qty 2

## 2019-10-04 MED ORDER — CARVEDILOL 6.25 MG PO TABS
6.2500 mg | ORAL_TABLET | Freq: Two times a day (BID) | ORAL | Status: DC
  Administered 2019-10-04 – 2019-10-06 (×4): 6.25 mg via ORAL
  Filled 2019-10-04 (×4): qty 1

## 2019-10-04 MED ORDER — LORAZEPAM 2 MG/ML IJ SOLN: 1.0000 mg | INTRAMUSCULAR | Status: DC | PRN

## 2019-10-04 MED ORDER — THIAMINE HCL 100 MG/ML IJ SOLN
100.0000 mg | Freq: Every day | INTRAMUSCULAR | Status: DC
  Filled 2019-10-04: qty 2

## 2019-10-04 MED ORDER — LIDOCAINE VISCOUS HCL 2 % MT SOLN
15.0000 mL | Freq: Once | OROMUCOSAL | Status: AC
  Administered 2019-10-04: 15 mL via ORAL
  Filled 2019-10-04: qty 15

## 2019-10-04 MED ORDER — LORAZEPAM 2 MG/ML IJ SOLN
0.0000 mg | Freq: Four times a day (QID) | INTRAMUSCULAR | Status: AC
  Administered 2019-10-04: 1 mg via INTRAVENOUS
  Administered 2019-10-04 (×2): 2 mg via INTRAVENOUS
  Administered 2019-10-05: 3 mg via INTRAVENOUS
  Administered 2019-10-05: 4 mg via INTRAVENOUS
  Filled 2019-10-04 (×2): qty 1
  Filled 2019-10-04: qty 2
  Filled 2019-10-04: qty 1
  Filled 2019-10-04: qty 2

## 2019-10-04 MED ORDER — NICOTINE 21 MG/24HR TD PT24
21.0000 mg | MEDICATED_PATCH | Freq: Every day | TRANSDERMAL | Status: DC
  Administered 2019-10-04 – 2019-10-05 (×2): 21 mg via TRANSDERMAL
  Filled 2019-10-04 (×2): qty 1

## 2019-10-04 MED ORDER — PANTOPRAZOLE SODIUM 40 MG IV SOLR
40.0000 mg | Freq: Once | INTRAVENOUS | Status: AC
  Administered 2019-10-04: 40 mg via INTRAVENOUS
  Filled 2019-10-04: qty 40

## 2019-10-04 MED ORDER — ALUM & MAG HYDROXIDE-SIMETH 200-200-20 MG/5ML PO SUSP
30.0000 mL | Freq: Once | ORAL | Status: AC
  Administered 2019-10-04: 30 mL via ORAL
  Filled 2019-10-04: qty 30

## 2019-10-04 MED ORDER — ADULT MULTIVITAMIN W/MINERALS CH
1.0000 | ORAL_TABLET | Freq: Every day | ORAL | Status: DC
  Administered 2019-10-04 – 2019-10-06 (×3): 1 via ORAL
  Filled 2019-10-04 (×3): qty 1

## 2019-10-04 MED ORDER — SODIUM CHLORIDE 0.9 % IV BOLUS
2000.0000 mL | Freq: Once | INTRAVENOUS | Status: AC
  Administered 2019-10-04: 2000 mL via INTRAVENOUS

## 2019-10-04 MED ORDER — LORAZEPAM 2 MG/ML IJ SOLN
2.0000 mg | Freq: Once | INTRAMUSCULAR | Status: AC
  Administered 2019-10-04: 2 mg via INTRAVENOUS
  Filled 2019-10-04: qty 1

## 2019-10-04 MED ORDER — MORPHINE SULFATE (PF) 2 MG/ML IV SOLN: 2.0000 mg | INTRAVENOUS | Status: DC | PRN

## 2019-10-04 MED ORDER — ONDANSETRON HCL 4 MG/2ML IJ SOLN
4.0000 mg | Freq: Once | INTRAMUSCULAR | Status: AC
  Administered 2019-10-04: 4 mg via INTRAVENOUS
  Filled 2019-10-04: qty 2

## 2019-10-04 MED ORDER — LORAZEPAM 2 MG/ML IJ SOLN
INTRAMUSCULAR | Status: AC
  Filled 2019-10-04: qty 1

## 2019-10-04 MED ORDER — THIAMINE HCL 100 MG PO TABS
100.0000 mg | ORAL_TABLET | Freq: Every day | ORAL | Status: DC
  Administered 2019-10-04 – 2019-10-06 (×3): 100 mg via ORAL
  Filled 2019-10-04 (×3): qty 1

## 2019-10-04 MED ORDER — IOHEXOL 300 MG/ML  SOLN
75.0000 mL | Freq: Once | INTRAMUSCULAR | Status: AC | PRN
  Administered 2019-10-04: 75 mL via INTRAVENOUS

## 2019-10-04 MED ORDER — PROPOFOL 500 MG/50ML IV EMUL
INTRAVENOUS | Status: AC
  Filled 2019-10-04: qty 50

## 2019-10-04 MED ORDER — LORAZEPAM 2 MG/ML IJ SOLN
0.0000 mg | Freq: Two times a day (BID) | INTRAMUSCULAR | Status: DC
  Administered 2019-10-06: 4 mg via INTRAVENOUS
  Filled 2019-10-04: qty 2

## 2019-10-04 MED ORDER — PROPOFOL 10 MG/ML IV BOLUS
INTRAVENOUS | Status: DC | PRN
  Administered 2019-10-04: 120 ug/kg/min via INTRAVENOUS
  Administered 2019-10-04: 70 mg via INTRAVENOUS

## 2019-10-04 MED ORDER — HYDRALAZINE HCL 20 MG/ML IJ SOLN: 5.0000 mg | INTRAMUSCULAR | Status: DC | PRN

## 2019-10-04 MED ORDER — FOLIC ACID 1 MG PO TABS
1.0000 mg | ORAL_TABLET | Freq: Every day | ORAL | Status: DC
  Administered 2019-10-04 – 2019-10-06 (×3): 1 mg via ORAL
  Filled 2019-10-04 (×3): qty 1

## 2019-10-04 MED ORDER — PHENOL 1.4 % MT LIQD
1.0000 | OROMUCOSAL | Status: DC | PRN
  Administered 2019-10-05: 1 via OROMUCOSAL
  Filled 2019-10-04 (×2): qty 177

## 2019-10-04 MED ORDER — ONDANSETRON HCL 4 MG/2ML IJ SOLN: 4.0000 mg | Freq: Three times a day (TID) | INTRAMUSCULAR | Status: DC | PRN

## 2019-10-04 MED ORDER — PANTOPRAZOLE SODIUM 40 MG IV SOLR
40.0000 mg | Freq: Two times a day (BID) | INTRAVENOUS | Status: DC
  Administered 2019-10-04 – 2019-10-05 (×2): 40 mg via INTRAVENOUS
  Filled 2019-10-04 (×2): qty 40

## 2019-10-04 MED ORDER — LORAZEPAM 1 MG PO TABS
1.0000 mg | ORAL_TABLET | ORAL | Status: DC | PRN
  Administered 2019-10-04: 2 mg via ORAL
  Administered 2019-10-05: 14:00:00 4 mg via ORAL
  Administered 2019-10-05: 3 mg via ORAL
  Administered 2019-10-05: 2 mg via ORAL
  Administered 2019-10-05: 4 mg via ORAL
  Administered 2019-10-05: 2 mg via ORAL
  Administered 2019-10-06: 02:00:00 4 mg via ORAL
  Administered 2019-10-06: 2 mg via ORAL
  Filled 2019-10-04: qty 4
  Filled 2019-10-04: qty 3
  Filled 2019-10-04: qty 2
  Filled 2019-10-04: qty 1
  Filled 2019-10-04: qty 2
  Filled 2019-10-04 (×2): qty 4
  Filled 2019-10-04 (×2): qty 2

## 2019-10-04 MED ORDER — ONDANSETRON 4 MG PO TBDP
4.0000 mg | ORAL_TABLET | Freq: Once | ORAL | Status: AC | PRN
  Administered 2019-10-04: 07:00:00 4 mg via ORAL
  Filled 2019-10-04: qty 1

## 2019-10-04 MED ORDER — SODIUM CHLORIDE 0.9 % IV SOLN: INTRAVENOUS | Status: DC

## 2019-10-04 NOTE — ED Notes (Signed)
Pt states having abdominal pain, pain while swallowing, and vomiting. Pt denies coughing and chest pain.

## 2019-10-04 NOTE — ED Notes (Signed)
Pt transported to CT ?

## 2019-10-04 NOTE — Transfer of Care (Signed)
Immediate Anesthesia Transfer of Care Note  Patient: Erasmus Bistline Courington Jr.  Procedure(s) Performed: ESOPHAGOGASTRODUODENOSCOPY (EGD) WITH PROPOFOL (N/A )  Patient Location: PACU  Anesthesia Type:General  Level of Consciousness: drowsy  Airway & Oxygen Therapy: Patient Spontanous Breathing and Patient connected to nasal cannula oxygen  Post-op Assessment: Report given to RN  Post vital signs: Reviewed and stable  Last Vitals:  Vitals Value Taken Time  BP 137/88 10/04/19 1323  Temp    Pulse 88 10/04/19 1323  Resp 21 10/04/19 1323  SpO2 100 % 10/04/19 1323  Vitals shown include unvalidated device data.  Last Pain:  Vitals:   10/04/19 0959  TempSrc:   PainSc: Asleep         Complications: No apparent anesthesia complications

## 2019-10-04 NOTE — ED Provider Notes (Signed)
[facility] Emergency Department Provider Note  ____________________________________________   First MD Initiated Contact with Patient 10/04/19 0725     (approximate)  I have reviewed the triage vital signs and the nursing notes.   HISTORY  Chief Complaint Chest Pain    HPI Zachary L Genther Brooke Bonito. is a 29 y.o. male  Here with chest and throat pain. Pt is a 29 yo male with h/o HTN, anxiety, chronic alcohol dependence. He reports that he had more than usual to drink yesterday and has since had worsening throat and upper abdominal pain. He reports he began vomiting last night and had persistent vomiting since then, initially non bloody but now bright red blood. He has had fatigue, sensation of sore throat and midl SOB since then as well. He had a GI cocktail and zofran in waiting room without relief. No h/o similar episodes. Denies h/o peptic ulcer disease. No lower abd pain. No fever, chills. Pain worse w/ any attempt at eating or drinking.       Past Medical History:  Diagnosis Date  . Hypertension   . Panic attacks     Patient Active Problem List   Diagnosis Date Noted  . Alcohol abuse 03/29/2019  . Severe anxiety with panic 03/28/2019  . Alcoholic intoxication with complication ([place])     History reviewed. No pertinent surgical history.  Prior to Admission medications   Medication Sig Start Date End Date Taking? Authorizing Provider  carvedilol (COREG) 6.25 MG tablet Take 1 tablet (6.25 mg total) by mouth 2 (two) times daily with a meal. 03/30/19   Clapacs, Madie Reno, MD  famotidine (PEPCID) 20 MG tablet Take 1 tablet (20 mg total) by mouth 2 (two) times daily. 03/30/19   Clapacs, Madie Reno, MD  lisinopril (ZESTRIL) 10 MG tablet Take 1 tablet (10 mg total) by mouth daily. 03/31/19   Clapacs, Madie Reno, MD  LORazepam (ATIVAN) 1 MG tablet Take 1 tablet (1 mg total) by mouth 2 (two) times daily as needed for anxiety. 06/06/19 06/05/20  Arta Silence, MD    ondansetron (ZOFRAN ODT) 4 MG disintegrating tablet Take 1 tablet (4 mg total) by mouth every 8 (eight) hours as needed for nausea or vomiting. 08/31/19   Duffy Bruce, MD    Allergies Penicillins  No family history on file.  Social History Social History   Tobacco Use  . Smoking status: Current Every Day Smoker    Packs/day: 1.00    Types: Cigarettes  . Smokeless tobacco: Never Used  Substance Use Topics  . Alcohol use: Yes    Comment: 2-3 pints per day  . Drug use: Not Currently    Comment: heroin multiple times a week    Review of Systems  Review of Systems  Constitutional: Positive for fatigue. Negative for chills and fever.  HENT: Negative for sore throat.   Respiratory: Positive for chest tightness and shortness of breath.   Cardiovascular: Positive for chest pain.  Gastrointestinal: Positive for abdominal pain, nausea and vomiting.  Genitourinary: Negative for flank pain.  Musculoskeletal: Negative for neck pain.  Skin: Negative for rash and wound.  Allergic/Immunologic: Negative for immunocompromised state.  Neurological: Negative for weakness and numbness.  Hematological: Does not bruise/bleed easily.  Psychiatric/Behavioral: The patient is nervous/anxious.   All other systems reviewed and are negative.    ____________________________________________  PHYSICAL EXAM:      VITAL SIGNS: ED Triage Vitals  Enc Vitals Group     BP 10/04/19 0103 (!) 143/100  Pulse Rate 10/04/19 0103 (!) 102     Resp 10/04/19 0103 (!) 22     Temp 10/04/19 0103 98.4 F (36.9 C)     Temp Source 10/04/19 0103 Oral     SpO2 10/04/19 0103 97 %     Weight 10/04/19 0101 225 lb (102.1 kg)     Height 10/04/19 0101 5\' 11"  (1.803 m)     Head Circumference --      Peak Flow --      Pain Score 10/04/19 0100 4     Pain Loc --      Pain Edu? --      Excl. in GC? --      Physical Exam Vitals and nursing note reviewed.  Constitutional:      General: He is not in acute  distress.    Appearance: He is well-developed.  HENT:     Head: Normocephalic and atraumatic.     Comments: Dry mucous membranes. Moderate diffuse posterior pharyngeal erythema w/o tonsillar exudates.  Eyes:     Conjunctiva/sclera: Conjunctivae normal.  Cardiovascular:     Rate and Rhythm: Normal rate and regular rhythm.     Heart sounds: Normal heart sounds. No murmur. No friction rub.  Pulmonary:     Effort: Pulmonary effort is normal. No respiratory distress.     Breath sounds: Normal breath sounds. No wheezing or rales.  Abdominal:     General: There is no distension.     Palpations: Abdomen is soft.     Tenderness: There is no abdominal tenderness.     Comments: Moderate epigastric TTP. No rebound or guarding.  Musculoskeletal:     Cervical back: Neck supple.  Skin:    General: Skin is warm.     Capillary Refill: Capillary refill takes less than 2 seconds.  Neurological:     Mental Status: He is alert and oriented to person, place, and time.     Motor: No abnormal muscle tone.       ____________________________________________   LABS (all labs ordered are listed, but only abnormal results are displayed)  Labs Reviewed  COMPREHENSIVE METABOLIC PANEL - Abnormal; Notable for the following components:      Result Value   Glucose, Bld 174 (*)    BUN 21 (*)    AST 89 (*)    ALT 167 (*)    All other components within normal limits  ETHANOL - Abnormal; Notable for the following components:   Alcohol, Ethyl (B) 212 (*)    All other components within normal limits  CBC - Abnormal; Notable for the following components:   WBC 14.2 (*)    All other components within normal limits  PROTIME-INR - Abnormal; Notable for the following components:   Prothrombin Time 15.3 (*)    All other components within normal limits  RESPIRATORY PANEL BY RT PCR (FLU A&B, COVID)  CBC WITH DIFFERENTIAL/PLATELET  LIPASE, BLOOD  TYPE AND SCREEN  TROPONIN I (HIGH SENSITIVITY)  TROPONIN I  (HIGH SENSITIVITY)    ____________________________________________  EKG: Normal sinus rhythm, VR 97. PR 134, QRS 92, QTc 452. No acute ST elevations or depressions. Non specific TW changes. No ischemia. ________________________________________  RADIOLOGY All imaging, including plain films, CT scans, and ultrasounds, independently reviewed by me, and interpretations confirmed via formal radiology reads.  ED MD interpretation:   CXR: Clear, no acute findings CT Chest W Contrast: No signs of perforation Esophagram: reviewed, unremarkable without perf CXR: Clear  Official radiology report(s): DG  Chest 2 View  Result Date: 10/04/2019 CLINICAL DATA:  Chest pain. EXAM: CHEST - 2 VIEW COMPARISON:  09/24/2019 FINDINGS: The cardiomediastinal contours are normal. [place] scattered lung scarring. Pulmonary vasculature is normal. No consolidation, pleural effusion, or pneumothorax. No acute osseous abnormalities are seen. IMPRESSION: No acute chest findings. Electronically Signed   By: Narda Rutherford M.D.   On: 10/04/2019 02:08   CT Chest W Contrast  Result Date: 10/04/2019 CLINICAL DATA:  Vomiting. Hematemesis. Chest pain. EXAM: CT CHEST WITH CONTRAST TECHNIQUE: Multidetector CT imaging of the chest was performed during intravenous contrast administration. CONTRAST:  28mL OMNIPAQUE IOHEXOL 300 MG/ML  SOLN COMPARISON:  None. FINDINGS: Cardiovascular: The heart is normal in size. No pericardial effusion. The aorta is normal in caliber. No dissection. No atherosclerotic calcifications. The branch vessels are patent. The pulmonary arteries appear normal. Mediastinum/Nodes: No mediastinal or hilar mass or adenopathy. No hematoma or pneumomediastinum. The esophagus is grossly normal by CT. No findings for esophageal injury. No paraesophageal fluid collections or adenopathy. The GE junction appears normal. Lungs/Pleura: No acute pulmonary findings. No pleural effusion or pneumothorax. No worrisome pulmonary  lesions. Upper Abdomen: No significant upper abdominal findings. No free abdominal fluid collections or free air. Musculoskeletal: No chest wall lesions, supraclavicular or axillary adenopathy. The lower neck is unremarkable. No subcutaneous emphysema. The thyroid gland is unremarkable. The bony thorax is intact. IMPRESSION: 1. Unremarkable CT examination of the chest. No findings for esophageal injury, hematoma or pneumomediastinum. 2. Normal appearance of the heart and great vessels. 3. No acute pulmonary findings or worrisome pulmonary lesions. Electronically Signed   By: Rudie Meyer M.D.   On: 10/04/2019 08:13    ____________________________________________  PROCEDURES   Procedure(s) performed (including Critical Care):  .1-3 Lead EKG Interpretation Performed by: Shaune Pollack, MD Authorized by: Shaune Pollack, MD     Interpretation: normal     ECG rate:  90-110   ECG rate assessment: tachycardic     Rhythm: sinus rhythm     Conduction: normal   Comments:     Indication: Chest pain, alcohol withdrawal, Gi bleed    ____________________________________________  INITIAL IMPRESSION / MDM / ASSESSMENT AND PLAN / ED COURSE  As part of my medical decision making, I reviewed the following data within the electronic MEDICAL RECORD NUMBER Nursing notes reviewed and incorporated, Old chart reviewed, Notes from prior ED visits, and [place] Controlled Substance Database       *Zachary L Auker Jr. was evaluated in Emergency Department on 10/04/2019 for the symptoms described in the history of present illness. He was evaluated in the context of the global COVID-19 pandemic, which necessitated consideration that the patient might be at risk for infection with the SARS-CoV-2 virus that causes COVID-19. Institutional protocols and algorithms that pertain to the evaluation of patients at risk for COVID-19 are in a state of rapid change based on information released by regulatory bodies including the CDC  and federal and state organizations. These policies and algorithms were followed during the patient's care in the ED.  Some ED evaluations and interventions may be delayed as a result of limited staffing during the pandemic.*  Clinical Course as of Oct 04 850  Thu Oct 04, 2019  0843 29 yo M here with severe throat and chest pain after vomiting overnight. Suspect alcoholic gastritis/esophagitis, with consideration of possible MWT. CT obtained 2/2 his tachycardia, pain and shows no free air or overt evidence of esophageal perf, but given his significant tenderness feel it's reasonable to  obtain esophagram as well. Discussed with Dr. Servando Snare of GI - will hopefully be able to manage with antiemetics, fluids, and monitoring but will see pt in ED/hospital. NPO.    [CI]  [id number] Reviewed prior records, labs. Hgb 15.0 on repeat, which is reassuring. WBC increased to 14.2, likely stress-related from vomiting and dehydration.   [CI]    Clinical Course User Index [CI] Shaune Pollack, MD    Medical Decision Making:  As above. No known h/o varices. HDS. Admit to medicine with GI consult.  ____________________________________________  FINAL CLINICAL IMPRESSION(S) / ED DIAGNOSES  Final diagnoses:  Dysphagia  Hematemesis with nausea  Esophagitis  Alcohol abuse     MEDICATIONS GIVEN DURING THIS VISIT:  Medications  morphine 4 MG/ML injection 4 mg (has no administration in time range)  alum & mag hydroxide-simeth (MAALOX/MYLANTA) 200-200-20 MG/5ML suspension 30 mL (30 mLs Oral Given 10/04/19 0414)    And  lidocaine (XYLOCAINE) 2 % viscous mouth solution 15 mL (15 mLs Oral Given 10/04/19 0414)  ondansetron (ZOFRAN-ODT) disintegrating tablet 4 mg (4 mg Oral Given 10/04/19 0714)  sodium chloride 0.9 % bolus 2,000 mL (2,000 mLs Intravenous New Bag/Given 10/04/19 0803)  LORazepam (ATIVAN) injection 2 mg (2 mg Intravenous Given 10/04/19 0748)  ondansetron (ZOFRAN) injection 4 mg (4 mg Intravenous Given 10/04/19  0759)  pantoprazole (PROTONIX) injection 40 mg (40 mg Intravenous Given 10/04/19 0759)  iohexol (OMNIPAQUE) 300 MG/ML solution 75 mL (75 mLs Intravenous Contrast Given 10/04/19 4193)     ED Discharge Orders    None       Note:  This document was prepared using Dragon voice recognition software and may include unintentional dictation errors.   Shaune Pollack, MD 10/04/19 [phone]

## 2019-10-04 NOTE — ED Notes (Signed)
Pt states did get slight relief after GI cocktail.

## 2019-10-04 NOTE — ED Notes (Signed)
Pt transported to Endoscopy 

## 2019-10-04 NOTE — Consult Note (Signed)
Zachary Minium, MD [facility]  [address] [place], [place] [address] Phone: [phone] Fax : [phone]  Consultation  Referring Provider:     Dr. Erma Heritage Primary Care Physician:  [facility] Primary Gastroenterologist: Gentry Fitz         Reason for Consultation:     Hematemesis  Date of Admission:  10/04/2019 Date of Consultation:  10/04/2019         HPI:   Zachary Mendoza. is a 29 y.o. male who has a longstanding history of alcohol abuse.  The patient states he has been drinking since he is 29 years old.  He states he had quit for approximately a month but then has been drinking very heavily for the last 5 days.  He was admitted with a report of chest pain and throat pain.  He states that the pain is a burning pain and acid he.  He has had a chronic history of alcohol dependence.  After he started beginning to vomit consistently last night he started to notice that there was bright red blood coming out with his vomiting.  Despite the hematemesis the patient's admission hemoglobin was 16.2 which a repeat this morning was 15.9.  The patient was sleeping comfortably when I saw him today in consultation but upon wakening had reported that he was having a lot of burning in his throat.  The patient's comprehensive metabolic panel showed his AST to be 60 with an ALT of 136 which was slightly higher during admission yesterday.  His liver enzymes have been around the mid to high 70s during previous ER visits 1 month and 2 months ago.  Patient was found to be Covid negative and admitted to the hospital.  On admission to the hospital his alcohol level was 212 and had been positive on every visit to the emergency room for the last 6 months.  Now being asked to see the patient for hematemesis.  Past Medical History:  Diagnosis Date  . Hypertension   . Panic attacks     History reviewed. No pertinent surgical history.  Prior to Admission medications   Medication Sig  Start Date End Date Taking? Authorizing Provider  carvedilol (COREG) 6.25 MG tablet Take 1 tablet (6.25 mg total) by mouth 2 (two) times daily with a meal. 03/30/19  Yes Clapacs, John T, MD  escitalopram (LEXAPRO) 20 MG tablet Take 20 mg by mouth daily. 09/21/19  Yes [provider]    Family History  Problem Relation Age of Onset  . Hypertension Mother      Social History   Tobacco Use  . Smoking status: Current Every Day Smoker    Packs/day: 1.00    Types: Cigarettes  . Smokeless tobacco: Never Used  Substance Use Topics  . Alcohol use: Yes    Comment: 2-3 pints per day  . Drug use: Not Currently    Comment: heroin multiple times a week    Allergies as of 10/04/2019 - Review Complete 10/04/2019  Allergen Reaction Noted  . Penicillins Hives 02/27/2019    Review of Systems:    All systems reviewed and negative except where noted in HPI.   Physical Exam:  Vital signs in last 24 hours: Temp:  [98.4 F (36.9 C)] 98.4 F (36.9 C) (04/22 0103) Pulse Rate:  [86-116] 86 (04/22 1114) Resp:  [16-22] 22 (04/22 1114) BP: (141-181)/(100-117) 159/106 (04/22 1113) SpO2:  [95 %-99 %] 99 % (04/22 1114) Weight:  [102.1 kg] 102.1  kg (04/22 0101)   General:   Pleasant, cooperative in NAD Head:  Normocephalic and atraumatic. Eyes:   No icterus.   Conjunctiva pink. PERRLA. Ears:  Normal auditory acuity. Neck:  Supple; no masses or thyroidomegaly Lungs: Respirations even and unlabored. Lungs clear to auscultation bilaterally.   No wheezes, crackles, or rhonchi.  Heart:  Regular rate and rhythm;  Without murmur, clicks, rubs or gallops Abdomen:  Soft, nondistended, nontender. Normal bowel sounds. No appreciable masses or hepatomegaly.  No rebound or guarding.  Rectal:  Not performed. Msk:  Symmetrical without gross deformities.    Extremities:  Without edema, cyanosis or clubbing. Neurologic:  Alert and oriented x3;  grossly normal neurologically. Skin:  Intact without  significant lesions or rashes. Cervical Nodes:  No significant cervical adenopathy. Psych:  Alert and cooperative. Normal affect.  LAB RESULTS: Recent Labs    10/04/19 0105 10/04/19 0753 10/04/19 0937  WBC 8.2 14.2* 16.5*  HGB 16.2 15.0 15.9  HCT 47.5 43.8 45.7  PLT 380 332 571*   BMET Recent Labs    10/04/19 0105 10/04/19 0937  NA 141 141  K 3.6 3.6  CL 100 100  CO2 27 27  GLUCOSE 174* 154*  BUN 21* 25*  CREATININE 0.77 0.78  CALCIUM 9.0 8.2*   LFT Recent Labs    10/04/19 0937  PROT 7.1  ALBUMIN 4.0  AST 60*  ALT 137*  ALKPHOS 69  BILITOT 0.8   PT/INR Recent Labs    10/04/19 0753  LABPROT 15.3*  INR 1.2    STUDIES: DG Chest 2 View  Result Date: 10/04/2019 CLINICAL DATA:  Chest pain. EXAM: CHEST - 2 VIEW COMPARISON:  09/24/2019 FINDINGS: The cardiomediastinal contours are normal. [place] scattered lung scarring. Pulmonary vasculature is normal. No consolidation, pleural effusion, or pneumothorax. No acute osseous abnormalities are seen. IMPRESSION: No acute chest findings. Electronically Signed   By: Keith Rake M.D.   On: 10/04/2019 02:08   CT Chest W Contrast  Result Date: 10/04/2019 CLINICAL DATA:  Vomiting. Hematemesis. Chest pain. EXAM: CT CHEST WITH CONTRAST TECHNIQUE: Multidetector CT imaging of the chest was performed during intravenous contrast administration. CONTRAST:  91mL OMNIPAQUE IOHEXOL 300 MG/ML  SOLN COMPARISON:  None. FINDINGS: Cardiovascular: The heart is normal in size. No pericardial effusion. The aorta is normal in caliber. No dissection. No atherosclerotic calcifications. The branch vessels are patent. The pulmonary arteries appear normal. Mediastinum/Nodes: No mediastinal or hilar mass or adenopathy. No hematoma or pneumomediastinum. The esophagus is grossly normal by CT. No findings for esophageal injury. No paraesophageal fluid collections or adenopathy. The GE junction appears normal. Lungs/Pleura: No acute pulmonary findings. No  pleural effusion or pneumothorax. No worrisome pulmonary lesions. Upper Abdomen: No significant upper abdominal findings. No free abdominal fluid collections or free air. Musculoskeletal: No chest wall lesions, supraclavicular or axillary adenopathy. The lower neck is unremarkable. No subcutaneous emphysema. The thyroid gland is unremarkable. The bony thorax is intact. IMPRESSION: 1. Unremarkable CT examination of the chest. No findings for esophageal injury, hematoma or pneumomediastinum. 2. Normal appearance of the heart and great vessels. 3. No acute pulmonary findings or worrisome pulmonary lesions. Electronically Signed   By: Marijo Sanes M.D.   On: 10/04/2019 08:13   DG ESOPHAGUS W SINGLE CM (SOL OR THIN BA)  Result Date: 10/04/2019 CLINICAL DATA:  Vomiting, hematemesis EXAM: ESOPHOGRAM TECHNIQUE: Single contrast examination was performed using  Gastrografin. FLUOROSCOPY TIME:  Fluoroscopy Time:  42 seconds Radiation Exposure Index (if provided by the fluoroscopic  device): 14.1 mGy Number of Acquired Spot Images: 4 full exposures COMPARISON:  CT chest 10/04/2019 FINDINGS: Single-contrast esophagram was performed using a 1:1 dilution of Gastrografin and water. The esophageal motility is within normal limits. No extraluminal contrast to suggest esophageal perforation or injury. There is no evidence of stricture, mass or ulceration. Gastroesophageal junction normally located without hiatal hernia. IMPRESSION: Negative for esophageal tear/perforation. Electronically Signed   By: Duanne Guess D.O.   On: 10/04/2019 09:25      Impression / Plan:   Assessment: Principal Problem:   Hematemesis Active Problems:   Severe anxiety with panic   Alcohol abuse   Leukocytosis   Abnormal LFTs   Nausea vomiting and diarrhea   Hypertension   Tobacco abuse   Abdominal pain   Chest pain   Tyson L Carriker Montez Mendoza. is a 29 y.o. y/o male with chronic alcohol dependence and abuse.  The patient has a history of  hypertension and anxiety and continues to drink.  The patient now reports that he has had enough and he is going to stop drinking.  The patient has had hematemesis despite a normal hemoglobin.  Plan:  The patient has been encouraged to stop drinking in the future and has been told the consequences of continued alcohol use and abuse.  The patient will be set up for an EGD to look at his esophagus for possible esophagitis versus a Mallory-Weiss tear versus peptic ulcer disease as the cause of his hematemesis.  The patient has been explained the plan agrees with it.  Thank you for involving me in the care of this patient.      LOS: 0 days   Zachary Minium, MD  10/04/2019, 12:28 PM Pager [phone] 7am-5pm  Check AMION for 5pm -7am coverage and on weekends   Note: This dictation was prepared with Dragon dictation along with smaller phrase technology. Any transcriptional errors that result from this process are unintentional.

## 2019-10-04 NOTE — Progress Notes (Signed)
Confirmed with MD patient's diet status. Patient can have any diet he can tolerate per GI. Started him off on soft diet. Will continue to monitor .

## 2019-10-04 NOTE — H&P (Addendum)
History and Physical    Frederic L Manes Jr. PJK:[id number] DOB: [birth year] DOA: 10/04/2019  Referring MD/NP/PA:   PCP: [facility]   Patient coming from:  The patient is coming from home.  At baseline, pt is independent for most of ADL.        Chief Complaint: Nausea, vomiting, diarrhea, abdominal pain, hematemesis, chest pain  HPI: Zachary L Chiasson Brooke Bonito. is a 29 y.o. male with medical history significant of hypertension, GERD, anxiety, panic attack, alcohol abuse, tobacco abuse, who presents with nausea vomiting, hematemesis, chest pain  Pt states that his symptoms started yesterday, which has been progressively worsening.  He has nausea, vomiting, diarrhea, epigastric abdominal pain.  He noticed bright red blood when he vomited today.  He has had nearly 10 times of diarrhea. He also complains of chest pain, which is located in the middle half of his chest, sharp, 8 out of 10 severity, nonradiating. Associated with mild shortness breath.  No cough, fever or chills.  He states that he has difficult swallowing due to pain in the midline of his chest and pain in his throat. No symptoms of UTI or unilateral weakness.  ED Course: pt was found to have WBC 14.2, hemoglobin 15.0, INR 1.2, troponin 5 --> 5, lipase 22, alcohol level 212, pending COVID-19 PCR, electrolytes renal function okay, temperature normal, blood pressure 181/117 --> 156/107, tachycardia, RR 22, oxygen saturation 95% on room air.  Chest x-ray negative.  CT of the chest with contrast is negative.  Esophagram is negative for perforation or tear. GI, Dr. Allen Norris is consulted by EDP.  Review of Systems:   General: no fevers, chills, no body weight gain, has poor appetite, has fatigue HEENT: no blurry vision, hearing changes or sore throat Respiratory: has dyspnea, no coughing, wheezing CV: has chest pain, no palpitations GI: has nausea, vomiting, abdominal pain, diarrhea, no constipation.  Has hematemesis. GU: no dysuria,  burning on urination, increased urinary frequency, hematuria  Ext: no leg edema Neuro: no unilateral weakness, numbness, or tingling, no vision change or hearing loss Skin: no rash, no skin tear. MSK: No muscle spasm, no deformity, no limitation of range of movement in spin Heme: No easy bruising.  Travel history: No recent long distant travel.  Allergy:  Allergies  Allergen Reactions  . Penicillins Hives    Past Medical History:  Diagnosis Date  . Hypertension   . Panic attacks     History reviewed. No pertinent surgical history.  Social History:  reports that he has been smoking cigarettes. He has been smoking about 1.00 pack per day. He has never used smokeless tobacco. He reports current alcohol use. He reports previous drug use.  Family History:  Family History  Problem Relation Age of Onset  . Hypertension Mother      Prior to Admission medications   Medication Sig Start Date End Date Taking? Authorizing Provider  carvedilol (COREG) 6.25 MG tablet Take 1 tablet (6.25 mg total) by mouth 2 (two) times daily with a meal. 03/30/19   Clapacs, Madie Reno, MD  famotidine (PEPCID) 20 MG tablet Take 1 tablet (20 mg total) by mouth 2 (two) times daily. 03/30/19   Clapacs, Madie Reno, MD  lisinopril (ZESTRIL) 10 MG tablet Take 1 tablet (10 mg total) by mouth daily. 03/31/19   Clapacs, Madie Reno, MD  LORazepam (ATIVAN) 1 MG tablet Take 1 tablet (1 mg total) by mouth 2 (two) times daily as needed for anxiety. 06/06/19 06/05/20  Arta Silence,  MD  ondansetron (ZOFRAN ODT) 4 MG disintegrating tablet Take 1 tablet (4 mg total) by mouth every 8 (eight) hours as needed for nausea or vomiting. 08/31/19   Shaune Pollack, MD    Physical Exam: Vitals:   10/04/19 0737 10/04/19 0800 10/04/19 0830 10/04/19 0914  BP:  (!) 157/106 (!) 156/107 (!) 146/104  Pulse: 97 91 97 99  Resp:  18 16   Temp:      TempSrc:      SpO2: 99% 98% 95%   Weight:      Height:       General: Not in acute distress.  Mildly tremulous. HEENT:       Eyes: PERRL, EOMI, no scleral icterus.       ENT: No discharge from the ears and nose, no pharynx injection, no tonsillar enlargement.        Neck: No JVD, no bruit, no mass felt. Heme: No neck lymph node enlargement. Cardiac: S1/S2, RRR, No murmurs, No gallops or rubs. Respiratory: No rales, wheezing, rhonchi or rubs. GI: Soft, nondistended, has tenderness in epigastric area, no rebound pain, no organomegaly, BS present. GU: No hematuria Ext: No pitting leg edema bilaterally. 2+DP/PT pulse bilaterally. Musculoskeletal: No joint deformities, No joint redness or warmth, no limitation of ROM in spin. Skin: No rashes.  Neuro: Alert, oriented X3, cranial nerves II-XII grossly intact, moves all extremities normally.   Psych: Patient is not psychotic, no suicidal or hemocidal ideation.  Labs on Admission: I have personally reviewed following labs and imaging studies  CBC: Recent Labs  Lab 10/04/19 0105 10/04/19 0753  WBC 8.2 14.2*  NEUTROABS 5.5  --   HGB 16.2 15.0  HCT 47.5 43.8  MCV 85.6 86.4  PLT 380 332   Basic Metabolic Panel: Recent Labs  Lab 10/04/19 0105  NA 141  K 3.6  CL 100  CO2 27  GLUCOSE 174*  BUN 21*  CREATININE 0.77  CALCIUM 9.0   GFR: Estimated Creatinine Clearance: 162.7 mL/min (by C-G formula based on SCr of 0.77 mg/dL). Liver Function Tests: Recent Labs  Lab 10/04/19 0105  AST 89*  ALT 167*  ALKPHOS 73  BILITOT 1.0  PROT 7.6  ALBUMIN 4.4   Recent Labs  Lab 10/04/19 0753  LIPASE 22   No results for input(s): AMMONIA in the last 168 hours. Coagulation Profile: Recent Labs  Lab 10/04/19 0753  INR 1.2   Cardiac Enzymes: No results for input(s): CKTOTAL, CKMB, CKMBINDEX, TROPONINI in the last 168 hours. BNP (last 3 results) No results for input(s): PROBNP in the last 8760 hours. HbA1C: No results for input(s): HGBA1C in the last 72 hours. CBG: No results for input(s): GLUCAP in the last 168  hours. Lipid Profile: No results for input(s): CHOL, HDL, LDLCALC, TRIG, CHOLHDL, LDLDIRECT in the last 72 hours. Thyroid Function Tests: No results for input(s): TSH, T4TOTAL, FREET4, T3FREE, THYROIDAB in the last 72 hours. Anemia Panel: No results for input(s): VITAMINB12, FOLATE, FERRITIN, TIBC, IRON, RETICCTPCT in the last 72 hours. Urine analysis:    Component Value Date/Time   COLORURINE YELLOW (A) 03/28/2019 1442   APPEARANCEUR CLEAR (A) 03/28/2019 1442   APPEARANCEUR Cloudy 09/09/2013 2112   LABSPEC 1.012 03/28/2019 1442   LABSPEC 1.014 09/09/2013 2112   PHURINE 6.0 03/28/2019 1442   GLUCOSEU NEGATIVE 03/28/2019 1442   GLUCOSEU Negative 09/09/2013 2112   HGBUR NEGATIVE 03/28/2019 1442   BILIRUBINUR NEGATIVE 03/28/2019 1442   BILIRUBINUR Negative 09/09/2013 2112   KETONESUR NEGATIVE 03/28/2019 1442  PROTEINUR NEGATIVE 03/28/2019 1442   NITRITE NEGATIVE 03/28/2019 1442   LEUKOCYTESUR NEGATIVE 03/28/2019 1442   LEUKOCYTESUR Negative 09/09/2013 2112   Sepsis Labs: @LABRCNTIP (procalcitonin:4,lacticidven:4) )No results found for this or any previous visit (from the past 240 hour(s)).   Radiological Exams on Admission: DG Chest 2 View  Result Date: 10/04/2019 CLINICAL DATA:  Chest pain. EXAM: CHEST - 2 VIEW COMPARISON:  09/24/2019 FINDINGS: The cardiomediastinal contours are normal. Zeiter scattered lung scarring. Pulmonary vasculature is normal. No consolidation, pleural effusion, or pneumothorax. No acute osseous abnormalities are seen. IMPRESSION: No acute chest findings. Electronically Signed   By: 11/24/2019 M.D.   On: 10/04/2019 02:08   CT Chest W Contrast  Result Date: 10/04/2019 CLINICAL DATA:  Vomiting. Hematemesis. Chest pain. EXAM: CT CHEST WITH CONTRAST TECHNIQUE: Multidetector CT imaging of the chest was performed during intravenous contrast administration. CONTRAST:  11mL OMNIPAQUE IOHEXOL 300 MG/ML  SOLN COMPARISON:  None. FINDINGS: Cardiovascular: The  heart is normal in size. No pericardial effusion. The aorta is normal in caliber. No dissection. No atherosclerotic calcifications. The branch vessels are patent. The pulmonary arteries appear normal. Mediastinum/Nodes: No mediastinal or hilar mass or adenopathy. No hematoma or pneumomediastinum. The esophagus is grossly normal by CT. No findings for esophageal injury. No paraesophageal fluid collections or adenopathy. The GE junction appears normal. Lungs/Pleura: No acute pulmonary findings. No pleural effusion or pneumothorax. No worrisome pulmonary lesions. Upper Abdomen: No significant upper abdominal findings. No free abdominal fluid collections or free air. Musculoskeletal: No chest wall lesions, supraclavicular or axillary adenopathy. The lower neck is unremarkable. No subcutaneous emphysema. The thyroid gland is unremarkable. The bony thorax is intact. IMPRESSION: 1. Unremarkable CT examination of the chest. No findings for esophageal injury, hematoma or pneumomediastinum. 2. Normal appearance of the heart and great vessels. 3. No acute pulmonary findings or worrisome pulmonary lesions. Electronically Signed   By: 70m M.D.   On: 10/04/2019 08:13   DG ESOPHAGUS W SINGLE CM (SOL OR THIN BA)  Result Date: 10/04/2019 CLINICAL DATA:  Vomiting, hematemesis EXAM: ESOPHOGRAM TECHNIQUE: Single contrast examination was performed using  Gastrografin. FLUOROSCOPY TIME:  Fluoroscopy Time:  42 seconds Radiation Exposure Index (if provided by the fluoroscopic device): 14.1 mGy Number of Acquired Spot Images: 4 full exposures COMPARISON:  CT chest 10/04/2019 FINDINGS: Single-contrast esophagram was performed using a 1:1 dilution of Gastrografin and water. The esophageal motility is within normal limits. No extraluminal contrast to suggest esophageal perforation or injury. There is no evidence of stricture, mass or ulceration. Gastroesophageal junction normally located without hiatal hernia. IMPRESSION:  Negative for esophageal tear/perforation. Electronically Signed   By: 10/06/2019 D.O.   On: 10/04/2019 09:25     EKG: Independently reviewed.  Sinus rhythm, tachycardia, QTC 452, incomplete right bundle blockade, nonspecific T wave change  Assessment/Plan Principal Problem:   Hematemesis Active Problems:   Severe anxiety with panic   Alcohol abuse   Leukocytosis   Abnormal LFTs   Nausea vomiting and diarrhea   Hypertension   Tobacco abuse   Abdominal pain   Chest pain   Hematemesis: Hgb stable. CT-chest is negative for esophageal perforation. Esophagram is negative for perforation or tear.  Potential differential diagnosis is Mallory-Weiss syndrome. Hemoglobin stable. Dr. 10/06/2019 of GI is consulted by EDP.  - will admitted to progressive bed as inpatient - GI consulted by Ed, will follow up recommendations - NPO - IVF: 2L NS bolus, then at 125 mL/hr - Start IV pantoprazole 40 bid - Zofran  IV for nausea - Avoid NSAIDs and SQ heparin - Maintain IV access (2 large bore IVs if possible). - Monitor closely and follow q6h cbc, transfuse as necessary, if Hgb<7.0 - LaB: INR, PTT and type screen  Severe anxiety with panic: -Lexapro  Tobacco abuse and Alcohol abuse: -Did counseling about importance of quitting smoking and drinking alcohol -Nicotine patch -CIWA protocol  Nausea & vomiting, diarrhea and abdominal pain: his nausea vomiting, epigastric abdominal pain are likely due to alcoholic gastritis and esophagitis.  Lipase normal.  Etiology for diarrhea is not clear.  Need to rule out C. difficile colitis. -On IV Protonix -As needed Zofran -IV fluid as above -As needed morphine for pain - f/u C. difficile PCR and GI pathogen panel  Leukocytosis: WBC 14.2. No fever, possibly reactive leukocytosis. Pt has sore throat.  -Follow-up rapid strep PCR -Blood culture   Abnormal LFTs: ALP 73, AST 89, ALT 167, total bilirubin 1.0, likely due to alcohol abuse -Check hepatitis  panel and HIV antibody -Avoid using Tylenol  HTN:  -Continue home medications: Coreg  -hydralazine prn  Chest pain: Patient described his chest pain is located in the midline of his chest, which is likely due to alcoholic esophagitis.  Troponin negative twice 5 -->5.  -As needed morphine for pain -Patient is on IV Protonix    DVT ppx: SCD Code Status: Full code Family Communication: not done, no family member is at bed side.     Disposition Plan:  Anticipate discharge back to previous home environment Consults called:  Dr. Servando Snare of GI Admission status: progressive unit as inpt    Status is: Inpatient Remains inpatient appropriate because: see below Dispo: The patient is from: Home              Anticipated d/c is to: Home              Anticipated d/c date is: 2 days              Patient currently is not medically stable to d/c.  Inpatient status:  # Patient requires inpatient status due to high intensity of service, high risk for further deterioration and high frequency of surveillance required.  I certify that at the point of admission it is my clinical judgment that the patient will require inpatient hospital care spanning beyond 2 midnights from the point of admission.  . This patient has multiple chronic comorbidities including hypertension, GERD, anxiety, panic attack, alcohol abuse, tobacco abuse . Now patient has presenting with nausea, vomiting, abdominal pain, hematemesis, diarrhea and chest pain. Also has leukocytosis . The worrisome physical exam findings include tremulous and epigastric abdominal tenderness . The initial radiographic and laboratory data are worrisome because of leukocytosis, elevated alcohol level . Current medical needs: please see my assessment and plan . Predictability of an adverse outcome (risk): Patient has multiple comorbidities as listed above. Now presents with  nausea, vomiting, diarrhea abdominal pain, hematemesis and chest pain. also has  leukocytosis. Pt is at high risk of alcohol withdrawal. Patient's presentation is highly complicated.  Patient is at high risk of deteriorating.  Will need to be treated in hospital for at least 2 days.          Date of Service 10/04/2019    Lorretta Harp [facility]   If 7PM-7AM, please contact night-coverage [email or web address] 10/04/2019, 9:48 AM

## 2019-10-04 NOTE — Progress Notes (Signed)
Patient asleep.

## 2019-10-04 NOTE — Op Note (Signed)
[facility] Patient Name: Zachary Mendoza Procedure Date: 10/04/2019 12:00 PM MRN: [id number] Account #: [id number] Date of Birth: 17-Sep-1990 Admit Type: Inpatient Age: 29 Room: [facility] ENDO ROOM 1 Gender: Male Note Status: Finalized Procedure:             Upper GI endoscopy Indications:           Hematemesis Providers:             Midge Minium MD, MD Referring MD:          No Local Md, MD (Referring MD) Medicines:             Propofol per Anesthesia Complications:         No immediate complications. Procedure:             Pre-Anesthesia Assessment:                        - Prior to the procedure, a History and Physical was                         performed, and patient medications and allergies were                         reviewed. The patient's tolerance of previous                         anesthesia was also reviewed. The risks and benefits                         of the procedure and the sedation options and risks                         were discussed with the patient. All questions were                         answered, and informed consent was obtained. Prior                         Anticoagulants: The patient has taken no previous                         anticoagulant or antiplatelet agents. ASA Grade                         Assessment: II - A patient with mild systemic disease.                         After reviewing the risks and benefits, the patient                         was deemed in satisfactory condition to undergo the                         procedure.                        After obtaining informed consent, the endoscope was  passed under direct vision. Throughout the procedure,                         the patient's blood pressure, pulse, and oxygen                         saturations were monitored continuously. The Endoscope                         was introduced through the mouth, and advanced to the                   second part of duodenum. The upper GI endoscopy was                         accomplished without difficulty. The patient tolerated                         the procedure well. Findings:      A small hiatal hernia was present.      LA Grade D (one or more mucosal breaks involving at least 75% of       esophageal circumference) esophagitis with no bleeding was found in the       lower third of the esophagus.      Localized moderately erythematous mucosa without bleeding was found in       the gastric fundus.      The examined duodenum was normal. Impression:            - Small hiatal hernia.                        - LA Grade D reflux esophagitis with no bleeding.                        - Erythematous mucosa in the gastric fundus from                         vomiting.                        - Normal examined duodenum.                        - No specimens collected. Recommendation:        - Return patient to hospital ward for ongoing care.                        - Mechanical soft diet.                        - Continue present medications.                        - Use a proton pump inhibitor PO BID. Procedure Code(s):     --- Professional ---                        [phone], Esophagogastroduodenoscopy, flexible,                         transoral; diagnostic, including collection of  specimen(s) by brushing or washing, when performed                         (separate procedure) Diagnosis Code(s):     --- Professional ---                        K92.0, Hematemesis                        K21.00 CPT copyright 2019 American [employer] Association. All rights reserved. The codes documented in this report are preliminary and upon coder review may  be revised to meet current compliance requirements. Lucilla Lame MD, MD 10/04/2019 1:20:14 PM This report has been signed electronically. Number of Addenda: 0 Note Initiated On: 10/04/2019 12:00 PM Estimated Blood  Loss:  Estimated blood loss: none.      [facility]

## 2019-10-04 NOTE — Progress Notes (Signed)
The patient had an EGD with esophagitis without any fresh or old blood seen.  The patient should be treated with a PPI.  There is no sign of a Mallory-Weiss tear or peptic ulcer disease.  There is some erythema of the top part of the stomach from recurrent vomiting.  The patient should be treated with a PPI twice a day and stop drinking.  Nothing further to do from a GI point of view.  I will sign off.  Please call if any further GI concerns or questions.  We would like to thank you for the opportunity to participate in the care of Zachary Mendoza.Marland Kitchen

## 2019-10-04 NOTE — ED Triage Notes (Signed)
Patient ambulatory to triage with steady gait, without difficulty or distress noted, mask in place; pt st that he drank today and has been vomiting, now having mid CP and feels like he is having a panic attack

## 2019-10-04 NOTE — Anesthesia Preprocedure Evaluation (Signed)
Anesthesia Evaluation  Patient identified by MRN, date of birth, ID band Patient awake    Reviewed: Allergy & Precautions, NPO status , Patient's Chart, lab work & pertinent test results  History of Anesthesia Complications Negative for: history of anesthetic complications  Airway Mallampati: III       Dental   Pulmonary neg sleep apnea, neg COPD, Current Smoker,           Cardiovascular hypertension, Pt. on medications and Pt. on home beta blockers (-) Past MI and (-) CHF (-) dysrhythmias (-) Valvular Problems/Murmurs     Neuro/Psych neg Seizures Anxiety    GI/Hepatic Neg liver ROS, GERD  ,  Endo/Other  neg diabetes  Renal/GU negative Renal ROS     Musculoskeletal   Abdominal   Peds  Hematology   Anesthesia Other Findings   Reproductive/Obstetrics                             Anesthesia Physical Anesthesia Plan  ASA: II  Anesthesia Plan: General   Post-op Pain Management:    Induction: Intravenous  PONV Risk Score and Plan: 1 and Propofol infusion and TIVA  Airway Management Planned: Nasal Cannula  Additional Equipment:   Intra-op Plan:   Post-operative Plan:   Informed Consent: I have reviewed the patients History and Physical, chart, labs and discussed the procedure including the risks, benefits and alternatives for the proposed anesthesia with the patient or authorized representative who has indicated his/her understanding and acceptance.       Plan Discussed with:   Anesthesia Plan Comments:         Anesthesia Quick Evaluation

## 2019-10-04 NOTE — OR Nursing (Signed)
Pt ready for transport to room 252.

## 2019-10-04 NOTE — ED Notes (Signed)
Patient given Zofran as ordered per protocol for c/o nausea.

## 2019-10-04 NOTE — Anesthesia Postprocedure Evaluation (Signed)
Anesthesia Post Note  Patient: Zachary Medlen Karp Jr.  Procedure(s) Performed: ESOPHAGOGASTRODUODENOSCOPY (EGD) WITH PROPOFOL (N/A )  Patient location during evaluation: Endoscopy Anesthesia Type: General Level of consciousness: awake and alert Pain management: pain level controlled Vital Signs Assessment: post-procedure vital signs reviewed and stable Respiratory status: spontaneous breathing and respiratory function stable Cardiovascular status: stable Anesthetic complications: no     Last Vitals:  Vitals:   10/04/19 1140 10/04/19 1320  BP: 137/88 137/88  Pulse:  86  Resp:  (!) 22  Temp:  (!) 36.3 C  SpO2:  100%    Last Pain:  Vitals:   10/04/19 0959  TempSrc:   PainSc: Asleep                 Zachary Mendoza K

## 2019-10-05 ENCOUNTER — Encounter: Payer: Self-pay | Admitting: *Deleted

## 2019-10-05 LAB — COMPREHENSIVE METABOLIC PANEL
ALT: 105 U/L — ABNORMAL HIGH (ref 0–44)
AST: 51 U/L — ABNORMAL HIGH (ref 15–41)
Albumin: 3.4 g/dL — ABNORMAL LOW (ref 3.5–5.0)
Alkaline Phosphatase: 73 U/L (ref 38–126)
Anion gap: 10 (ref 5–15)
BUN: 25 mg/dL — ABNORMAL HIGH (ref 6–20)
CO2: 26 mmol/L (ref 22–32)
Calcium: 8.4 mg/dL — ABNORMAL LOW (ref 8.9–10.3)
Chloride: 106 mmol/L (ref 98–111)
Creatinine, Ser: 0.85 mg/dL (ref 0.61–1.24)
GFR calc Af Amer: >60 mL/min (ref >60)
GFR calc non Af Amer: >60 mL/min (ref >60)
Glucose, Bld: 112 mg/dL — ABNORMAL HIGH (ref 70–99)
Potassium: 3.7 mmol/L (ref 3.5–5.1)
Sodium: 142 mmol/L (ref 135–145)
Total Bilirubin: 1.3 mg/dL — ABNORMAL HIGH (ref 0.3–1.2)
Total Protein: 5.8 g/dL — ABNORMAL LOW (ref 6.5–8.1)

## 2019-10-05 LAB — CBC
HCT: 37.2 % — ABNORMAL LOW (ref 39.0–52.0)
HCT: 36 % — ABNORMAL LOW (ref 39.0–52.0)
HCT: 37.5 % — ABNORMAL LOW (ref 39.0–52.0)
HCT: 37.4 % — ABNORMAL LOW (ref 39.0–52.0)
Hemoglobin: 12.5 g/dL — ABNORMAL LOW (ref 13.0–17.0)
Hemoglobin: 12.4 g/dL — ABNORMAL LOW (ref 13.0–17.0)
Hemoglobin: 12.9 g/dL — ABNORMAL LOW (ref 13.0–17.0)
Hemoglobin: 12.9 g/dL — ABNORMAL LOW (ref 13.0–17.0)
MCH: 29.3 pg (ref 26.0–34.0)
MCH: 29.9 pg (ref 26.0–34.0)
MCH: 29.7 pg (ref 26.0–34.0)
MCH: 29.5 pg (ref 26.0–34.0)
MCHC: 33.6 g/dL (ref 30.0–36.0)
MCHC: 34.4 g/dL (ref 30.0–36.0)
MCHC: 34.4 g/dL (ref 30.0–36.0)
MCHC: 34.5 g/dL (ref 30.0–36.0)
MCV: 87.1 fL (ref 80.0–100.0)
MCV: 86.7 fL (ref 80.0–100.0)
MCV: 86.2 fL (ref 80.0–100.0)
MCV: 85.6 fL (ref 80.0–100.0)
Platelets: 210 10*3/uL (ref 150–400)
Platelets: 202 10*3/uL (ref 150–400)
Platelets: 196 10*3/uL (ref 150–400)
Platelets: 195 10*3/uL (ref 150–400)
RBC: 4.27 MIL/uL (ref 4.22–5.81)
RBC: 4.15 MIL/uL — ABNORMAL LOW (ref 4.22–5.81)
RBC: 4.35 MIL/uL (ref 4.22–5.81)
RBC: 4.37 MIL/uL (ref 4.22–5.81)
RDW: 12.1 % (ref 11.5–15.5)
RDW: 12.1 % (ref 11.5–15.5)
RDW: 12 % (ref 11.5–15.5)
RDW: 12 % (ref 11.5–15.5)
WBC: 9.2 10*3/uL (ref 4.0–10.5)
WBC: 7.2 10*3/uL (ref 4.0–10.5)
WBC: 7.4 10*3/uL (ref 4.0–10.5)
WBC: 6.2 10*3/uL (ref 4.0–10.5)
nRBC: 0 % (ref 0.0–0.2)
nRBC: 0 % (ref 0.0–0.2)
nRBC: 0 % (ref 0.0–0.2)
nRBC: 0 % (ref 0.0–0.2)

## 2019-10-05 LAB — MAGNESIUM: Magnesium: 2.2 mg/dL (ref 1.7–2.4)

## 2019-10-05 MED ORDER — SUCRALFATE 1 GM/10ML PO SUSP
1.0000 g | Freq: Three times a day (TID) | ORAL | Status: DC
  Administered 2019-10-05 – 2019-10-06 (×5): 1 g via ORAL
  Filled 2019-10-05 (×8): qty 10

## 2019-10-05 MED ORDER — ACETAMINOPHEN 325 MG PO TABS
650.0000 mg | ORAL_TABLET | Freq: Four times a day (QID) | ORAL | Status: DC | PRN
  Administered 2019-10-05: 650 mg via ORAL
  Filled 2019-10-05: qty 2

## 2019-10-05 MED ORDER — PANTOPRAZOLE SODIUM 40 MG PO TBEC
40.0000 mg | DELAYED_RELEASE_TABLET | Freq: Two times a day (BID) | ORAL | Status: DC
  Administered 2019-10-05 – 2019-10-06 (×2): 40 mg via ORAL
  Filled 2019-10-05 (×2): qty 1

## 2019-10-05 MED ORDER — HYDRALAZINE HCL 20 MG/ML IJ SOLN
5.0000 mg | INTRAMUSCULAR | Status: DC | PRN
  Administered 2019-10-05: 5 mg via INTRAVENOUS
  Filled 2019-10-05: qty 1

## 2019-10-05 MED ORDER — CLONIDINE HCL 0.1 MG PO TABS
0.2000 mg | ORAL_TABLET | Freq: Three times a day (TID) | ORAL | Status: DC | PRN
  Administered 2019-10-05: 0.2 mg via ORAL
  Filled 2019-10-05: qty 2

## 2019-10-05 MED ORDER — CHLORDIAZEPOXIDE HCL 25 MG PO CAPS
50.0000 mg | ORAL_CAPSULE | Freq: Three times a day (TID) | ORAL | Status: DC
  Administered 2019-10-05 – 2019-10-06 (×4): 50 mg via ORAL
  Filled 2019-10-05 (×5): qty 2

## 2019-10-05 NOTE — Hospital Course (Signed)
Zachary Mendoza. is a 29 y.o. male with medical history significant of hypertension, GERD, anxiety, panic attack, alcohol dependence, tobacco abuse, who presented to the ED on 10/04/19 with nausea, vomiting with hematemesis, diarrhea and epigastric/chest pain after reportedly drinking heavily for past several days after having been sober for about one month.  In the ED, mildly tachycardic and hypertensive but otherwise vitals stable. Labs notable for leukocytosis 14.2k, normal troponin x2, lipase 22, serum Etoh 212.  Chest x-ray negative.  CT of the chest with contrast is negative.  Esophagram is negative for perforation or tear.  Admitted to hospitalist service with GI consulted.    Patient now in alcohol withdrawal, treating with Librium and PRN Ativan per CIWA protocol.

## 2019-10-05 NOTE — Progress Notes (Signed)
Report called to Annabella, RN.

## 2019-10-05 NOTE — Progress Notes (Signed)
PROGRESS NOTE    Zachary L Rojek Jr.   LNL:[id number]  DOB: 18-Jul-1990  PCP: [facility]    DOA: 10/04/2019 LOS: 1   Brief Narrative   Zachary Anchors Yarde Montez Hageman. is a 29 y.o. male with medical history significant of hypertension, GERD, anxiety, panic attack, alcohol dependence, tobacco abuse, who presented to the ED on 10/04/19 with nausea, vomiting with hematemesis, diarrhea and epigastric/chest pain after reportedly drinking heavily for past several days after having been sober for about one month.  In the ED, mildly tachycardic and hypertensive but otherwise vitals stable. Labs notable for leukocytosis 14.2k, normal troponin x2, lipase 22, serum Etoh 212.  Chest x-ray negative.  CT of the chest with contrast is negative.  Esophagram is negative for perforation or tear.  Admitted to hospitalist service with GI consulted.    Patient now in alcohol withdrawal, treating with Librium and PRN Ativan per CIWA protocol.    Assessment & Plan   Principal Problem:   Hematemesis Active Problems:   Severe anxiety with panic   Alcohol abuse   Leukocytosis   Abnormal LFTs   Nausea vomiting and diarrhea   Hypertension   Tobacco abuse   Abdominal pain   Chest pain   Esophagitis  Uncomplicated Alcohol Withdrawal Alcohol Dependence - patient reports drinking about 2 pints liquor daily, had been sober about a month, then drank heavily for 5 days prior to admission.  He says he is determined to stay off alcohol this time and reports he has a good social support network to help him maintain sobriety.  No history of withdrawal seizures or full DT's.  Has had hallucinations once during prior withdrawal. --Librium taper started --continue PRN Ativan per CIWA protocol  Hematemesis: Hgb stable on admission. CT chest is negative for esophageal perforation. Esophagram is negative for perforation or tear.  Potential differential diagnosis is Mallory-Weiss syndrome vs hemorrhagic  gastritis/esophagitis, PUD. Hemoglobin has remained stable.  GI consulted and patient underwent EGD on 4/22 which showed LA Grade D reflux esophagitis without bleeding, erythematous gastric mucosa from vomiting, normal duodenum.  Patient started on oral PPI twice daily.  No further episodes of hematemesis. --soft diet, advance as tolerated  --cont IV fluids for now since having pain w swallowing still --cont IV pantoprazole 40 bid for now, will switch to PO on discharge --Zofran IV for nausea --Avoid NSAIDs and anticoagulants --repeat CBC in AM --transfuse if Hbg < 7.0 --transfer to Med/surg  Severe anxiety with panic attacks - currently exacerbated by acute illness and alcohol withdrawal. --continue home Lexapro --Librium and PRN Ativan as above for Etoh withdrawal  Tobacco abuse - nicotine patch.  Counseled patient on importance of smoking cessation for his long term health.  Nausea & vomiting, diarrhea and abdominal pain: due to alcoholic gastritis and esophagitis.  Lipase normal.  Etiology for diarrhea is not clear, C diff and GI panel negative.  PRN Zofran.  IV fluids as above.  Leukocytosis: WBC 14.2. No fever, possibly reactive leukocytosis in setting of vomiting and bleeding. Pt has sore throat, due to vomiting most likely.  Strep PCR negative.  --Chloroseptic spray --follow blood cultures  Abnormal LFTs: ALP 73, AST 89, ALT 167, total bilirubin 1.0, likely due to alcohol abuse --monitor & have PCP recheck in 4-6 weeks  HTN:  --Continue home Coreg  --hydralazine prn  Chest pain: Patient described his chest pain is located in the midline of his chest, which is likely due to alcoholic esophagitis.  Troponin  negative twice 5 -->5.  -PRN morphine  -IV Protonix  Patient BMI: Body mass index is 33.67 kg/m.   DVT prophylaxis: SCD's  Diet:  Diet Orders (From admission, onward)    Start     Ordered   10/04/19 1803  DIET SOFT Room service appropriate? Yes; Fluid  consistency: Thin  Diet effective now    Question Answer Comment  Room service appropriate? Yes   Fluid consistency: Thin      10/04/19 1802            Code Status: Full Code    Subjective 10/05/19    Patient seen at bedside.  No acute events.  He reports that his anxiety is getting bad, feeling near panic.  He has started to have mild tremors.  Has sore throat, using spray and it helps.  No more nausea or vomiting.  Still has some epigastric/chest discomfort.  Tolerating soft diet. Discussed Librium taper for his withdrawal and he is agreeable.  States he is determined to quit drinking and reports he has a good social support system to help him.   Disposition Plan & Communication   Dispo & Barriers: Expect d/c home tomorrow if EtOh withdrawal symptoms improving and no longer requiring IV medications, tolerating diet  Coming from: home Exp d/c date: 4/24 Medically stable for d/c? no  Family Communication: none at bedside, will attempt to call    Consults, Procedures, Significant Events   Consultants:   Gastroenterology  Procedures:   EGD 10/05/19  Antimicrobials:   None    Objective   Vitals:   10/05/19 0302 10/05/19 0500 10/05/19 0808 10/05/19 0811  BP: (!) 154/99  (!) 162/112 (!) 159/109  Pulse: 81  96 88  Resp: 18  20 20   Temp: 97.6 F (36.4 C)  97.9 F (36.6 C) 97.9 F (36.6 C)  TempSrc:   Oral Oral  SpO2: 100%  99% 98%  Weight:  109.5 kg    Height:        Intake/Output Summary (Last 24 hours) at 10/05/2019 1239 Last data filed at 10/05/2019 1028 Gross per 24 hour  Intake 1291.81 ml  Output --  Net 1291.81 ml   Filed Weights   10/04/19 0101 10/04/19 1520 10/05/19 0500  Weight: 102.1 kg 111.2 kg 109.5 kg    Physical Exam:  General exam: awake, alert, no acute distress HEENT: moist mucus membranes, hearing grossly normal, face flushed  Respiratory system: CTAB, no wheezes, rales or rhonchi, normal respiratory effort. Cardiovascular system:  normal S1/S2, RRR, no pedal edema.   Gastrointestinal system: epigastric tenderness without rebound, non-distended, +bowel sounds. Central nervous system: A&O x4. no gross focal neurologic deficits, normal speech Extremities: moves all, normal tone, mild tremors of bilateral hands Skin: dry, intact, normal temperature, normal color Psychiatry: anxious mood, congruent affect, judgement and insight appear normal, no apparent hallucinations  Labs   Data Reviewed: I have personally reviewed following labs and imaging studies  CBC: Recent Labs  Lab 10/04/19 0105 10/04/19 0753 10/04/19 0937 10/04/19 1538 10/04/19 2036 10/05/19 0250 10/05/19 0916  WBC 8.2   < > 16.5* 17.7* 13.9* 9.2 7.2  NEUTROABS 5.5  --   --   --   --   --   --   HGB 16.2   < > 15.9 14.1 13.0 12.5* 12.4*  HCT 47.5   < > 45.7 42.1 38.2* 37.2* 36.0*  MCV 85.6   < > 85.3 87.7 88.0 87.1 86.7  PLT 380   < >  571* 287 260 210 202   < > = values in this interval not displayed.   Basic Metabolic Panel: Recent Labs  Lab 10/04/19 0105 10/04/19 0937 10/05/19 0250  NA 141 141 142  K 3.6 3.6 3.7  CL 100 100 106  CO2 27 27 26   GLUCOSE 174* 154* 112*  BUN 21* 25* 25*  CREATININE 0.77 0.78 0.85  CALCIUM 9.0 8.2* 8.4*   GFR: Estimated Creatinine Clearance: 158.5 mL/min (by C-G formula based on SCr of 0.85 mg/dL). Liver Function Tests: Recent Labs  Lab 10/04/19 0105 10/04/19 0937 10/05/19 0250  AST 89* 60* 51*  ALT 167* 137* 105*  ALKPHOS 73 69 73  BILITOT 1.0 0.8 1.3*  PROT 7.6 7.1 5.8*  ALBUMIN 4.4 4.0 3.4*   Recent Labs  Lab 10/04/19 0753  LIPASE 22   No results for input(s): AMMONIA in the last 168 hours. Coagulation Profile: Recent Labs  Lab 10/04/19 0753  INR 1.2   Cardiac Enzymes: No results for input(s): CKTOTAL, CKMB, CKMBINDEX, TROPONINI in the last 168 hours. BNP (last 3 results) No results for input(s): PROBNP in the last 8760 hours. HbA1C: No results for input(s): HGBA1C in the last 72  hours. CBG: No results for input(s): GLUCAP in the last 168 hours. Lipid Profile: No results for input(s): CHOL, HDL, LDLCALC, TRIG, CHOLHDL, LDLDIRECT in the last 72 hours. Thyroid Function Tests: No results for input(s): TSH, T4TOTAL, FREET4, T3FREE, THYROIDAB in the last 72 hours. Anemia Panel: No results for input(s): VITAMINB12, FOLATE, FERRITIN, TIBC, IRON, RETICCTPCT in the last 72 hours. Sepsis Labs: No results for input(s): PROCALCITON, LATICACIDVEN in the last 168 hours.  Recent Results (from the past 240 hour(s))  Respiratory Panel by RT PCR (Flu A&B, Covid) - Nasopharyngeal Swab     Status: None   Collection Time: 10/04/19  9:37 AM   Specimen: Nasopharyngeal Swab  Result Value Ref Range Status   SARS Coronavirus 2 by RT PCR NEGATIVE NEGATIVE Final    Comment: (NOTE) SARS-CoV-2 target nucleic acids are NOT DETECTED. The SARS-CoV-2 RNA is generally detectable in upper respiratoy specimens during the acute phase of infection. The lowest concentration of SARS-CoV-2 viral copies this assay can detect is 131 copies/mL. A negative result does not preclude SARS-Cov-2 infection and should not be used as the sole basis for treatment or other patient management decisions. A negative result may occur with  improper specimen collection/handling, submission of specimen other than nasopharyngeal swab, presence of viral mutation(s) within the areas targeted by this assay, and inadequate number of viral copies (<131 copies/mL). A negative result must be combined with clinical observations, patient history, and epidemiological information. The expected result is Negative. Fact Sheet for Patients:  https://www.moore.com/https://www.fda.gov/media/142436/download Fact Sheet for Healthcare Providers:  https://www.young.biz/https://www.fda.gov/media/142435/download This test is not yet ap proved or cleared by the [place] FDA and  has been authorized for detection and/or diagnosis of SARS-CoV-2 by FDA under an Emergency Use  Authorization (EUA). This EUA will remain  in effect (meaning this test can be used) for the duration of the COVID-19 declaration under Section 564(b)(1) of the Act, 21 U.S.C. section 360bbb-3(b)(1), unless the authorization is terminated or revoked sooner.    Influenza A by PCR NEGATIVE NEGATIVE Final   Influenza B by PCR NEGATIVE NEGATIVE Final    Comment: (NOTE) The Xpert Xpress SARS-CoV-2/FLU/RSV assay is intended as an aid in  the diagnosis of influenza from Nasopharyngeal swab specimens and  should not be used as a sole basis for  treatment. Nasal washings and  aspirates are unacceptable for Xpert Xpress SARS-CoV-2/FLU/RSV  testing. Fact Sheet for Patients: https://www.moore.com/ Fact Sheet for Healthcare Providers: https://www.young.biz/ This test is not yet approved or cleared by the [place] FDA and  has been authorized for detection and/or diagnosis of SARS-CoV-2 by  FDA under an Emergency Use Authorization (EUA). This EUA will remain  in effect (meaning this test can be used) for the duration of the  Covid-19 declaration under Section 564(b)(1) of the Act, 21  U.S.C. section 360bbb-3(b)(1), unless the authorization is  terminated or revoked. Performed at [facility], [address] [place], [place] [address]   C Difficile Quick Screen w PCR reflex     Status: None   Collection Time: 10/04/19 11:55 AM   Specimen: STOOL  Result Value Ref Range Status   C Diff antigen NEGATIVE NEGATIVE Final   C Diff toxin NEGATIVE NEGATIVE Final   C Diff interpretation No C. difficile detected.  Final    Comment: Performed at [facility], [address] [place], [place] [address]  Gastrointestinal Panel by PCR , Stool     Status: None   Collection Time: 10/04/19 11:55 AM   Specimen: STOOL  Result Value Ref Range Status   Campylobacter species NOT DETECTED NOT DETECTED Final   Plesimonas shigelloides NOT DETECTED NOT  DETECTED Final   Salmonella species NOT DETECTED NOT DETECTED Final   Yersinia enterocolitica NOT DETECTED NOT DETECTED Final   Vibrio species NOT DETECTED NOT DETECTED Final   Vibrio cholerae NOT DETECTED NOT DETECTED Final   Enteroaggregative E coli (EAEC) NOT DETECTED NOT DETECTED Final   Enteropathogenic E coli (EPEC) NOT DETECTED NOT DETECTED Final   Enterotoxigenic E coli (ETEC) NOT DETECTED NOT DETECTED Final   Shiga like toxin producing E coli (STEC) NOT DETECTED NOT DETECTED Final   Shigella/Enteroinvasive E coli (EIEC) NOT DETECTED NOT DETECTED Final   Cryptosporidium NOT DETECTED NOT DETECTED Final   Cyclospora cayetanensis NOT DETECTED NOT DETECTED Final   Entamoeba histolytica NOT DETECTED NOT DETECTED Final   Giardia lamblia NOT DETECTED NOT DETECTED Final   Adenovirus F40/41 NOT DETECTED NOT DETECTED Final   Astrovirus NOT DETECTED NOT DETECTED Final   Norovirus GI/GII NOT DETECTED NOT DETECTED Final   Rotavirus A NOT DETECTED NOT DETECTED Final   Sapovirus (I, II, IV, and V) NOT DETECTED NOT DETECTED Final    Comment: Performed at [facility], [address] [place], [place] [address]  Culture, blood (Routine X 2) w Reflex to ID Panel     Status: None (Preliminary result)   Collection Time: 10/04/19  4:12 PM   Specimen: BLOOD  Result Value Ref Range Status   Specimen Description BLOOD LEFT ANTECUBITAL  Final   Special Requests   Final    BOTTLES DRAWN AEROBIC AND ANAEROBIC Blood Culture adequate volume   Culture   Final    NO GROWTH < 24 HOURS Performed at [facility], [address] [place], [place] [address]    Report Status PENDING  Incomplete  Culture, blood (Routine X 2) w Reflex to ID Panel     Status: None (Preliminary result)   Collection Time: 10/04/19  4:12 PM   Specimen: BLOOD  Result Value Ref Range Status   Specimen Description BLOOD RIGHT ANTECUBITAL  Final   Special Requests   Final    BOTTLES DRAWN AEROBIC AND ANAEROBIC  Blood Culture adequate volume   Culture   Final  NO GROWTH < 24 HOURS Performed at [facility], [address] [place], [place] [address]    Report Status PENDING  Incomplete  Group A Strep by PCR     Status: None   Collection Time: 10/04/19  6:46 PM   Specimen: Throat; Sterile Swab  Result Value Ref Range Status   Group A Strep by PCR NOT DETECTED NOT DETECTED Final    Comment: Performed at [facility], [address], [place], [place] [address]      Imaging Studies   DG Chest 2 View  Result Date: 10/04/2019 CLINICAL DATA:  Chest pain. EXAM: CHEST - 2 VIEW COMPARISON:  09/24/2019 FINDINGS: The cardiomediastinal contours are normal. Levitz scattered lung scarring. Pulmonary vasculature is normal. No consolidation, pleural effusion, or pneumothorax. No acute osseous abnormalities are seen. IMPRESSION: No acute chest findings. Electronically Signed   By: Narda Rutherford M.D.   On: 10/04/2019 02:08   CT Chest W Contrast  Result Date: 10/04/2019 CLINICAL DATA:  Vomiting. Hematemesis. Chest pain. EXAM: CT CHEST WITH CONTRAST TECHNIQUE: Multidetector CT imaging of the chest was performed during intravenous contrast administration. CONTRAST:  5mL OMNIPAQUE IOHEXOL 300 MG/ML  SOLN COMPARISON:  None. FINDINGS: Cardiovascular: The heart is normal in size. No pericardial effusion. The aorta is normal in caliber. No dissection. No atherosclerotic calcifications. The branch vessels are patent. The pulmonary arteries appear normal. Mediastinum/Nodes: No mediastinal or hilar mass or adenopathy. No hematoma or pneumomediastinum. The esophagus is grossly normal by CT. No findings for esophageal injury. No paraesophageal fluid collections or adenopathy. The GE junction appears normal. Lungs/Pleura: No acute pulmonary findings. No pleural effusion or pneumothorax. No worrisome pulmonary lesions. Upper Abdomen: No significant upper abdominal findings. No free abdominal fluid collections  or free air. Musculoskeletal: No chest wall lesions, supraclavicular or axillary adenopathy. The lower neck is unremarkable. No subcutaneous emphysema. The thyroid gland is unremarkable. The bony thorax is intact. IMPRESSION: 1. Unremarkable CT examination of the chest. No findings for esophageal injury, hematoma or pneumomediastinum. 2. Normal appearance of the heart and great vessels. 3. No acute pulmonary findings or worrisome pulmonary lesions. Electronically Signed   By: Rudie Meyer M.D.   On: 10/04/2019 08:13   DG ESOPHAGUS W SINGLE CM (SOL OR THIN BA)  Result Date: 10/04/2019 CLINICAL DATA:  Vomiting, hematemesis EXAM: ESOPHOGRAM TECHNIQUE: Single contrast examination was performed using  Gastrografin. FLUOROSCOPY TIME:  Fluoroscopy Time:  42 seconds Radiation Exposure Index (if provided by the fluoroscopic device): 14.1 mGy Number of Acquired Spot Images: 4 full exposures COMPARISON:  CT chest 10/04/2019 FINDINGS: Single-contrast esophagram was performed using a 1:1 dilution of Gastrografin and water. The esophageal motility is within normal limits. No extraluminal contrast to suggest esophageal perforation or injury. There is no evidence of stricture, mass or ulceration. Gastroesophageal junction normally located without hiatal hernia. IMPRESSION: Negative for esophageal tear/perforation. Electronically Signed   By: Duanne Guess D.O.   On: 10/04/2019 09:25     Medications   Scheduled Meds: . carvedilol  6.25 mg Oral BID WC  . chlordiazePOXIDE  50 mg Oral TID  . escitalopram  20 mg Oral Daily  . folic acid  1 mg Oral Daily  . LORazepam  0-4 mg Intravenous Q6H   Followed by  . [START ON 10/06/2019] LORazepam  0-4 mg Intravenous Q12H  . multivitamin with minerals  1 tablet Oral Daily  . nicotine  21 mg Transdermal Daily  . pantoprazole (PROTONIX) IV  40 mg Intravenous Q12H  .  sucralfate  1 g Oral TID WC & HS  . thiamine  100 mg Oral Daily   Or  . thiamine  100 mg Intravenous Daily     Continuous Infusions: . sodium chloride 125 mL/hr at 10/05/19 0625       LOS: 1 day    Time spent: 25 minutes    Pennie Banter, DO [facility]   If 7PM-7AM, please contact night-coverage [email or web address] 10/05/2019, 12:39 PM

## 2019-10-05 NOTE — Progress Notes (Signed)
PHARMACIST - PHYSICIAN COMMUNICATION  CONCERNING: IV to Oral Route Change Policy  RECOMMENDATION: This patient is receiving Protonix  by the intravenous route.  Based on criteria approved by the Pharmacy and Therapeutics Committee, the intravenous medication(s) is/are being converted to the equivalent oral dose form(s).  DESCRIPTION: These criteria include:  The patient is eating (either orally or via tube) and/or has been taking other orally administered medications for a least 24 hours  The patient has no evidence of active gastrointestinal bleeding or impaired GI absorption (gastrectomy, short bowel, patient on TNA or NPO).  If you have questions about this conversion, please contact the Pharmacy Department    Gardner Candle, PharmD, BCPS Clinical Pharmacist 10/05/2019 2:09 PM

## 2019-10-06 LAB — GLUCOSE, CAPILLARY: Glucose-Capillary: 105 mg/dL — ABNORMAL HIGH (ref 70–99)

## 2019-10-06 LAB — MAGNESIUM: Magnesium: 2 mg/dL (ref 1.7–2.4)

## 2019-10-06 LAB — COMPREHENSIVE METABOLIC PANEL
ALT: 80 U/L — ABNORMAL HIGH (ref 0–44)
AST: 34 U/L (ref 15–41)
Albumin: 3.4 g/dL — ABNORMAL LOW (ref 3.5–5.0)
Alkaline Phosphatase: 83 U/L (ref 38–126)
Anion gap: 7 (ref 5–15)
BUN: 16 mg/dL (ref 6–20)
CO2: 25 mmol/L (ref 22–32)
Calcium: 8.4 mg/dL — ABNORMAL LOW (ref 8.9–10.3)
Chloride: 106 mmol/L (ref 98–111)
Creatinine, Ser: 0.76 mg/dL (ref 0.61–1.24)
GFR calc Af Amer: >60 mL/min (ref >60)
GFR calc non Af Amer: >60 mL/min (ref >60)
Glucose, Bld: 114 mg/dL — ABNORMAL HIGH (ref 70–99)
Potassium: 3.7 mmol/L (ref 3.5–5.1)
Sodium: 138 mmol/L (ref 135–145)
Total Bilirubin: 1 mg/dL (ref 0.3–1.2)
Total Protein: 6.4 g/dL — ABNORMAL LOW (ref 6.5–8.1)

## 2019-10-06 LAB — CBC
HCT: 36.9 % — ABNORMAL LOW (ref 39.0–52.0)
Hemoglobin: 12.8 g/dL — ABNORMAL LOW (ref 13.0–17.0)
MCH: 29.6 pg (ref 26.0–34.0)
MCHC: 34.7 g/dL (ref 30.0–36.0)
MCV: 85.2 fL (ref 80.0–100.0)
Platelets: 190 10*3/uL (ref 150–400)
RBC: 4.33 MIL/uL (ref 4.22–5.81)
RDW: 12 % (ref 11.5–15.5)
WBC: 4.7 10*3/uL (ref 4.0–10.5)
nRBC: 0 % (ref 0.0–0.2)

## 2019-10-06 MED ORDER — SUCRALFATE 1 GM/10ML PO SUSP: 1.0000 g | Freq: Three times a day (TID) | ORAL | 0 refills | Status: DC

## 2019-10-06 MED ORDER — HYDROXYZINE PAMOATE 50 MG PO CAPS
50.0000 mg | ORAL_CAPSULE | Freq: Four times a day (QID) | ORAL | 0 refills | Status: DC | PRN
Start: 2019-10-06 — End: 2020-01-23

## 2019-10-06 MED ORDER — CHLORDIAZEPOXIDE HCL 25 MG PO CAPS: ORAL_CAPSULE | ORAL | 0 refills | Status: AC

## 2019-10-06 MED ORDER — PANTOPRAZOLE SODIUM 40 MG PO TBEC: 40.0000 mg | DELAYED_RELEASE_TABLET | Freq: Two times a day (BID) | ORAL | 1 refills | Status: DC

## 2019-10-06 MED ORDER — PHENOL 1.4 % MT LIQD: 1.0000 | OROMUCOSAL | 0 refills | Status: DC | PRN

## 2019-10-06 MED ORDER — ADULT MULTIVITAMIN W/MINERALS CH: 1.0000 | ORAL_TABLET | Freq: Every day | ORAL | Status: DC

## 2019-10-06 MED ORDER — ONDANSETRON HCL 4 MG PO TABS: 4.0000 mg | ORAL_TABLET | Freq: Three times a day (TID) | ORAL | 0 refills | Status: DC | PRN

## 2019-10-06 MED ORDER — BUSPIRONE HCL 7.5 MG PO TABS: 7.5000 mg | ORAL_TABLET | Freq: Two times a day (BID) | ORAL | 1 refills | Status: DC

## 2019-10-06 NOTE — Progress Notes (Signed)
Patient states ready for discharge and discharged to home by family transport.  Patient states wants to stop drinking. Resources given, Medications and discharge instructions reviewed with patient who verbalized understanding.  Patient states will start prescribed medications. PIV removed with tip intact. Patient experiencing withdraw symptoms relieved by medication per patient. No needs verbalized at this time.

## 2019-10-06 NOTE — Discharge Summary (Signed)
Physician Discharge Summary  Zachary Anchors Ivery Jr. ZOX:[id number] DOB: [birth year] DOA: 10/04/2019  PCP: [facility]  Admit date: 10/04/2019 Discharge date: 10/06/2019  Admitted From: home Disposition:  home  Recommendations for Outpatient Follow-up:  1. Follow up with PCP in 1-2 weeks 2. Please obtain CMP/CBC in one week 3. Patient was started on twice daily PPI for esophagitis/gastritis.   4. Patient was treated for alcohol withdrawal and discharged with taper of Librium.  Please assess status of patient's alcohol cessation. 5. Please follow up with AA or other sources of support to help with sobriety 6. For his anxiety and panic attacks, patient was started on low dose Buspar.  Please assess if helpful and tolerated, increase dose as appropriate.   Also prescribed Vistaril PRN for acute panic attacks. 7. Patient would benefit from counseling for his anxiety and panic attacks  Home Health: No  Equipment/Devices: None   Discharge Condition: Stable  CODE STATUS: Full  Diet recommendation: Soft, advance as tolerated   Discharge Diagnoses: Principal Problem:   Hematemesis Active Problems:   Severe anxiety with panic   Alcohol abuse   Leukocytosis   Abnormal LFTs   Nausea vomiting and diarrhea   Hypertension   Tobacco abuse   Abdominal pain   Chest pain   Esophagitis    Summary of HPI and Hospital Course:  Zachary Peral. is a 29 y.o. male with medical history significant of hypertension, GERD, anxiety, panic attack, alcohol dependence, tobacco abuse, who presented to the ED on 10/04/19 with nausea, vomiting with hematemesis, diarrhea and epigastric/chest pain after reportedly drinking heavily for past several days after having been sober for about one month.  In the ED, mildly tachycardic and hypertensive but otherwise vitals stable. Labs notable for leukocytosis 14.2k, normal troponin x2, lipase 22, serum Etoh 212.  Chest x-ray negative.  CT of the chest with  contrast is negative.  Esophagram is negative for perforation or tear.  Admitted to hospitalist service with GI consulted.    Patient now in alcohol withdrawal, treating with Librium and PRN Ativan per [facility] protocol.    Uncomplicated Alcohol Withdrawal Alcohol Dependence - patient reports drinking about 2 pints liquor daily, had been sober about a month, then drank heavily for 5 days prior to admission.  He says he is determined to stay off alcohol this time and reports he has a good social support network to help him maintain sobriety.  No history of withdrawal seizures or full DT's.  Has had hallucinations once during prior withdrawal.  Treated with prn Ativan per [facility] protocol and started on Librium taper.   --Librium taper prescribed on discharge, 3 days --patient counseled on importance of alcohol cessation, encouraged to reach out to AA or other resources for assistance.  He declined offer to have SW give him information.  Hematemesis:Hgb stable on admission. CT chest is negative foresophageal perforation. Esophagramisnegative for perforation or tear.Hemoglobin has remained stable.  GI consulted and patient underwent EGD on 4/22 which showed LA Grade D reflux esophagitis without bleeding, erythematous gastric mucosa from vomiting, normal duodenum.  Patient started on oral PPI twice daily.  No further episodes of hematemesis. --soft diet, advance as tolerated  --PO pantoprazole40 bid  --Zofran IV for nausea --Avoid NSAIDs and anticoagulants  Severe anxiety with panic attacks - currently exacerbated by acute illness and alcohol withdrawal.  Continue home Lexapro.  Vistaril PRN prescribed on d/c for acute panic attacks.   Tobacco abuse - nicotine patch.  Counseled  patient on importance of smoking cessation for his long term health.  Nausea & vomiting,diarrheaandabdominal pain:due toalcoholic gastritis andesophagitis.Lipase normal.Etiology for diarrhea is not clear, C diff  and GI panel negative. PRN Zofran.  IV fluids as above.  Leukocytosis:WBC 14.2. No fever, possibly reactive leukocytosis in setting of vomiting and bleeding. Pt has sore throat, due to vomiting most likely.  Strep PCR negative.  --Chloroseptic spray --follow blood cultures  Abnormal LFTs:ALP 73, AST 89, ALT 167, total bilirubin 1.0, likely due to alcohol abuse.  Improving.  PCP recheck in 4-6 weeks.  HTN: Continue home Coreg.  Used PRN clonidine as well during admission and Etoh withdrawal related elevated BP's.  Chest pain:Patient described his chest pain is located in the midline of his chest, which is likely due to alcoholic esophagitis.Troponin negative twice 5 -->5.   Obesity: Body mass index is 33.67 kg/m. This complicates patient's overall prognosis.   Discharge Instructions   Discharge Instructions    Diet - low sodium heart healthy   Complete by: As directed    Discharge instructions   Complete by: As directed    Please take Librium (aka chlordiazepoxide) for the next 3 days, tapering down. Strongly encourage that you completely stop drinking alcohol.   Please seek out local resources if you need assistance in staying sober - AA meetings are a good source of supportive people.   Increase activity slowly   Complete by: As directed      Allergies as of 10/06/2019      Reactions   Penicillins Hives      Medication List    TAKE these medications   busPIRone 7.5 MG tablet Commonly known as: BUSPAR Take 1 tablet (7.5 mg total) by mouth 2 (two) times daily.   carvedilol 6.25 MG tablet Commonly known as: COREG Take 1 tablet (6.25 mg total) by mouth 2 (two) times daily with a meal.   chlordiazePOXIDE 25 MG capsule Commonly known as: LIBRIUM Take 1 capsule (25 mg total) by mouth 3 (three) times daily for 1 day, THEN 1 capsule (25 mg total) 2 (two) times daily for 1 day, THEN 1 capsule (25 mg total) daily for 1 day. Start taking on: October 06, 2019    escitalopram 20 MG tablet Commonly known as: LEXAPRO Take 20 mg by mouth daily.   hydrOXYzine 50 MG capsule Commonly known as: Vistaril Take 1 capsule (50 mg total) by mouth every 6 (six) hours as needed (anxiety/panic attack).   multivitamin with minerals Tabs tablet Take 1 tablet by mouth daily. Start taking on: October 07, 2019   ondansetron 4 MG tablet Commonly known as: Zofran Take 1 tablet (4 mg total) by mouth every 8 (eight) hours as needed for nausea or vomiting.   pantoprazole 40 MG tablet Commonly known as: PROTONIX Take 1 tablet (40 mg total) by mouth 2 (two) times daily.   phenol 1.4 % Liqd Commonly known as: CHLORASEPTIC Use as directed 1 spray in the mouth or throat as needed for throat irritation / pain.   sucralfate 1 GM/10ML suspension Commonly known as: CARAFATE Take 10 mLs (1 g total) by mouth 4 (four) times daily -  with meals and at bedtime.       Allergies  Allergen Reactions  . Penicillins Hives    Consultations:  Gastroenterology    Procedures/Studies: DG Chest 2 View  Result Date: 10/04/2019 CLINICAL DATA:  Chest pain. EXAM: CHEST - 2 VIEW COMPARISON:  09/24/2019 FINDINGS: The cardiomediastinal contours are normal. Cashman  scattered lung scarring. Pulmonary vasculature is normal. No consolidation, pleural effusion, or pneumothorax. No acute osseous abnormalities are seen. IMPRESSION: No acute chest findings. Electronically Signed   By: Narda Rutherford M.D.   On: 10/04/2019 02:08   DG Chest 2 View  Result Date: 09/24/2019 CLINICAL DATA:  Lambert Mody left-sided chest pain radiating to left arm, tobacco abuse EXAM: CHEST - 2 VIEW COMPARISON:  07/15/2019 FINDINGS: Frontal and lateral views of the chest demonstrates stable background scarring consistent with tobacco abuse. No airspace disease, effusion, or pneumothorax. Cardiac silhouette is stable. No acute bony abnormalities. IMPRESSION: 1. Stable exam, no acute process. Electronically Signed   By:  Sharlet Salina M.D.   On: 09/24/2019 16:03   CT Chest W Contrast  Result Date: 10/04/2019 CLINICAL DATA:  Vomiting. Hematemesis. Chest pain. EXAM: CT CHEST WITH CONTRAST TECHNIQUE: Multidetector CT imaging of the chest was performed during intravenous contrast administration. CONTRAST:  75mL OMNIPAQUE IOHEXOL 300 MG/ML  SOLN COMPARISON:  None. FINDINGS: Cardiovascular: The heart is normal in size. No pericardial effusion. The aorta is normal in caliber. No dissection. No atherosclerotic calcifications. The branch vessels are patent. The pulmonary arteries appear normal. Mediastinum/Nodes: No mediastinal or hilar mass or adenopathy. No hematoma or pneumomediastinum. The esophagus is grossly normal by CT. No findings for esophageal injury. No paraesophageal fluid collections or adenopathy. The GE junction appears normal. Lungs/Pleura: No acute pulmonary findings. No pleural effusion or pneumothorax. No worrisome pulmonary lesions. Upper Abdomen: No significant upper abdominal findings. No free abdominal fluid collections or free air. Musculoskeletal: No chest wall lesions, supraclavicular or axillary adenopathy. The lower neck is unremarkable. No subcutaneous emphysema. The thyroid gland is unremarkable. The bony thorax is intact. IMPRESSION: 1. Unremarkable CT examination of the chest. No findings for esophageal injury, hematoma or pneumomediastinum. 2. Normal appearance of the heart and great vessels. 3. No acute pulmonary findings or worrisome pulmonary lesions. Electronically Signed   By: Rudie Meyer M.D.   On: 10/04/2019 08:13   DG ESOPHAGUS W SINGLE CM (SOL OR THIN BA)  Result Date: 10/04/2019 CLINICAL DATA:  Vomiting, hematemesis EXAM: ESOPHOGRAM TECHNIQUE: Single contrast examination was performed using  Gastrografin. FLUOROSCOPY TIME:  Fluoroscopy Time:  42 seconds Radiation Exposure Index (if provided by the fluoroscopic device): 14.1 mGy Number of Acquired Spot Images: 4 full exposures COMPARISON:   CT chest 10/04/2019 FINDINGS: Single-contrast esophagram was performed using a 1:1 dilution of Gastrografin and water. The esophageal motility is within normal limits. No extraluminal contrast to suggest esophageal perforation or injury. There is no evidence of stricture, mass or ulceration. Gastroesophageal junction normally located without hiatal hernia. IMPRESSION: Negative for esophageal tear/perforation. Electronically Signed   By: Duanne Guess D.O.   On: 10/04/2019 09:25     EGD 4/22  Subjective: Patient reports feeling better.  Throat still hurts since EGD.  He denies N/V.  Has little epigastric discomfort this AM.  Tolerating diet.  No tremors.  Does still report exacerbated anxiety and feeling panicky.  Agreeable to discharge with Librium taper.  States he will seek help from AA or elsewhere if he needs it.   Discharge Exam: Vitals:   10/06/19 0937 10/06/19 1158  BP: (!) 127/97 (!) 138/103  Pulse: 80 93  Resp: 20   Temp:  98 F (36.7 C)  SpO2: 100% 97%   Vitals:   10/05/19 1937 10/06/19 0427 10/06/19 0937 10/06/19 1158  BP: (!) 137/95 119/81 (!) 127/97 (!) 138/103  Pulse: 93 72 80 93  Resp: 20 16  20   Temp: 98.5 F (36.9 C) 98.3 F (36.8 C)  98 F (36.7 C)  TempSrc: Oral Oral  Oral  SpO2: 97% 98% 100% 97%  Weight:      Height:        General: Pt is alert, awake, not in acute distress Cardiovascular: RRR, S1/S2 +, no rubs, no gallops Respiratory: CTA bilaterally, no wheezing, no rhonchi Abdominal: Soft, NT, ND, bowel sounds + Extremities: no edema, no cyanosis    The results of significant diagnostics from this hospitalization (including imaging, microbiology, ancillary and laboratory) are listed below for reference.     Microbiology: Recent Results (from the past 240 hour(s))  Respiratory Panel by RT PCR (Flu A&B, Covid) - Nasopharyngeal Swab     Status: None   Collection Time: 10/04/19  9:37 AM   Specimen: Nasopharyngeal Swab  Result Value Ref Range  Status   SARS Coronavirus 2 by RT PCR NEGATIVE NEGATIVE Final    Comment: (NOTE) SARS-CoV-2 target nucleic acids are NOT DETECTED. The SARS-CoV-2 RNA is generally detectable in upper respiratoy specimens during the acute phase of infection. The lowest concentration of SARS-CoV-2 viral copies this assay can detect is 131 copies/mL. A negative result does not preclude SARS-Cov-2 infection and should not be used as the sole basis for treatment or other patient management decisions. A negative result may occur with  improper specimen collection/handling, submission of specimen other than nasopharyngeal swab, presence of viral mutation(s) within the areas targeted by this assay, and inadequate number of viral copies (<131 copies/mL). A negative result must be combined with clinical observations, patient history, and epidemiological information. The expected result is Negative. Fact Sheet for Patients:  https://www.moore.com/ Fact Sheet for Healthcare Providers:  https://www.young.biz/ This test is not yet ap proved or cleared by the [place] FDA and  has been authorized for detection and/or diagnosis of SARS-CoV-2 by FDA under an Emergency Use Authorization (EUA). This EUA will remain  in effect (meaning this test can be used) for the duration of the COVID-19 declaration under Section 564(b)(1) of the Act, 21 U.S.C. section 360bbb-3(b)(1), unless the authorization is terminated or revoked sooner.    Influenza A by PCR NEGATIVE NEGATIVE Final   Influenza B by PCR NEGATIVE NEGATIVE Final    Comment: (NOTE) The Xpert Xpress SARS-CoV-2/FLU/RSV assay is intended as an aid in  the diagnosis of influenza from Nasopharyngeal swab specimens and  should not be used as a sole basis for treatment. Nasal washings and  aspirates are unacceptable for Xpert Xpress SARS-CoV-2/FLU/RSV  testing. Fact Sheet for  Patients: https://www.moore.com/ Fact Sheet for Healthcare Providers: https://www.young.biz/ This test is not yet approved or cleared by the [place] FDA and  has been authorized for detection and/or diagnosis of SARS-CoV-2 by  FDA under an Emergency Use Authorization (EUA). This EUA will remain  in effect (meaning this test can be used) for the duration of the  Covid-19 declaration under Section 564(b)(1) of the Act, 21  U.S.C. section 360bbb-3(b)(1), unless the authorization is  terminated or revoked. Performed at [facility], [address] [place], [place] [address]   C Difficile Quick Screen w PCR reflex     Status: None   Collection Time: 10/04/19 11:55 AM   Specimen: STOOL  Result Value Ref Range Status   C Diff antigen NEGATIVE NEGATIVE Final   C Diff toxin NEGATIVE NEGATIVE Final   C Diff interpretation No C. difficile detected.  Final    Comment: Performed at [facility], [address] [place], [place] [address]  Gastrointestinal Panel by PCR , Stool     Status: None   Collection Time: 10/04/19 11:55 AM   Specimen: STOOL  Result Value Ref Range Status   Campylobacter species NOT DETECTED NOT DETECTED Final   Plesimonas shigelloides NOT DETECTED NOT DETECTED Final   Salmonella species NOT DETECTED NOT DETECTED Final   Yersinia enterocolitica NOT DETECTED NOT DETECTED Final   Vibrio species NOT DETECTED NOT DETECTED Final   Vibrio cholerae NOT DETECTED NOT DETECTED Final   Enteroaggregative E coli (EAEC) NOT DETECTED NOT DETECTED Final   Enteropathogenic E coli (EPEC) NOT DETECTED NOT DETECTED Final   Enterotoxigenic E coli (ETEC) NOT DETECTED NOT DETECTED Final   Shiga like toxin producing E coli (STEC) NOT DETECTED NOT DETECTED Final   Shigella/Enteroinvasive E coli (EIEC) NOT DETECTED NOT DETECTED Final   Cryptosporidium NOT DETECTED NOT DETECTED Final   Cyclospora cayetanensis NOT DETECTED NOT  DETECTED Final   Entamoeba histolytica NOT DETECTED NOT DETECTED Final   Giardia lamblia NOT DETECTED NOT DETECTED Final   Adenovirus F40/41 NOT DETECTED NOT DETECTED Final   Astrovirus NOT DETECTED NOT DETECTED Final   Norovirus GI/GII NOT DETECTED NOT DETECTED Final   Rotavirus A NOT DETECTED NOT DETECTED Final   Sapovirus (I, II, IV, and V) NOT DETECTED NOT DETECTED Final    Comment: Performed at [facility], [address] [place], [place] [address]  Culture, blood (Routine X 2) w Reflex to ID Panel     Status: None (Preliminary result)   Collection Time: 10/04/19  4:12 PM   Specimen: BLOOD  Result Value Ref Range Status   Specimen Description BLOOD LEFT ANTECUBITAL  Final   Special Requests   Final    BOTTLES DRAWN AEROBIC AND ANAEROBIC Blood Culture adequate volume   Culture   Final    NO GROWTH 2 DAYS Performed at [facility], [address] [place], [place] [address]    Report Status PENDING  Incomplete  Culture, blood (Routine X 2) w Reflex to ID Panel     Status: None (Preliminary result)   Collection Time: 10/04/19  4:12 PM   Specimen: BLOOD  Result Value Ref Range Status   Specimen Description BLOOD RIGHT ANTECUBITAL  Final   Special Requests   Final    BOTTLES DRAWN AEROBIC AND ANAEROBIC Blood Culture adequate volume   Culture   Final    NO GROWTH 2 DAYS Performed at [facility], [address] [place], [place] [address]    Report Status PENDING  Incomplete  Group A Strep by PCR     Status: None   Collection Time: 10/04/19  6:46 PM   Specimen: Throat; Sterile Swab  Result Value Ref Range Status   Group A Strep by PCR NOT DETECTED NOT DETECTED Final    Comment: Performed at [facility], [address] [place], [place] [address]     Labs: BNP (last 3 results) No results for input(s): BNP in the last 8760 hours. Basic Metabolic Panel: Recent Labs  Lab 10/04/19 0105 10/04/19 0937 10/05/19 0250 10/06/19 0314   NA 141 141 142 138  K 3.6 3.6 3.7 3.7  CL 100 100 106 106  CO2 27 27 26 25   GLUCOSE 174* 154* 112* 114*  BUN 21* 25* 25* 16  CREATININE 0.77 0.78 0.85 0.76  CALCIUM 9.0 8.2* 8.4* 8.4*  MG  --   --  2.2 2.0  Liver Function Tests: Recent Labs  Lab 10/04/19 0105 10/04/19 0937 10/05/19 0250 10/06/19 0314  AST 89* 60* 51* 34  ALT 167* 137* 105* 80*  ALKPHOS 73 69 73 83  BILITOT 1.0 0.8 1.3* 1.0  PROT 7.6 7.1 5.8* 6.4*  ALBUMIN 4.4 4.0 3.4* 3.4*   Recent Labs  Lab 10/04/19 0753  LIPASE 22   No results for input(s): AMMONIA in the last 168 hours. CBC: Recent Labs  Lab 10/04/19 0105 10/04/19 0753 10/05/19 0250 10/05/19 0916 10/05/19 1453 10/05/19 2101 10/06/19 0314  WBC 8.2   < > 9.2 7.2 7.4 6.2 4.7  NEUTROABS 5.5  --   --   --   --   --   --   HGB 16.2   < > 12.5* 12.4* 12.9* 12.9* 12.8*  HCT 47.5   < > 37.2* 36.0* 37.5* 37.4* 36.9*  MCV 85.6   < > 87.1 86.7 86.2 85.6 85.2  PLT 380   < > 210 202 196 195 190   < > = values in this interval not displayed.   Cardiac Enzymes: No results for input(s): CKTOTAL, CKMB, CKMBINDEX, TROPONINI in the last 168 hours. BNP: Invalid input(s): POCBNP CBG: Recent Labs  Lab 10/06/19 0747  GLUCAP 105*   D-Dimer No results for input(s): DDIMER in the last 72 hours. Hgb A1c No results for input(s): HGBA1C in the last 72 hours. Lipid Profile No results for input(s): CHOL, HDL, LDLCALC, TRIG, CHOLHDL, LDLDIRECT in the last 72 hours. Thyroid function studies No results for input(s): TSH, T4TOTAL, T3FREE, THYROIDAB in the last 72 hours.  Invalid input(s): FREET3 Anemia work up No results for input(s): VITAMINB12, FOLATE, FERRITIN, TIBC, IRON, RETICCTPCT in the last 72 hours. Urinalysis    Component Value Date/Time   COLORURINE YELLOW (A) 03/28/2019 1442   APPEARANCEUR CLEAR (A) 03/28/2019 1442   APPEARANCEUR Cloudy 09/09/2013 2112   LABSPEC 1.012 03/28/2019 1442   LABSPEC 1.014 09/09/2013 2112   PHURINE 6.0 03/28/2019  1442   GLUCOSEU NEGATIVE 03/28/2019 1442   GLUCOSEU Negative 09/09/2013 2112   HGBUR NEGATIVE 03/28/2019 1442   BILIRUBINUR NEGATIVE 03/28/2019 1442   BILIRUBINUR Negative 09/09/2013 2112   KETONESUR NEGATIVE 03/28/2019 1442   PROTEINUR NEGATIVE 03/28/2019 1442   NITRITE NEGATIVE 03/28/2019 1442   LEUKOCYTESUR NEGATIVE 03/28/2019 1442   LEUKOCYTESUR Negative 09/09/2013 2112   Sepsis Labs Invalid input(s): PROCALCITONIN,  WBC,  LACTICIDVEN Microbiology Recent Results (from the past 240 hour(s))  Respiratory Panel by RT PCR (Flu A&B, Covid) - Nasopharyngeal Swab     Status: None   Collection Time: 10/04/19  9:37 AM   Specimen: Nasopharyngeal Swab  Result Value Ref Range Status   SARS Coronavirus 2 by RT PCR NEGATIVE NEGATIVE Final    Comment: (NOTE) SARS-CoV-2 target nucleic acids are NOT DETECTED. The SARS-CoV-2 RNA is generally detectable in upper respiratoy specimens during the acute phase of infection. The lowest concentration of SARS-CoV-2 viral copies this assay can detect is 131 copies/mL. A negative result does not preclude SARS-Cov-2 infection and should not be used as the sole basis for treatment or other patient management decisions. A negative result may occur with  improper specimen collection/handling, submission of specimen other than nasopharyngeal swab, presence of viral mutation(s) within the areas targeted by this assay, and inadequate number of viral copies (<131 copies/mL). A negative result must be combined with clinical observations, patient history, and epidemiological information. The expected result is Negative. Fact Sheet for Patients:  PinkCheek.be Fact Sheet for Healthcare  Providers:  https://www.young.biz/ This test is not yet ap proved or cleared by the [place] and  has been authorized for detection and/or diagnosis of SARS-CoV-2 by FDA under an Emergency Use Authorization (EUA). This EUA  will remain  in effect (meaning this test can be used) for the duration of the COVID-19 declaration under Section 564(b)(1) of the Act, 21 U.S.C. section 360bbb-3(b)(1), unless the authorization is terminated or revoked sooner.    Influenza A by PCR NEGATIVE NEGATIVE Final   Influenza B by PCR NEGATIVE NEGATIVE Final    Comment: (NOTE) The Xpert Xpress SARS-CoV-2/FLU/RSV assay is intended as an aid in  the diagnosis of influenza from Nasopharyngeal swab specimens and  should not be used as a sole basis for treatment. Nasal washings and  aspirates are unacceptable for Xpert Xpress SARS-CoV-2/FLU/RSV  testing. Fact Sheet for Patients: https://www.moore.com/ Fact Sheet for Healthcare Providers: https://www.young.biz/ This test is not yet approved or cleared by the [place] FDA and  has been authorized for detection and/or diagnosis of SARS-CoV-2 by  FDA under an Emergency Use Authorization (EUA). This EUA will remain  in effect (meaning this test can be used) for the duration of the  Covid-19 declaration under Section 564(b)(1) of the Act, 21  U.S.C. section 360bbb-3(b)(1), unless the authorization is  terminated or revoked. Performed at [facility], [address] [place], [place] [address]   C Difficile Quick Screen w PCR reflex     Status: None   Collection Time: 10/04/19 11:55 AM   Specimen: STOOL  Result Value Ref Range Status   C Diff antigen NEGATIVE NEGATIVE Final   C Diff toxin NEGATIVE NEGATIVE Final   C Diff interpretation No C. difficile detected.  Final    Comment: Performed at [facility], [address] [place], [place] [address]  Gastrointestinal Panel by PCR , Stool     Status: None   Collection Time: 10/04/19 11:55 AM   Specimen: STOOL  Result Value Ref Range Status   Campylobacter species NOT DETECTED NOT DETECTED Final   Plesimonas shigelloides NOT DETECTED NOT DETECTED Final   Salmonella  species NOT DETECTED NOT DETECTED Final   Yersinia enterocolitica NOT DETECTED NOT DETECTED Final   Vibrio species NOT DETECTED NOT DETECTED Final   Vibrio cholerae NOT DETECTED NOT DETECTED Final   Enteroaggregative E coli (EAEC) NOT DETECTED NOT DETECTED Final   Enteropathogenic E coli (EPEC) NOT DETECTED NOT DETECTED Final   Enterotoxigenic E coli (ETEC) NOT DETECTED NOT DETECTED Final   Shiga like toxin producing E coli (STEC) NOT DETECTED NOT DETECTED Final   Shigella/Enteroinvasive E coli (EIEC) NOT DETECTED NOT DETECTED Final   Cryptosporidium NOT DETECTED NOT DETECTED Final   Cyclospora cayetanensis NOT DETECTED NOT DETECTED Final   Entamoeba histolytica NOT DETECTED NOT DETECTED Final   Giardia lamblia NOT DETECTED NOT DETECTED Final   Adenovirus F40/41 NOT DETECTED NOT DETECTED Final   Astrovirus NOT DETECTED NOT DETECTED Final   Norovirus GI/GII NOT DETECTED NOT DETECTED Final   Rotavirus A NOT DETECTED NOT DETECTED Final   Sapovirus (I, II, IV, and V) NOT DETECTED NOT DETECTED Final    Comment: Performed at [facility], [address] [place], [place] [address]  Culture, blood (Routine X 2) w Reflex to ID Panel     Status: None (Preliminary result)   Collection Time: 10/04/19  4:12 PM   Specimen: BLOOD  Result Value Ref Range Status   Specimen Description BLOOD LEFT  ANTECUBITAL  Final   Special Requests   Final    BOTTLES DRAWN AEROBIC AND ANAEROBIC Blood Culture adequate volume   Culture   Final    NO GROWTH 2 DAYS Performed at [facility], [address] [place], [place] [address]    Report Status PENDING  Incomplete  Culture, blood (Routine X 2) w Reflex to ID Panel     Status: None (Preliminary result)   Collection Time: 10/04/19  4:12 PM   Specimen: BLOOD  Result Value Ref Range Status   Specimen Description BLOOD RIGHT ANTECUBITAL  Final   Special Requests   Final    BOTTLES DRAWN AEROBIC AND ANAEROBIC Blood Culture adequate volume    Culture   Final    NO GROWTH 2 DAYS Performed at [facility], [address] [place], [place] [address]    Report Status PENDING  Incomplete  Group A Strep by PCR     Status: None   Collection Time: 10/04/19  6:46 PM   Specimen: Throat; Sterile Swab  Result Value Ref Range Status   Group A Strep by PCR NOT DETECTED NOT DETECTED Final    Comment: Performed at [facility], [address] [place], [place] [address]     Time coordinating discharge: Over 30 minutes  SIGNED:   Pennie Banter, DO [facility] 10/06/2019, 11:59 AM   If 7PM-7AM, please contact night-coverage [email or web address]

## 2019-10-06 NOTE — Discharge Instructions (Signed)
Alcohol Abuse and Dependence Information, Adult Alcohol is a widely available drug. People drink alcohol in different amounts. People who drink alcohol very often and in large amounts often have problems during and after drinking. They may develop what is called an alcohol use disorder. There are two main types of alcohol use disorders:  Alcohol abuse. This is when you use alcohol too much or too often. You may use alcohol to make yourself feel happy or to reduce stress. You may have a hard time setting a limit on the amount you drink.  Alcohol dependence. This is when you use alcohol consistently for a period of time, and your body changes as a result. This can make it hard to stop drinking because you may start to feel sick or feel different when you do not use alcohol. These symptoms are known as withdrawal. How can alcohol abuse and dependence affect me? Alcohol abuse and dependence can have a negative effect on your life. Drinking too much can lead to addiction. You may feel like you need alcohol to function normally. You may drink alcohol before work in the morning, during the day, or as soon as you get home from work in the evening. These actions can result in:  Poor work performance.  Job loss.  Financial problems.  Car crashes or criminal charges from driving after drinking alcohol.  Problems in your relationships with friends and family.  Losing the trust and respect of coworkers, friends, and family. Drinking heavily over a long period of time can permanently damage your body and brain, and can cause lifelong health issues, such as:  Damage to your liver or pancreas.  Heart problems, high blood pressure, or stroke.  Certain cancers.  Decreased ability to fight infections.  Brain or nerve damage.  Depression.  Early (premature) death. If you are careless or you crave alcohol, it is easy to drink more than your body can handle (overdose). Alcohol overdose is a serious  situation that requires hospitalization. It may lead to permanent injuries or death. What can increase my risk?  Having a family history of alcohol abuse.  Having depression or other mental health conditions.  Beginning to drink at an early age.  Binge drinking often.  Experiencing trauma, stress, and an unstable home life during childhood.  Spending time with people who drink often. What actions can I take to prevent or manage alcohol abuse and dependence?  Do not drink alcohol if: ? Your health care provider tells you not to drink. ? You are pregnant, may be pregnant, or are planning to become pregnant.  If you drink alcohol: ? Limit how much you use to:  0-1 drink a day for women.  0-2 drinks a day for men. ? Be aware of how much alcohol is in your drink. In the [place] one drink equals one 12 oz bottle of beer (355 mL), one 5 oz glass of wine (148 mL), or one 1 oz glass of hard liquor (44 mL).  Stop drinking if you have been drinking too much. This can be very hard to do if you are used to abusing alcohol. If you begin to have withdrawal symptoms, talk with your health care provider or a person that you trust. These symptoms may include anxiety, shaky hands, headache, nausea, sweating, or not being able to sleep.  Choose to drink nonalcoholic beverages in social gatherings and places where there may be alcohol. Activity  Spend more time on activities that you enjoy that do   not involve alcohol, like hobbies or exercise.  Find healthy ways to cope with stress, such as exercise, meditation, or spending time with people you care about. General information  Talk to your family, coworkers, and friends about supporting you in your efforts to stop drinking. If they drink, ask them not to drink around you. Spend more time with people who do not drink alcohol.  If you think that you have an alcohol dependency problem: ? Tell friends or family about your concerns. ? Talk with your  health care provider or another health professional about where to get help. ? Work with a therapist and a chemical dependency counselor. ? Consider joining a support group for people who struggle with alcohol abuse and dependence. Where to find support   Your health care provider.  SMART Recovery: www.smartrecovery.org Therapy and support groups  Local treatment centers or chemical dependency counselors.  Local AA groups in your community: www.aa.org Where to find more information  Centers for Disease Control and Prevention: www.cdc.gov  National Institute on Alcohol Abuse and Alcoholism: www.niaaa.nih.gov  Alcoholics Anonymous (AA): www.aa.org Contact a health care provider if:  You drank more or for longer than you intended on more than one occasion.  You tried to stop drinking or to cut back on how much you drink, but you were not able to.  You often drink to the point of vomiting or passing out.  You want to drink so badly that you cannot think about anything else.  You have problems in your life due to drinking, but you continue to drink.  You keep drinking even though you feel anxious, depressed, or have experienced memory loss.  You have stopped doing the things you used to enjoy in order to drink.  You have to drink more than you used to in order to get the effect you want.  You experience anxiety, sweating, nausea, shakiness, and trouble sleeping when you try to stop drinking. Get help right away if:  You have thoughts about hurting yourself or others.  You have serious withdrawal symptoms, including: ? Confusion. ? Racing heart. ? High blood pressure. ? Fever. If you ever feel like you may hurt yourself or others, or have thoughts about taking your own life, get help right away. You can go to your nearest emergency department or call:  Your local emergency services (911 in the [place]  A suicide crisis helpline, such as the National Suicide Prevention  Lifeline at 1-800-273-8255. This is open 24 hours a day. Summary  Alcohol abuse and dependence can have a negative effect on your life. Drinking too much or too often can lead to addiction.  If you drink alcohol, limit how much you use.  If you are having trouble keeping your drinking under control, find ways to change your behavior. Hobbies, calming activities, exercise, or support groups can help.  If you feel you need help with changing your drinking habits, talk with your health care provider, a good friend, or a therapist, or go to an AA group. This information is not intended to replace advice given to you by your health care provider. Make sure you discuss any questions you have with your health care provider. Document Revised: 09/19/2018 Document Reviewed: 08/08/2018 Elsevier Patient Education  2020 Elsevier Inc.  

## 2019-10-09 LAB — CULTURE, BLOOD (ROUTINE X 2)
Culture: NO GROWTH
Culture: NO GROWTH
Special Requests: ADEQUATE
Special Requests: ADEQUATE

## 2019-10-29 DIAGNOSIS — F411 Generalized anxiety disorder: Secondary | ICD-10-CM | POA: Insufficient documentation

## 2019-10-29 DIAGNOSIS — F410 Panic disorder [episodic paroxysmal anxiety] without agoraphobia: Secondary | ICD-10-CM | POA: Insufficient documentation

## 2019-12-23 ENCOUNTER — Encounter: Payer: Self-pay | Admitting: Emergency Medicine

## 2019-12-23 ENCOUNTER — Emergency Department: Payer: Self-pay

## 2019-12-23 ENCOUNTER — Emergency Department
Admission: EM | Admit: 2019-12-23 | Discharge: 2019-12-25 | Disposition: A | Discharge status: Home / Self Care | Payer: Self-pay | Attending: Emergency Medicine | Admitting: Emergency Medicine

## 2019-12-23 ENCOUNTER — Other Ambulatory Visit: Payer: Self-pay

## 2019-12-23 DIAGNOSIS — F1010 Alcohol abuse, uncomplicated: Secondary | ICD-10-CM

## 2019-12-23 DIAGNOSIS — Z638 Other specified problems related to primary support group: Secondary | ICD-10-CM | POA: Insufficient documentation

## 2019-12-23 DIAGNOSIS — F10929 Alcohol use, unspecified with intoxication, unspecified: Secondary | ICD-10-CM

## 2019-12-23 DIAGNOSIS — R079 Chest pain, unspecified: Secondary | ICD-10-CM

## 2019-12-23 DIAGNOSIS — F411 Generalized anxiety disorder: Secondary | ICD-10-CM | POA: Insufficient documentation

## 2019-12-23 DIAGNOSIS — Y906 Blood alcohol level of 120-199 mg/100 ml: Secondary | ICD-10-CM | POA: Insufficient documentation

## 2019-12-23 DIAGNOSIS — F10239 Alcohol dependence with withdrawal, unspecified: Secondary | ICD-10-CM | POA: Insufficient documentation

## 2019-12-23 DIAGNOSIS — R1013 Epigastric pain: Secondary | ICD-10-CM | POA: Insufficient documentation

## 2019-12-23 DIAGNOSIS — Z20822 Contact with and (suspected) exposure to covid-19: Secondary | ICD-10-CM | POA: Insufficient documentation

## 2019-12-23 DIAGNOSIS — F320 Major depressive disorder, single episode, mild: Secondary | ICD-10-CM | POA: Insufficient documentation

## 2019-12-23 DIAGNOSIS — I10 Essential (primary) hypertension: Secondary | ICD-10-CM | POA: Insufficient documentation

## 2019-12-23 DIAGNOSIS — F17210 Nicotine dependence, cigarettes, uncomplicated: Secondary | ICD-10-CM | POA: Insufficient documentation

## 2019-12-23 DIAGNOSIS — Z79899 Other long term (current) drug therapy: Secondary | ICD-10-CM | POA: Insufficient documentation

## 2019-12-23 LAB — SARS CORONAVIRUS 2 BY RT PCR (HOSPITAL ORDER, PERFORMED IN ~~LOC~~ HOSPITAL LAB): SARS Coronavirus 2: NEGATIVE

## 2019-12-23 LAB — CBC WITH DIFFERENTIAL/PLATELET
Abs Immature Granulocytes: 0.01 10*3/uL (ref 0.00–0.07)
Basophils Absolute: 0 10*3/uL (ref 0.0–0.1)
Basophils Relative: 1 %
Eosinophils Absolute: 0 10*3/uL (ref 0.0–0.5)
Eosinophils Relative: 1 %
HCT: 47.5 % (ref 39.0–52.0)
Hemoglobin: 16.5 g/dL (ref 13.0–17.0)
Immature Granulocytes: 0 %
Lymphocytes Relative: 38 %
Lymphs Abs: 2.1 10*3/uL (ref 0.7–4.0)
MCH: 28.6 pg (ref 26.0–34.0)
MCHC: 34.7 g/dL (ref 30.0–36.0)
MCV: 82.5 fL (ref 80.0–100.0)
Monocytes Absolute: 0.8 10*3/uL (ref 0.1–1.0)
Monocytes Relative: 14 %
Neutro Abs: 2.6 10*3/uL (ref 1.7–7.7)
Neutrophils Relative %: 46 %
Platelets: 359 10*3/uL (ref 150–400)
RBC: 5.76 MIL/uL (ref 4.22–5.81)
RDW: 12.6 % (ref 11.5–15.5)
WBC: 5.6 10*3/uL (ref 4.0–10.5)
nRBC: 0 % (ref 0.0–0.2)

## 2019-12-23 LAB — HEPATIC FUNCTION PANEL
ALT: 133 U/L — ABNORMAL HIGH (ref 0–44)
AST: 88 U/L — ABNORMAL HIGH (ref 15–41)
Albumin: 4.6 g/dL (ref 3.5–5.0)
Alkaline Phosphatase: 78 U/L (ref 38–126)
Bilirubin, Direct: 0.2 mg/dL (ref 0.0–0.2)
Indirect Bilirubin: 0.7 mg/dL (ref 0.3–0.9)
Total Bilirubin: 0.9 mg/dL (ref 0.3–1.2)
Total Protein: 7.7 g/dL (ref 6.5–8.1)

## 2019-12-23 LAB — URINE DRUG SCREEN, QUALITATIVE (ARMC ONLY)
Amphetamines, Ur Screen: NOT DETECTED
Barbiturates, Ur Screen: NOT DETECTED
Benzodiazepine, Ur Scrn: POSITIVE — AB
Cannabinoid 50 Ng, Ur ~~LOC~~: NOT DETECTED
Cocaine Metabolite,Ur ~~LOC~~: NOT DETECTED
MDMA (Ecstasy)Ur Screen: NOT DETECTED
Methadone Scn, Ur: NOT DETECTED
Opiate, Ur Screen: POSITIVE — AB
Phencyclidine (PCP) Ur S: NOT DETECTED
Tricyclic, Ur Screen: NOT DETECTED

## 2019-12-23 LAB — BASIC METABOLIC PANEL
Anion gap: 17 — ABNORMAL HIGH (ref 5–15)
BUN: 14 mg/dL (ref 6–20)
CO2: 28 mmol/L (ref 22–32)
Calcium: 9.4 mg/dL (ref 8.9–10.3)
Chloride: 95 mmol/L — ABNORMAL LOW (ref 98–111)
Creatinine, Ser: 0.94 mg/dL (ref 0.61–1.24)
GFR calc Af Amer: >60 mL/min (ref >60)
GFR calc non Af Amer: >60 mL/min (ref >60)
Glucose, Bld: 156 mg/dL — ABNORMAL HIGH (ref 70–99)
Potassium: 3.8 mmol/L (ref 3.5–5.1)
Sodium: 140 mmol/L (ref 135–145)

## 2019-12-23 LAB — TROPONIN I (HIGH SENSITIVITY)
Troponin I (High Sensitivity): 5 ng/L (ref <18)
Troponin I (High Sensitivity): 6 ng/L (ref <18)

## 2019-12-23 LAB — ETHANOL: Alcohol, Ethyl (B): 191 mg/dL — ABNORMAL HIGH (ref <10)

## 2019-12-23 LAB — LIPASE, BLOOD: Lipase: 52 U/L — ABNORMAL HIGH (ref 11–51)

## 2019-12-23 MED ORDER — THIAMINE HCL 100 MG/ML IJ SOLN: 100.0000 mg | Freq: Every day | INTRAMUSCULAR | Status: DC

## 2019-12-23 MED ORDER — LORAZEPAM 2 MG PO TABS
0.0000 mg | ORAL_TABLET | Freq: Two times a day (BID) | ORAL | Status: DC
  Administered 2019-12-25: 2 mg via ORAL
  Filled 2019-12-23: qty 1

## 2019-12-23 MED ORDER — LORAZEPAM 2 MG PO TABS
0.0000 mg | ORAL_TABLET | Freq: Four times a day (QID) | ORAL | Status: AC
  Administered 2019-12-23: 1 mg via ORAL
  Administered 2019-12-23 – 2019-12-25 (×6): 2 mg via ORAL
  Filled 2019-12-23 (×7): qty 1

## 2019-12-23 MED ORDER — LORAZEPAM 2 MG/ML IJ SOLN
1.0000 mg | Freq: Once | INTRAMUSCULAR | Status: AC
  Administered 2019-12-23: 1 mg via INTRAVENOUS
  Filled 2019-12-23: qty 1

## 2019-12-23 MED ORDER — LORAZEPAM 2 MG/ML IJ SOLN: 0.0000 mg | Freq: Two times a day (BID) | INTRAMUSCULAR | Status: DC

## 2019-12-23 MED ORDER — ALUM & MAG HYDROXIDE-SIMETH 200-200-20 MG/5ML PO SUSP
30.0000 mL | Freq: Once | ORAL | Status: AC
  Administered 2019-12-23: 30 mL via ORAL
  Filled 2019-12-23: qty 30

## 2019-12-23 MED ORDER — SODIUM CHLORIDE 0.9 % IV BOLUS
1000.0000 mL | Freq: Once | INTRAVENOUS | Status: AC
  Administered 2019-12-23: 1000 mL via INTRAVENOUS

## 2019-12-23 MED ORDER — ONDANSETRON 8 MG PO TBDP
8.0000 mg | ORAL_TABLET | Freq: Once | ORAL | Status: AC
  Administered 2019-12-23: 8 mg via ORAL
  Filled 2019-12-23: qty 2
  Filled 2019-12-23: qty 1

## 2019-12-23 MED ORDER — THIAMINE HCL 100 MG PO TABS
100.0000 mg | ORAL_TABLET | Freq: Every day | ORAL | Status: DC
  Administered 2019-12-23 – 2019-12-25 (×3): 100 mg via ORAL
  Filled 2019-12-23 (×3): qty 1

## 2019-12-23 MED ORDER — LORAZEPAM 2 MG/ML IJ SOLN
0.0000 mg | Freq: Four times a day (QID) | INTRAMUSCULAR | Status: AC
  Administered 2019-12-23: 2 mg via INTRAVENOUS
  Filled 2019-12-23: qty 1

## 2019-12-23 MED ORDER — ONDANSETRON 4 MG PO TBDP
4.0000 mg | ORAL_TABLET | Freq: Once | ORAL | Status: AC | PRN
  Administered 2019-12-23: 4 mg via ORAL
  Filled 2019-12-23: qty 1

## 2019-12-23 NOTE — ED Notes (Signed)
Pt refused snack at 2030

## 2019-12-23 NOTE — BH Assessment (Signed)
Patient has been accepted to [facility] Patient assigned to: Main Campus ([place] location)  Accepting physician is Dr. Loyola Mast Call report to [phone] Representative was Aqua.    ER Staff is aware of it:  Glenda, ER Bradley Ferris, ER MD  Dewayne Hatch Patient's Nurse     Patient's Family/Support System (Wife [name]), [phone]) have been updated as well.   PATIENT IS SCHEDULED FOR ADMISSION ANYTIME AFTER 8AM TOMORROW

## 2019-12-23 NOTE — ED Triage Notes (Signed)
Pt arrived via [facility], drinking 4 Loko x 5 days, not eating x 5 days, c/o chest pain, wants detox, etoh on board drank 4 24 oz 4 lokos tonight.  158/108 BP, unknown if taking meds.  CBG = 161

## 2019-12-23 NOTE — ED Triage Notes (Signed)
Pt arrived via [facility] with reports of wanting help with detox from alcohol. Pt requesting Ativan on arrival to help him with his panic attacks.    Pt is a binge drinking, currently drank the last 5 days. Pt drinks 4 Four Loko drinks daily, last drank tonight 4 Four Lokos.  Pt c/o his stomach being "raw" and he keeps having panic attacks really bad.  Pt states that he wants to get clean for his wife and 4 kids.  Pt takes carvedilol at home, states he took dose earlier today.

## 2019-12-23 NOTE — ED Notes (Signed)
Pt requesting something for nausea and something to help with the stomach pain.  Offered Zofran, advised patient that anything else he will have to wait to see the MD.

## 2019-12-23 NOTE — ED Notes (Signed)
TTS at bedside. 

## 2019-12-23 NOTE — BH Assessment (Signed)
Assessment Note  Zachary Mendoza Hageman. is a 29 y.o. male presents to [facility] ED voluntarily for treatment. Per triage note, Pt arrived via [facility] with reports of wanting help with detox from alcohol. Pt requesting Ativan on arrival to help him with his panic attacks. Pt is a binge drinking, currently drank the last 5 days. Pt drinks 4 Four Loco drinks daily, last drank tonight 4 Four Loco's. Pt c/o his stomach being "raw" and he keeps having panic attacks really bad. Pt states that he wants to get clean for his wife and 4 kids. Pt takes carvedilol at home, states he took dose earlier today.  During TTS assessment pt presents alert and oriented x 4, depressed but cooperative, and mood-congruent with affect. The pt does not appear to be responding to internal or external stimuli. Neither is the pt presenting with any delusional thinking. Pt confirmed the information provided to the triage RN. Pt reports drinking since he was 29 years old and a 37-month sobriety period 2 months ago. Pt denied any triggers to his relapse and stated "I guess the thought of thinking one would be ok". Pt reports drinking locos all day for the last 5 days with nothing else to eat or drink. Pt identified feeling tired, body pains, shakes and vomiting as his current withdrawal symptoms. Pt denies using any other substance outside of alcohol and a family hx of MH/SA. Pt denies a MH, INPT or OPT hx. Pt reports struggles eating stating "I haven't eaten in 5 days" and identified a lack of appetite as his primary reason for not eating. Pt reports an increase in his sleep stating, "I sleep 8 or sometimes more hours a day, all I do is drink and sleep". Pt expressed the need for detox and some struggles with depression. In attempt to assess Pt's depression triggers pt stated "I mean I don't know just a lot of life stuff" without details. Pt denies any current SI/HI/AH/VH and provided his wife Zachary Mendoza [phone]).   Collateral contact with  wife: Zachary Mendoza confirmed her relationship to the pt and identified her primary concerns to be his drinking. Zachary Mendoza stated "he can go 1 month without drinking but after his first sip it's like he can't stop". Zachary Mendoza denies pt using any other substances or safety concerns. Zachary Mendoza denies pt to have an INPT/OPT hx or to be aware of pt's current reason for admission. Zachary Mendoza reports concerns with pt possibly being depressed (Loss mother 5 years ago, currently separated due to drinking), alcohol use and abuse of prescription drugs (Ativan). Zachary Mendoza reports pt attempt to seek Ativan from former doctor before the prescription was due to be filled. Zachary Mendoza expressed no other concerns and agreed to be available by phone as needed.   Diagnosis: Alcohol withdrawal    Past Medical History:  Past Medical History:  Diagnosis Date  . Hypertension   . Panic attacks     Past Surgical History:  Procedure Laterality Date  . ESOPHAGOGASTRODUODENOSCOPY (EGD) WITH PROPOFOL N/A 10/04/2019   Procedure: ESOPHAGOGASTRODUODENOSCOPY (EGD) WITH PROPOFOL;  Surgeon: Midge Minium, MD;  Location: [facility] ENDOSCOPY;  Service: Endoscopy;  Laterality: N/A;    Family History:  Family History  Problem Relation Age of Onset  . Hypertension Mother     Social History:  reports that he has been smoking cigarettes. He has been smoking about 1.00 pack per day. He has never used smokeless tobacco. He reports current alcohol use. He reports previous drug use.  Additional Social History:  Alcohol / Drug Use Pain Medications: see mar Prescriptions: see mar Over the Counter: see mar History of alcohol / drug use?: Yes Substance #1 Name of Substance 1: alcohol  CIWA: CIWA-Ar BP: 129/84 Pulse Rate: (!) 110 Nausea and Vomiting: no nausea and no vomiting Tactile Disturbances: none Tremor: two Auditory Disturbances: not present Paroxysmal Sweats: no sweat visible Visual Disturbances: moderate sensitivity Anxiety: moderately  anxious, or guarded, so anxiety is inferred Headache, Fullness in Head: moderate Agitation: normal activity Orientation and Clouding of Sensorium: oriented and can do serial additions CIWA-Ar Total: 12 COWS:    Allergies:  Allergies  Allergen Reactions  . Penicillins Hives    Pt states he is not allergic, but that his sister was.    Home Medications: (Not in a hospital admission)   OB/GYN Status:  No LMP for male patient.  General Assessment Data Location of Assessment: [facility] ED TTS Assessment: In system Is this a Tele or Face-to-Face Assessment?: Face-to-Face Is this an Initial Assessment or a Re-assessment for this encounter?: Initial Assessment Patient Accompanied by:: N/A Language Other than English: No Living Arrangements: Other (Comment) (Private home ) What gender do you identify as?: Male Marital status: Married Edmund name:  (unknown ) Pregnancy Status: No Living Arrangements: Spouse/significant other Can pt return to current living arrangement?: Yes Admission Status: Voluntary Is patient capable of signing voluntary admission?: Yes Referral Source: Self/Family/Friend Insurance type: None   Medical Screening Exam [facility] Walk-in ONLY) Medical Exam completed: Yes  Crisis Care Plan Living Arrangements: Spouse/significant other Legal Guardian:  (Self) Name of Psychiatrist: None reported  Name of Therapist: None reported   Education Status Is patient currently in school?: No Is the patient employed, unemployed or receiving disability?: Unemployed  Risk to self with the past 6 months Suicidal Ideation: No Has patient been a risk to self within the past 6 months prior to admission? : No Suicidal Intent: No Has patient had any suicidal intent within the past 6 months prior to admission? : No Is patient at risk for suicide?: No, but patient needs Medical Clearance Suicidal Plan?: No Has patient had any suicidal plan within the past 6 months prior to admission? :  No Access to Means: No What has been your use of drugs/alcohol within the last 12 months?: Alcohol  Previous Attempts/Gestures: No How many times?: 0 Other Self Harm Risks: None reported  Triggers for Past Attempts: None known Intentional Self Injurious Behavior: None Family Suicide History: No Recent stressful life event(s): Loss (Comment) (Mom 5 years ago ) Persecutory voices/beliefs?: No Depression: Yes Depression Symptoms: Despondent Substance abuse history and/or treatment for substance abuse?: No Suicide prevention information given to non-admitted patients: Not applicable  Risk to Others within the past 6 months Homicidal Ideation: No Does patient have any lifetime risk of violence toward others beyond the six months prior to admission? : No Thoughts of Harm to Others: No Current Homicidal Intent: No Current Homicidal Plan: No Access to Homicidal Means: No Identified Victim: n/a History of harm to others?: No Assessment of Violence: None Noted Violent Behavior Description: n/a Does patient have access to weapons?: No Criminal Charges Pending?: No Does patient have a court date: No Is patient on probation?: No  Psychosis Hallucinations: None noted Delusions: None noted  Mental Status Report Appearance/Hygiene: Unremarkable Eye Contact: Fair Motor Activity: Unremarkable Speech: Logical/coherent Level of Consciousness: Alert Mood: Depressed, Pleasant Affect: Depressed Anxiety Level: None Thought Processes: Coherent, Relevant Judgement: Partial Orientation: Appropriate for developmental age Obsessive Compulsive Thoughts/Behaviors: None  Cognitive Functioning Concentration: Fair Memory: Recent Intact, Remote Intact Is patient IDD: No Insight: Fair Impulse Control: Good Appetite: Poor Have you had any weight changes? : No Change Sleep: Increased Total Hours of Sleep: 8 (or more ) Vegetative Symptoms: None     Prior Inpatient Therapy Prior Inpatient  Therapy: No  Prior Outpatient Therapy Prior Outpatient Therapy: No Does patient have an ACCT team?: No Does patient have Intensive In-House Services?  : No Does patient have [facility] services? : Unknown Does patient have P4CC services?: Unknown  ADL Screening (condition at time of admission) Is the patient deaf or have difficulty hearing?: No Does the patient have difficulty seeing, even when wearing glasses/contacts?: No Does the patient have difficulty concentrating, remembering, or making decisions?: No Does the patient have difficulty dressing or bathing?: No Does the patient have difficulty walking or climbing stairs?: No Weakness of Legs: None Weakness of Arms/Hands: None  Home Assistive Devices/Equipment Home Assistive Devices/Equipment: None  Therapy Consults (therapy consults require a physician order) PT Evaluation Needed: No OT Evalulation Needed: No SLP Evaluation Needed: No Abuse/Neglect Assessment (Assessment to be complete while patient is alone) Abuse/Neglect Assessment Can Be Completed: Yes Physical Abuse: Denies Verbal Abuse: Denies Sexual Abuse: Denies Exploitation of patient/patient's resources: Denies Self-Neglect: Denies Values / Beliefs Cultural Requests During Hospitalization: None Spiritual Requests During Hospitalization: None Consults Spiritual Care Consult Needed: No Transition of Care Team Consult Needed: No Advance Directives (For Healthcare) Does Patient Have a Medical Advance Directive?: No Would patient like information on creating a medical advance directive?: No - Patient declined          Disposition:  Detox Disposition Initial Assessment Completed for this Encounter: Yes Patient referred to: Other (Comment)  On Site Evaluation by:   Reviewed with Physician:    Opal Sidles 12/23/2019 2:38 PM

## 2019-12-23 NOTE — BH Assessment (Signed)
Referral information for detox treatment faxed to:   [facility] [phone])   [facility] [phone])  . [facility] [phone])  . [facility] [phone])

## 2019-12-23 NOTE — ED Notes (Signed)
Call light answered pt reports "ativan not working" and "cold"; reports no relief to nausea  Pt oriented to effects of withdrawal and effectiveness of ativan  Pt given additional blanket and EDP notified

## 2019-12-23 NOTE — ED Provider Notes (Signed)
[facility] Emergency Department Provider Note  ____________________________________________   First MD Initiated Contact with Patient 12/23/19 [phone]     (approximate)  I have reviewed the triage vital signs and the nursing notes.   HISTORY  Chief Complaint Alcohol Problem and Chest Pain    HPI Zachary L Canales Montez Hageman. is a 29 y.o. male below list of previous medical conditions including hypertension alcohol abuse panic attacks with previous alcohol withdrawal presents to the emergency department requesting detox.  Patient states that he has been "binge drinking" for the past 5 days.  Patient states that he drinks 4 bottles of 4 local beers per day.  Patient states that he also feels very anxious as though he is having a panic attack and is requesting Ativan stating that this is what helps his panic attacks.  Patient denies any suicidal or homicidal ideation.  Patient requesting alcohol detox at this time        Past Medical History:  Diagnosis Date  . Hypertension   . Panic attacks     Patient Active Problem List   Diagnosis Date Noted  . Leukocytosis 10/04/2019  . Abnormal LFTs 10/04/2019  . Hematemesis 10/04/2019  . Nausea vomiting and diarrhea 10/04/2019  . Hypertension   . Tobacco abuse   . Abdominal pain   . Chest pain   . Esophagitis   . Alcohol abuse 03/29/2019  . Severe anxiety with panic 03/28/2019  . Alcoholic intoxication with complication [facility])     Past Surgical History:  Procedure Laterality Date  . ESOPHAGOGASTRODUODENOSCOPY (EGD) WITH PROPOFOL N/A 10/04/2019   Procedure: ESOPHAGOGASTRODUODENOSCOPY (EGD) WITH PROPOFOL;  Surgeon: Midge Minium, MD;  Location: [facility] ENDOSCOPY;  Service: Endoscopy;  Laterality: N/A;    Prior to Admission medications   Medication Sig Start Date End Date Taking? Authorizing Provider  busPIRone (BUSPAR) 7.5 MG tablet Take 1 tablet (7.5 mg total) by mouth 2 (two) times daily. 10/06/19   Pennie Banter,  DO  carvedilol (COREG) 6.25 MG tablet Take 1 tablet (6.25 mg total) by mouth 2 (two) times daily with a meal. 03/30/19   Clapacs, Jackquline Denmark, MD  escitalopram (LEXAPRO) 20 MG tablet Take 20 mg by mouth daily. 09/21/19   [provider]  hydrOXYzine (VISTARIL) 50 MG capsule Take 1 capsule (50 mg total) by mouth every 6 (six) hours as needed (anxiety/panic attack). 10/06/19   Pennie Banter, DO  Multiple Vitamin (MULTIVITAMIN WITH MINERALS) TABS tablet Take 1 tablet by mouth daily. 10/07/19   Pennie Banter, DO  ondansetron (ZOFRAN) 4 MG tablet Take 1 tablet (4 mg total) by mouth every 8 (eight) hours as needed for nausea or vomiting. 10/06/19 10/05/20  Esaw Grandchild A, DO  pantoprazole (PROTONIX) 40 MG tablet Take 1 tablet (40 mg total) by mouth 2 (two) times daily. 10/06/19   Esaw Grandchild A, DO  phenol (CHLORASEPTIC) 1.4 % LIQD Use as directed 1 spray in the mouth or throat as needed for throat irritation / pain. 10/06/19   Pennie Banter, DO  sucralfate (CARAFATE) 1 GM/10ML suspension Take 10 mLs (1 g total) by mouth 4 (four) times daily -  with meals and at bedtime. 10/06/19   Pennie Banter, DO    Allergies Penicillins  Family History  Problem Relation Age of Onset  . Hypertension Mother     Social History Social History   Tobacco Use  . Smoking status: Current Every Day Smoker    Packs/day: 1.00  Types: Cigarettes  . Smokeless tobacco: Never Used  Vaping Use  . Vaping Use: Never used  Substance Use Topics  . Alcohol use: Yes    Comment: 2-3 pints per day  . Drug use: Not Currently    Comment: heroin multiple times a week    Review of Systems Constitutional: No fever/chills Eyes: No visual changes. ENT: No sore throat. Cardiovascular: Denies chest pain. Respiratory: Denies shortness of breath. Gastrointestinal: No abdominal pain.  No nausea, no vomiting.  No diarrhea.  No constipation. Genitourinary: Negative for dysuria. Musculoskeletal: Negative for  neck pain.  Negative for back pain. Integumentary: Negative for rash. Neurological: Negative for headaches, focal weakness or numbness. Psychiatric:  Positive for alcohol abuse   ____________________________________________   PHYSICAL EXAM:  VITAL SIGNS: ED Triage Vitals  Enc Vitals Group     BP 12/23/19 0411 (!) 151/111     Pulse Rate 12/23/19 0411 (!) 114     Resp 12/23/19 0411 18     Temp 12/23/19 0426 98.3 F (36.8 C)     Temp Source 12/23/19 0411 Oral     SpO2 12/23/19 0411 97 %     Weight 12/23/19 0412 104.3 kg (230 lb)     Height 12/23/19 0412 1.803 m (5\' 11" )     Head Circumference --      Peak Flow --      Pain Score 12/23/19 0421 5     Pain Loc --      Pain Edu? --      Excl. in GC? --     Constitutional: Alert and oriented.  Appears intoxicated Eyes: Conjunctivae are normal.  Head: Atraumatic. Mouth/Throat: Tongue fasciculations. Neck: No stridor.  No meningeal signs.   Cardiovascular: Normal rate, regular rhythm. Good peripheral circulation. Grossly normal heart sounds. Respiratory: Normal respiratory effort.  No retractions. Gastrointestinal: Epigastric tenderness to palpation. No distention.  Musculoskeletal: No lower extremity tenderness nor edema. No gross deformities of extremities. Neurologic:  Normal speech and language. No gross focal neurologic deficits are appreciated.  Skin:  Skin is warm, dry and intact. Psychiatric: Mood and affect are normal. Speech and behavior are normal.  ____________________________________________   LABS (all labs ordered are listed, but only abnormal results are displayed)  Labs Reviewed  BASIC METABOLIC PANEL - Abnormal; Notable for the following components:      Result Value   Chloride 95 (*)    Glucose, Bld 156 (*)    Anion gap 17 (*)    All other components within normal limits  ETHANOL - Abnormal; Notable for the following components:   Alcohol, Ethyl (B) 191 (*)    All other components within normal  limits  HEPATIC FUNCTION PANEL - Abnormal; Notable for the following components:   AST 88 (*)    ALT 133 (*)    All other components within normal limits  LIPASE, BLOOD - Abnormal; Notable for the following components:   Lipase 52 (*)    All other components within normal limits  CBC WITH DIFFERENTIAL/PLATELET  URINE DRUG SCREEN, QUALITATIVE ([facility] ONLY)  TROPONIN I (HIGH SENSITIVITY)  TROPONIN I (HIGH SENSITIVITY)     RADIOLOGY I, Gering N Jeremyah Jelley, personally viewed and evaluated these images (plain radiographs) as part of my medical decision making, as well as reviewing the written report by the radiologist.  ED MD interpretation:    Official radiology report(s): DG Chest Port 1 View  Result Date: 12/23/2019 CLINICAL DATA:  Chest pain EXAM: PORTABLE CHEST 1 VIEW COMPARISON:  None. FINDINGS: The heart size and mediastinal contours are within normal limits. Both lungs are clear. The visualized skeletal structures are unremarkable. IMPRESSION: No active disease. Electronically Signed   By: Deatra Robinson M.D.   On: 12/23/2019 05:39    ____________________________________________    Procedures   ____________________________________________   INITIAL IMPRESSION / MDM / ASSESSMENT AND PLAN / ED COURSE  As part of my medical decision making, I reviewed the following data within the electronic MEDICAL RECORD NUMBER   29 year old male presented with above-stated history and physical exam secondary to alcohol abuse with suspected acute alcohol intoxication which was confirmed with EtOH lab data 191.  Patient also admits to feelings of anxiety and with tongue fasciculation and as such concern for possible alcohol withdrawal.  Patient was given 1 mg of Ativan IV.  On reevaluation patient still admits to feelings of anxiety and as such an additional 1 mg of IV Ativan was given.  Awaiting TTS consultation for detox placement.  ____________________________________________  FINAL CLINICAL  IMPRESSION(S) / ED DIAGNOSES  Final diagnoses:  Alcoholic intoxication with complication (HCC)  Alcohol abuse     MEDICATIONS GIVEN DURING THIS VISIT:  Medications  LORazepam (ATIVAN) injection 1 mg (has no administration in time range)  ondansetron (ZOFRAN-ODT) disintegrating tablet 4 mg (4 mg Oral Given 12/23/19 0428)  LORazepam (ATIVAN) injection 1 mg (1 mg Intravenous Given 12/23/19 0533)  alum & mag hydroxide-simeth (MAALOX/MYLANTA) 200-200-20 MG/5ML suspension 30 mL (30 mLs Oral Given 12/23/19 0532)  sodium chloride 0.9 % bolus 1,000 mL (1,000 mLs Intravenous New Bag/Given 12/23/19 0532)     ED Discharge Orders    None      *Please note:  Zachary L Dozier Jr. was evaluated in Emergency Department on 12/23/2019 for the symptoms described in the history of present illness. He was evaluated in the context of the global COVID-19 pandemic, which necessitated consideration that the patient might be at risk for infection with the SARS-CoV-2 virus that causes COVID-19. Institutional protocols and algorithms that pertain to the evaluation of patients at risk for COVID-19 are in a state of rapid change based on information released by regulatory bodies including the CDC and federal and state organizations. These policies and algorithms were followed during the patient's care in the ED.  Some ED evaluations and interventions may be delayed as a result of limited staffing during and after the pandemic.*  Note:  This document was prepared using Dragon voice recognition software and may include unintentional dictation errors.   Darci Current, MD 12/23/19 0630

## 2019-12-23 NOTE — ED Notes (Signed)
Pt called out saying, "I need Ativan." Explained that it wasn't time for his next dose. Pt states, "ok."

## 2019-12-23 NOTE — ED Notes (Signed)
Pt dressed into blue scrubs by this tech and Musician. Pt walked to [facility] and belongings placed in storage room.

## 2019-12-23 NOTE — ED Notes (Signed)
Report received from [place], [place]. Pt is currently sleeping at this time in ED stretcher. ED stretcher locked and in lowest position. Pt is alert and arousable

## 2019-12-23 NOTE — ED Notes (Signed)
Pt requesting detox from ETOH, reports panic attacks with chest tightness: pt requests fluids, GI cocktail and ativan  Pt reports last ETOH approx 3 hours ago and N V for 3 days  Last detox at [facility] [place] in June and approx one week ago relapsed with bing drinking

## 2019-12-24 MED ORDER — CARVEDILOL 6.25 MG PO TABS
6.2500 mg | ORAL_TABLET | Freq: Two times a day (BID) | ORAL | Status: DC
  Administered 2019-12-24 – 2019-12-25 (×4): 6.25 mg via ORAL
  Filled 2019-12-24 (×5): qty 1

## 2019-12-24 MED ORDER — ESCITALOPRAM OXALATE 10 MG PO TABS
20.0000 mg | ORAL_TABLET | Freq: Every day | ORAL | Status: DC
  Administered 2019-12-24 – 2019-12-25 (×2): 20 mg via ORAL
  Filled 2019-12-24 (×2): qty 2

## 2019-12-24 MED ORDER — BUSPIRONE HCL 15 MG PO TABS
7.5000 mg | ORAL_TABLET | Freq: Two times a day (BID) | ORAL | Status: DC
  Administered 2019-12-24 – 2019-12-25 (×3): 7.5 mg via ORAL
  Filled 2019-12-24 (×4): qty 1

## 2019-12-24 MED ORDER — ALUM & MAG HYDROXIDE-SIMETH 200-200-20 MG/5ML PO SUSP
30.0000 mL | Freq: Four times a day (QID) | ORAL | Status: DC | PRN
  Administered 2019-12-24 (×2): 30 mL via ORAL
  Filled 2019-12-24 (×2): qty 30

## 2019-12-24 MED ORDER — PANTOPRAZOLE SODIUM 40 MG PO TBEC
40.0000 mg | DELAYED_RELEASE_TABLET | Freq: Two times a day (BID) | ORAL | Status: DC
  Administered 2019-12-24 – 2019-12-25 (×3): 40 mg via ORAL
  Filled 2019-12-24 (×3): qty 1

## 2019-12-24 NOTE — ED Notes (Signed)
Meal tray given 

## 2019-12-24 NOTE — ED Notes (Signed)
Gave report to [place], Charity fundraiser in [place]. Pt to be transferred this am for detox.

## 2019-12-24 NOTE — ED Notes (Signed)
SAFE  TRANSPORT  CALLED  FOR  TRANSPORT  TO  [facility]   

## 2019-12-24 NOTE — ED Notes (Addendum)
Pt states he does not want to go to [place]. EDP Dr Erma Heritage notified and states to let TTS know before pt is discharged. Attempted to call TTS, no answer, will try again later.   TTS came and spoke with pt, [facility] TTS states pt will have to be discharged and she will let the doctor know.

## 2019-12-24 NOTE — ED Provider Notes (Signed)
Emergency Medicine Observation Re-evaluation Note  Zachary Mendoza. is a 29 y.o. male, seen on rounds today.  Pt initially presented to the ED for complaints of Alcohol Problem and Chest Pain Currently, the patient is calm, resting. He is now requesting to d/c rather than go to rehab.  Physical Exam  BP (!) 160/116 (BP Location: Right Arm)   Pulse 100   Temp 98.5 F (36.9 C) (Oral)   Resp 18   Ht 5\' 11"  (1.803 m)   Wt 104.3 kg   SpO2 98%   BMI 32.08 kg/m  Physical Exam  Gen: Calm, in NAD, resting in psych scrubs HEENT: NCAT CV: Well perfused Resp: Normal work of breathing, no apparent resp distress Neuro: Non-focal, no apparent deficits, at mental baseline Psych: Calm, resting  ED Course / MDM  EKG:EKG Interpretation  Date/Time:  Sunday December 23 2019 04:33:30 EDT Ventricular Rate:  108 PR Interval:  140 QRS Duration: 92 QT Interval:  342 QTC Calculation: 458 R Axis:   64 Text Interpretation: Sinus tachycardia Possible Left atrial enlargement Borderline ECG Confirmed by UNCONFIRMED, DOCTOR (06-26-1980), editor [id number], Tammy [phone]) on 12/24/2019 8:23:09 AM    I have reviewed the labs performed to date as well as medications administered while in observation.  Recent changes in the last 24 hours include: none. Plan  Current plan is for admission to outpt detox . Patient is not under full IVC at this time.   02/24/2020, MD 12/24/19 [phone]

## 2019-12-24 NOTE — ED Notes (Signed)
SAFE  TRANSPORT  [facility] PER  NURSE  DOROTHY  INFORMED  [facility] TTS

## 2019-12-24 NOTE — ED Notes (Addendum)
Pt received snack tray and obtained vitals. Pt asked for something for his stomach as it was  "sour feeling". Tech reported to RN and RN said she would see to it at medicine time.   lw edt

## 2019-12-24 NOTE — ED Notes (Signed)
Pt denies SI/HI/AVH on assessment 

## 2019-12-24 NOTE — ED Notes (Signed)
Provided pt with linen, new scrubs, and shower supplies.

## 2019-12-24 NOTE — ED Notes (Signed)
Psych MD talkiing with pt

## 2019-12-24 NOTE — ED Notes (Signed)
VOL  PENDING  PLACEMENT 

## 2019-12-25 MED ORDER — LORAZEPAM 1 MG PO TABS
1.0000 mg | ORAL_TABLET | Freq: Once | ORAL | Status: AC
  Administered 2019-12-25: 1 mg via ORAL
  Filled 2019-12-25: qty 1

## 2019-12-25 MED ORDER — LOPERAMIDE HCL 2 MG PO CAPS
4.0000 mg | ORAL_CAPSULE | ORAL | Status: DC | PRN
  Administered 2019-12-25: 4 mg via ORAL
  Filled 2019-12-25: qty 2

## 2019-12-25 MED ORDER — CHLORDIAZEPOXIDE HCL 25 MG PO CAPS: ORAL_CAPSULE | ORAL | 0 refills | Status: AC

## 2019-12-25 NOTE — BH Assessment (Signed)
TTS completed reassessment. Pt presented calm, pleasant and oriented x 3. Pt reports to feel ready to go home. During attempt to assess his plan upon discharge pt stated "No I am not seeking any services I'm just gonna go home". Pt denies any current SI/HIAH/VH and contracted for safety.  Final disposition is pending psych reassessment 

## 2019-12-25 NOTE — Final Progress Note (Signed)
Physician Final Progress Note  Patient ID: Ivan Anchors Lute Jr. MRN: [id number] DOB/AGE: 08-14-1990 29 y.o.  Admit date: 12/23/2019 Admitting provider: No admitting provider for patient encounter. Discharge date: 12/25/2019   Admission Diagnoses:  ETOH dependence intoxication withdrawal Major depression  Generalized anxiety  Marital discord   Discharge Diagnoses:   Same   Consults: TTS / MD Psych ER MD  Significant Findings/ Diagnostic Studies: none  Procedures:  None   Discharge Condition: stable but should stay longer for detox and or inpatient admission and rehab  Wants to go home  Voluntary status   Disposition:  home by cab   Diet: Normal   Discharge Activity: {--  He should have --gone to [facility] or similar inpatient, rehab time day treatment, IOP marital counseling parenting AA NA related programming ----   He wants to go home and not go to rehab or programming.  Wife left with three kids  And temp living with others due to his drinking and issues.   No active SI or HI or plans  Mood relatively stable  Not clouded or fluctuant Somewhat anxious and depressed No frank shakes and tremors  Fund of knowledge and intelligence below average Concentration and attention okay Consciousness not clouded or fluctuant       Total time spent taking care of this patient:  Minutes   25-30 minutes   Signed: Roselind Messier 12/25/2019, 2:33 PM

## 2019-12-25 NOTE — ED Provider Notes (Signed)
-----------------------------------------   5:24 PM on 12/25/2019 -----------------------------------------  Patient is now requesting to be discharged home, no longer interested in inpatient detox.  He has been cleared by psychiatry and is currently voluntary.  We will discharge him home with prescription for Librium for alcohol detox as well as referral for outpatient substance abuse resources.   Chesley Noon, MD 12/25/19 1725

## 2019-12-25 NOTE — ED Notes (Signed)
Pt asking for ativan. Explained to pt that he did not have orders for ativan at this time but would ask the doctor if he could get a one time dose. Pt is extremely anxious but cooperative.

## 2019-12-25 NOTE — ED Notes (Signed)
Pt has been up to the bathroom several times and when asked reports he has diarrhea. [place], NP informed and new orders placed.

## 2019-12-25 NOTE — ED Notes (Signed)
Pt discharging home. Discharge teaching and prescription reviewed with pt and he verbalized understanding. Pt signed paper discharge form. Given all his personal belongings and taxi voucher. Escorted to lobby, ambulatory and in NAD.

## 2019-12-31 ENCOUNTER — Emergency Department: Payer: Self-pay

## 2019-12-31 ENCOUNTER — Other Ambulatory Visit: Payer: Self-pay

## 2019-12-31 ENCOUNTER — Observation Stay
Admission: EM | Admit: 2019-12-31 | Discharge: 2020-01-01 | Disposition: A | Discharge status: Home / Self Care | Payer: Self-pay | Attending: Emergency Medicine | Admitting: Emergency Medicine

## 2019-12-31 DIAGNOSIS — Z79899 Other long term (current) drug therapy: Secondary | ICD-10-CM | POA: Insufficient documentation

## 2019-12-31 DIAGNOSIS — Y939 Activity, unspecified: Secondary | ICD-10-CM | POA: Insufficient documentation

## 2019-12-31 DIAGNOSIS — Y999 Unspecified external cause status: Secondary | ICD-10-CM | POA: Insufficient documentation

## 2019-12-31 DIAGNOSIS — Z20822 Contact with and (suspected) exposure to covid-19: Secondary | ICD-10-CM | POA: Insufficient documentation

## 2019-12-31 DIAGNOSIS — I10 Essential (primary) hypertension: Secondary | ICD-10-CM | POA: Insufficient documentation

## 2019-12-31 DIAGNOSIS — F17210 Nicotine dependence, cigarettes, uncomplicated: Secondary | ICD-10-CM | POA: Insufficient documentation

## 2019-12-31 DIAGNOSIS — Y92009 Unspecified place in unspecified non-institutional (private) residence as the place of occurrence of the external cause: Secondary | ICD-10-CM | POA: Insufficient documentation

## 2019-12-31 DIAGNOSIS — R519 Headache, unspecified: Principal | ICD-10-CM | POA: Insufficient documentation

## 2019-12-31 DIAGNOSIS — W108XXA Fall (on) (from) other stairs and steps, initial encounter: Secondary | ICD-10-CM | POA: Insufficient documentation

## 2019-12-31 LAB — GLUCOSE, CSF: Glucose, CSF: 67 mg/dL (ref 40–70)

## 2019-12-31 LAB — BASIC METABOLIC PANEL
Anion gap: 12 (ref 5–15)
BUN: 11 mg/dL (ref 6–20)
CO2: 24 mmol/L (ref 22–32)
Calcium: 9.3 mg/dL (ref 8.9–10.3)
Chloride: 100 mmol/L (ref 98–111)
Creatinine, Ser: 0.79 mg/dL (ref 0.61–1.24)
GFR calc Af Amer: >60 mL/min (ref >60)
GFR calc non Af Amer: >60 mL/min (ref >60)
Glucose, Bld: 107 mg/dL — ABNORMAL HIGH (ref 70–99)
Potassium: 3.9 mmol/L (ref 3.5–5.1)
Sodium: 136 mmol/L (ref 135–145)

## 2019-12-31 LAB — CBC
HCT: 43.1 % (ref 39.0–52.0)
Hemoglobin: 14.5 g/dL (ref 13.0–17.0)
MCH: 28.9 pg (ref 26.0–34.0)
MCHC: 33.6 g/dL (ref 30.0–36.0)
MCV: 85.9 fL (ref 80.0–100.0)
Platelets: 257 10*3/uL (ref 150–400)
RBC: 5.02 MIL/uL (ref 4.22–5.81)
RDW: 13.2 % (ref 11.5–15.5)
WBC: 10.3 10*3/uL (ref 4.0–10.5)
nRBC: 0 % (ref 0.0–0.2)

## 2019-12-31 LAB — PROTEIN, CSF: Total  Protein, CSF: 50 mg/dL — ABNORMAL HIGH (ref 15–45)

## 2019-12-31 MED ORDER — MIDAZOLAM HCL 2 MG/2ML IJ SOLN
2.0000 mg | Freq: Once | INTRAMUSCULAR | Status: AC
  Administered 2019-12-31: 2 mg via INTRAVENOUS
  Filled 2019-12-31: qty 2

## 2019-12-31 MED ORDER — OXYCODONE-ACETAMINOPHEN 5-325 MG PO TABS: 1.0000 | ORAL_TABLET | Freq: Once | ORAL | Status: DC

## 2019-12-31 MED ORDER — MORPHINE SULFATE (PF) 2 MG/ML IV SOLN
2.0000 mg | Freq: Once | INTRAVENOUS | Status: AC
  Administered 2019-12-31: 2 mg via INTRAVENOUS
  Filled 2019-12-31: qty 1

## 2019-12-31 MED ORDER — SODIUM CHLORIDE 0.9 % IV BOLUS
1000.0000 mL | Freq: Once | INTRAVENOUS | Status: AC
  Administered 2019-12-31: 1000 mL via INTRAVENOUS

## 2019-12-31 MED ORDER — IOHEXOL 350 MG/ML SOLN
75.0000 mL | Freq: Once | INTRAVENOUS | Status: AC | PRN
  Administered 2019-12-31: 75 mL via INTRAVENOUS
  Filled 2019-12-31: qty 75

## 2019-12-31 MED ORDER — HALOPERIDOL LACTATE 5 MG/ML IJ SOLN
5.0000 mg | Freq: Once | INTRAMUSCULAR | Status: AC
  Administered 2019-12-31: 5 mg via INTRAVENOUS
  Filled 2019-12-31: qty 1

## 2019-12-31 MED ORDER — ORPHENADRINE CITRATE 30 MG/ML IJ SOLN: 60.0000 mg | Freq: Two times a day (BID) | INTRAMUSCULAR | Status: DC

## 2019-12-31 MED ORDER — ONDANSETRON HCL 4 MG/2ML IJ SOLN
4.0000 mg | Freq: Once | INTRAMUSCULAR | Status: AC
  Administered 2019-12-31: 4 mg via INTRAVENOUS
  Filled 2019-12-31: qty 2

## 2019-12-31 NOTE — ED Provider Notes (Cosign Needed)
[facility] Emergency Department Provider Note  ____________________________________________  Time seen: Approximately 7:01 PM  I have reviewed the triage vital signs and the nursing notes.   HISTORY  Chief Complaint Fall and Headache    HPI Zachary Mendoza. is a 29 y.o. male that presents to emergency department for evaluation of headache and neck pain after an injury yesterday and smoking meth this morning. Patient was trying to carry a refrigerator up some porch stairs when it was raining. He fell backwards off of the porch steps.  He landed with the refrigerator almost on top of him.  His neck bent forwards.  He did not hit his head or lose consciousness.  He felt completely fine last night following the injury.  He felt fine this morning when he woke up.  He was with some friends and smoked meth, and when he was exhaling, he immediately developed a throbbing headache that wrapped around his head.  He states that all day he has not been able to move his neck in any direction without feeling like his head will explode.  He has had some dizziness.  This is the worst headache of his life.  He states that he has smoked meth once previously without any issues.  He did take the Librium he was prescribed last night.  No shortness of breath, chest pain, abdominal pain, back pain.  Past Medical History:  Diagnosis Date  . Hypertension   . Panic attacks     Patient Active Problem List   Diagnosis Date Noted  . Leukocytosis 10/04/2019  . Abnormal LFTs 10/04/2019  . Hematemesis 10/04/2019  . Nausea vomiting and diarrhea 10/04/2019  . Hypertension   . Tobacco abuse   . Abdominal pain   . Chest pain   . Esophagitis   . Alcohol abuse 03/29/2019  . Severe anxiety with panic 03/28/2019  . Alcoholic intoxication with complication [facility])     Past Surgical History:  Procedure Laterality Date  . ESOPHAGOGASTRODUODENOSCOPY (EGD) WITH PROPOFOL N/A 10/04/2019    Procedure: ESOPHAGOGASTRODUODENOSCOPY (EGD) WITH PROPOFOL;  Surgeon: Midge Minium, MD;  Location: [facility] ENDOSCOPY;  Service: Endoscopy;  Laterality: N/A;    Prior to Admission medications   Medication Sig Start Date End Date Taking? Authorizing Provider  busPIRone (BUSPAR) 7.5 MG tablet Take 1 tablet (7.5 mg total) by mouth 2 (two) times daily. 10/06/19   Pennie Banter, DO  carvedilol (COREG) 6.25 MG tablet Take 1 tablet (6.25 mg total) by mouth 2 (two) times daily with a meal. 03/30/19   Clapacs, Jackquline Denmark, MD  escitalopram (LEXAPRO) 20 MG tablet Take 20 mg by mouth daily. 09/21/19   [provider]  hydrOXYzine (VISTARIL) 50 MG capsule Take 1 capsule (50 mg total) by mouth every 6 (six) hours as needed (anxiety/panic attack). 10/06/19   Pennie Banter, DO  Multiple Vitamin (MULTIVITAMIN WITH MINERALS) TABS tablet Take 1 tablet by mouth daily. 10/07/19   Pennie Banter, DO  pantoprazole (PROTONIX) 40 MG tablet Take 1 tablet (40 mg total) by mouth 2 (two) times daily. 10/06/19   Esaw Grandchild A, DO  phenol (CHLORASEPTIC) 1.4 % LIQD Use as directed 1 spray in the mouth or throat as needed for throat irritation / pain. 10/06/19   Pennie Banter, DO  QUEtiapine (SEROQUEL) 25 MG tablet Take 25 mg by mouth 2 (two) times daily as needed. 11/02/19   [provider]  sucralfate (CARAFATE) 1 GM/10ML suspension Take 10 mLs (1 g  total) by mouth 4 (four) times daily -  with meals and at bedtime. 10/06/19   Pennie Banter, DO    Allergies Penicillins  Family History  Problem Relation Age of Onset  . Hypertension Mother     Social History Social History   Tobacco Use  . Smoking status: Current Every Day Smoker    Packs/day: 1.00    Types: Cigarettes  . Smokeless tobacco: Never Used  Vaping Use  . Vaping Use: Never used  Substance Use Topics  . Alcohol use: Not Currently    Comment: about week ago  . Drug use: Not Currently    Types: Methamphetamines    Comment: 1x this  am     Review of Systems  Cardiovascular: No chest pain. Respiratory: No cough. No SOB. Gastrointestinal: No abdominal pain.  No nausea, no vomiting.  Musculoskeletal: Positive for neck pain. Skin: Negative for rash, abrasions, lacerations, ecchymosis. Neurological: Positive for headache.   ____________________________________________   PHYSICAL EXAM:  VITAL SIGNS: ED Triage Vitals  Enc Vitals Group     BP 12/31/19 1742 (!) 158/106     Pulse Rate 12/31/19 1742 (!) 120     Resp 12/31/19 1742 17     Temp 12/31/19 1742 98.4 F (36.9 C)     Temp src --      SpO2 12/31/19 1742 100 %     Weight 12/31/19 1735 230 lb (104.3 kg)     Height 12/31/19 1735 5\' 11"  (1.803 m)     Head Circumference --      Peak Flow --      Pain Score 12/31/19 1735 8     Pain Loc --      Pain Edu? --      Excl. in GC? --      Constitutional: Alert and oriented.  Eyes: Conjunctivae are normal. PERRL. EOMI. Head: Atraumatic. ENT:      Ears:      Nose: No congestion/rhinnorhea.      Mouth/Throat: Mucous membranes are moist.  Neck: No stridor.  No cervical spine tenderness to palpation.  Tenderness to palpation to cervical paraspinal muscles. Cardiovascular: Normal rate, regular rhythm.  Good peripheral circulation. Respiratory: Normal respiratory effort without tachypnea or retractions. Lungs CTAB. Good air entry to the bases with no decreased or absent breath sounds. Gastrointestinal: Bowel sounds 4 quadrants. Soft and nontender to palpation. No guarding or rigidity. No palpable masses. No distention.  Musculoskeletal: Full range of motion to all extremities. No gross deformities appreciated. Neurologic:  Normal speech and language. No gross focal neurologic deficits are appreciated.  Skin:  Skin is warm, dry and intact. No rash noted. Psychiatric: Mood and affect are normal. Speech and behavior are normal. Patient exhibits appropriate insight and  judgement.   ____________________________________________   LABS (all labs ordered are listed, but only abnormal results are displayed)  Labs Reviewed - No data to display ____________________________________________  EKG   ____________________________________________  RADIOLOGY 01/02/20, personally viewed and evaluated these images (plain radiographs) as part of my medical decision making, as well as reviewing the written report by the radiologist.  CT Head Wo Contrast  Result Date: 12/31/2019 CLINICAL DATA:  Head trauma headache fell off porch EXAM: CT HEAD WITHOUT CONTRAST TECHNIQUE: Contiguous axial images were obtained from the base of the skull through the vertex without intravenous contrast. COMPARISON:  CT 08/31/2019 FINDINGS: Brain: No evidence of acute infarction, hemorrhage, hydrocephalus, extra-axial collection or mass lesion/mass effect. Vascular: No hyperdense vessel  or unexpected calcification. Skull: Normal. Negative for fracture or focal lesion. Sinuses/Orbits: No acute finding. Other: None IMPRESSION: Negative non contrasted CT appearance of the brain. Electronically Signed   By: Jasmine PangKim  Fujinaga M.D.   On: 12/31/2019 18:28   CT CERVICAL SPINE WO CONTRAST  Result Date: 12/31/2019 CLINICAL DATA:  Larey SeatFell off porch EXAM: CT CERVICAL SPINE WITHOUT CONTRAST TECHNIQUE: Multidetector CT imaging of the cervical spine was performed without intravenous contrast. Multiplanar CT image reconstructions were also generated. COMPARISON:  CT 08/31/2019 FINDINGS: Alignment: Mild reversal of cervical lordosis. No subluxation. Facet alignment within normal limits. Skull base and vertebrae: No acute fracture. No primary bone lesion or focal pathologic process. Soft tissues and spinal canal: No prevertebral fluid or swelling. No visible canal hematoma. Disc levels:  Mild degenerative changes at C5-C6 and C6-C7. Upper chest: Negative. Other: None IMPRESSION: Mild reversal of cervical lordosis.  No acute osseous abnormality. Electronically Signed   By: Jasmine PangKim  Fujinaga M.D.   On: 12/31/2019 18:32    ____________________________________________    PROCEDURES  Procedure(s) performed:    Procedures    Medications - No data to display   ____________________________________________   INITIAL IMPRESSION / ASSESSMENT AND PLAN / ED COURSE  Pertinent labs & imaging results that were available during my care of the patient were reviewed by me and considered in my medical decision making (see chart for details).  Review of the [place] CSRS was performed in accordance of the [facility] prior to dispensing any controlled drugs.   Patient presented to the emergency department for evaluation of severe headache after trauma yesterday and smoking meth today. On arrival, patient was significantly uncomfortable and clutching his head.  He was unable to move at all in the bed without yelling in pain.  Patient was given IV morphine and Zofran for pain control.  CT head and cervical are negative for acute intracranial abnormality or acute bony abnormality.  Labs are largely unremarkable.  ----------------------------------------- 8:30 PM on 12/31/2019 -----------------------------------------  Exam, labs, CT scan were discussed with Dr. Derrill KayGoodman.  He recommends IV Haldol for additional pain control.  He is in agreement with CT head and neck angio.  Orders placed.  ----------------------------------------- 9:45 PM on 12/31/2019 -----------------------------------------  See CT angio results above.  CT angio results were discussed with Dr. Adriana Simasook.  He recommends lumbar puncture for further evaluation and to rule out subarachnoid hemorrhage.  Patient was transferred to the main side of the emergency department for lumbar puncture and further work-up with disposition.  Case was discussed with Dr. Marisa SeverinSiadecki who will continue patient's care.    Gawain L Mihalic Jr. was evaluated in Emergency Department on  12/31/2019 for the symptoms described in the history of present illness. He was evaluated in the context of the global COVID-19 pandemic, which necessitated consideration that the patient might be at risk for infection with the SARS-CoV-2 virus that causes COVID-19. Institutional protocols and algorithms that pertain to the evaluation of patients at risk for COVID-19 are in a state of rapid change based on information released by regulatory bodies including the CDC and federal and state organizations. These policies and algorithms were followed during the patient's care in the ED.   ____________________________________________  FINAL CLINICAL IMPRESSION(S) / ED DIAGNOSES  Final diagnoses:  None      NEW MEDICATIONS STARTED DURING THIS VISIT:  ED Discharge Orders    None          This chart was dictated using voice recognition software/Dragon. Despite best efforts  to proofread, errors can occur which can change the meaning. Any change was purely unintentional.    Enid Derry, PA-C 12/31/19 2350

## 2019-12-31 NOTE — ED Provider Notes (Signed)
-----------------------------------------   11:09 PM on 12/31/2019 -----------------------------------------  I took over care of this patient from PA [facility].  CT angios shows vascular dilation, and Dr. Adriana Simas from neurosurgery was consulted.  He recommends LP to rule out SAH.  If this is negative he recommends admission for neurology work-up.  If there is evidence of bleeding, the patient will need to be transferred for neurosurgical evaluation.  On my assessment, the patient was fully arousable and able to carry on a conversation with me.  He demonstrated appropriate decision-making capacity and understanding of his condition and consented to an LP.  LP was performed without complication.  The CSF is in the lab.  I have signed the patient out to the oncoming physician Dr. Dolores Frame with disposition plan as above.  .Lumbar Puncture  Date/Time: 12/31/2019 11:12 PM Performed by: Dionne Bucy, MD Authorized by: Dionne Bucy, MD   Consent:    Consent obtained:  Verbal and written   Consent given by:  Patient   Risks discussed:  Headache, bleeding, infection, pain and nerve damage   Alternatives discussed:  Observation Pre-procedure details:    Procedure purpose:  Diagnostic   Preparation: Patient was prepped and draped in usual sterile fashion   Sedation:    Sedation type:  Anxiolysis Anesthesia (see MAR for exact dosages):    Anesthesia method:  Local infiltration   Local anesthetic:  Lidocaine 1% w/o epi Procedure details:    Lumbar space:  L4-L5 interspace   Patient position:  R lateral decubitus   Needle gauge:  20   Needle type:  Spinal needle - Quincke tip   Ultrasound guidance: yes     Number of attempts:  1   Fluid appearance:  Clear   Tubes of fluid:  4   Total volume (ml):  8 Post-procedure:    Puncture site:  Adhesive bandage applied and direct pressure applied   Patient tolerance of procedure:  Tolerated well, no immediate complications       Dionne Bucy, MD 12/31/19 2312

## 2019-12-31 NOTE — ED Notes (Signed)
Spinal tap completed. Bleeding to site controlled, with a dressing applied. Pt A&OX4 and calm

## 2019-12-31 NOTE — ED Notes (Signed)
See triage note  States he fell backwards  States he doesn't think he hit his head  No LOC  Was going ok until he took a hit from a meth pipe.  Now feels like his head is going to explode

## 2019-12-31 NOTE — ED Triage Notes (Addendum)
Pt comes via POV from home with c/o fall and hitting head. Pt states he just lost his balance and fell. Pt states severe pain to head.  Pt states this am he was ok and then later felt like his head exploded. Pt denies any blood thinners.  Pt also states neck pain

## 2019-12-31 NOTE — ED Notes (Signed)
Spinal tap preformed at this time by EDP Saidecki. Fluid placed into appropriate containers and labeled in the correct order

## 2019-12-31 NOTE — ED Notes (Signed)
CSF Specimens walked down to the lab by RN Gracie at this time

## 2020-01-01 LAB — CSF CELL COUNT WITH DIFFERENTIAL
Eosinophils, CSF: 0 %
Eosinophils, CSF: 0 %
Lymphs, CSF: 100 %
Lymphs, CSF: 50 %
Monocyte-Macrophage-Spinal Fluid: 0 %
Monocyte-Macrophage-Spinal Fluid: 50 %
RBC Count, CSF: 0 /mm3 (ref 0–3)
RBC Count, CSF: 0 /mm3 (ref 0–3)
Segmented Neutrophils-CSF: 0 %
Segmented Neutrophils-CSF: 0 %
Tube #: 1
Tube #: 4
WBC, CSF: 5 /mm3 (ref 0–5)
WBC, CSF: 7 /mm3 — ABNORMAL HIGH (ref 0–5)

## 2020-01-01 LAB — SARS CORONAVIRUS 2 BY RT PCR (HOSPITAL ORDER, PERFORMED IN ~~LOC~~ HOSPITAL LAB): SARS Coronavirus 2: NEGATIVE

## 2020-01-01 NOTE — Discharge Instructions (Addendum)
You are leaving AGAINST MEDICAL ADVICE.  It was recommended that you stay in the hospital for neurology evaluation of your bad headache.  Please call the number above for follow-up appointment.  Return to the ER for worsening symptoms, persistent vomiting, lethargy or other concerns.

## 2020-01-01 NOTE — ED Provider Notes (Signed)
-----------------------------------------   00:00 AM on 01/01/2020 -----------------------------------------  Assumed care from Dr. Marisa Severin.  Awaiting results of CSF.  If positive for [facility], patient will require transfer to tertiary care facility.  If negative, will admit to our facility.   ----------------------------------------- 2:21 AM on 01/01/2020 -----------------------------------------  CSF cell count negative for xanthochromia.  Will discuss with hospitalist services for admission.   Irean Hong, MD 01/01/20 [phone]

## 2020-01-01 NOTE — ED Provider Notes (Signed)
-----------------------------------------   3:34 AM on 01/01/2020 -----------------------------------------  Patient desires to leave AMA.  He is feeling significantly better.  States his wife has to work and they have 4 kids.  Discussed with patient recommendations for staying in the hospital for neurology valuation of his bad headache.  He verbalizes the risks of leaving AGAINST MEDICAL ADVICE including worsening headache, permanent neurological damage or even death.  He also states that he "knows what happened and I won't do it again".  I have encouraged him to follow-up with neurology as an outpatient.  Strict return precautions given.  Patient verbalizes understanding agrees with plan of care.   Irean Hong, MD 01/01/20 [phone]

## 2020-01-01 NOTE — ED Notes (Signed)
Pt leaving against medical advice- EDP Dolores Frame to the bedside to discuss risks and benefits with this RN as a witness. Pt still adamant on leaving and forgoing his admission. Pt unable to sign AMA E-signature due to signature pad malfunction. This RN, pt and EDP Dolores Frame all signed a hard copy of the AMA form which was placed in the chart for medical records pick up. Pt verbalized understanding of d/c instructions and had no additional questions or concerns for this RN or provider. Pt left with d/c instructions and gathered all personal belongings from room and removed them prior to ED departure.

## 2020-01-01 NOTE — Progress Notes (Signed)
I spoke with emergency department Zachary Mendoza and his recent traumatic fall.  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  Given the description of his headache being the worst of his life, I did recommend continued evaluation for possible occult subarachnoid hemorrhage with a LP.  The CSF from that lumbar puncture does appear to be normal without any sign of xanthochromia or hemorrhage.  Given this, I had recommended follow-up with neurology for evaluation of the stenosis as well as severe headaches.  However, the patient did refuse admission and further evaluation.

## 2020-01-03 ENCOUNTER — Other Ambulatory Visit: Payer: Self-pay

## 2020-01-03 ENCOUNTER — Emergency Department
Admission: EM | Admit: 2020-01-03 | Discharge: 2020-01-05 | Disposition: A | Discharge status: Home / Self Care | Payer: Self-pay | Attending: Emergency Medicine | Admitting: Emergency Medicine

## 2020-01-03 ENCOUNTER — Encounter: Payer: Self-pay | Admitting: Emergency Medicine

## 2020-01-03 DIAGNOSIS — F17210 Nicotine dependence, cigarettes, uncomplicated: Secondary | ICD-10-CM | POA: Insufficient documentation

## 2020-01-03 DIAGNOSIS — F10920 Alcohol use, unspecified with intoxication, uncomplicated: Secondary | ICD-10-CM

## 2020-01-03 DIAGNOSIS — F4321 Adjustment disorder with depressed mood: Secondary | ICD-10-CM

## 2020-01-03 DIAGNOSIS — Z20822 Contact with and (suspected) exposure to covid-19: Secondary | ICD-10-CM | POA: Insufficient documentation

## 2020-01-03 DIAGNOSIS — F10129 Alcohol abuse with intoxication, unspecified: Secondary | ICD-10-CM | POA: Insufficient documentation

## 2020-01-03 DIAGNOSIS — I10 Essential (primary) hypertension: Secondary | ICD-10-CM | POA: Insufficient documentation

## 2020-01-03 DIAGNOSIS — Z79899 Other long term (current) drug therapy: Secondary | ICD-10-CM | POA: Insufficient documentation

## 2020-01-03 LAB — URINE DRUG SCREEN, QUALITATIVE (ARMC ONLY)
Amphetamines, Ur Screen: NOT DETECTED
Barbiturates, Ur Screen: NOT DETECTED
Benzodiazepine, Ur Scrn: POSITIVE — AB
Cannabinoid 50 Ng, Ur ~~LOC~~: NOT DETECTED
Cocaine Metabolite,Ur ~~LOC~~: NOT DETECTED
MDMA (Ecstasy)Ur Screen: NOT DETECTED
Methadone Scn, Ur: NOT DETECTED
Opiate, Ur Screen: NOT DETECTED
Phencyclidine (PCP) Ur S: NOT DETECTED
Tricyclic, Ur Screen: NOT DETECTED

## 2020-01-03 LAB — ETHANOL: Alcohol, Ethyl (B): 357 mg/dL (ref <10)

## 2020-01-03 LAB — CBC
HCT: 45.6 % (ref 39.0–52.0)
Hemoglobin: 15.2 g/dL (ref 13.0–17.0)
MCH: 28.6 pg (ref 26.0–34.0)
MCHC: 33.3 g/dL (ref 30.0–36.0)
MCV: 85.9 fL (ref 80.0–100.0)
Platelets: 367 10*3/uL (ref 150–400)
RBC: 5.31 MIL/uL (ref 4.22–5.81)
RDW: 13.1 % (ref 11.5–15.5)
WBC: 7 10*3/uL (ref 4.0–10.5)
nRBC: 0 % (ref 0.0–0.2)

## 2020-01-03 LAB — COMPREHENSIVE METABOLIC PANEL
ALT: 92 U/L — ABNORMAL HIGH (ref 0–44)
AST: 64 U/L — ABNORMAL HIGH (ref 15–41)
Albumin: 4.3 g/dL (ref 3.5–5.0)
Alkaline Phosphatase: 67 U/L (ref 38–126)
Anion gap: 13 (ref 5–15)
BUN: 14 mg/dL (ref 6–20)
CO2: 25 mmol/L (ref 22–32)
Calcium: 9.2 mg/dL (ref 8.9–10.3)
Chloride: 103 mmol/L (ref 98–111)
Creatinine, Ser: 0.88 mg/dL (ref 0.61–1.24)
GFR calc Af Amer: >60 mL/min (ref >60)
GFR calc non Af Amer: >60 mL/min (ref >60)
Glucose, Bld: 126 mg/dL — ABNORMAL HIGH (ref 70–99)
Potassium: 3.9 mmol/L (ref 3.5–5.1)
Sodium: 141 mmol/L (ref 135–145)
Total Bilirubin: 0.7 mg/dL (ref 0.3–1.2)
Total Protein: 7.9 g/dL (ref 6.5–8.1)

## 2020-01-03 MED ORDER — LORAZEPAM 2 MG/ML IJ SOLN: 0.0000 mg | Freq: Four times a day (QID) | INTRAMUSCULAR | Status: DC

## 2020-01-03 MED ORDER — PROPRANOLOL HCL 20 MG PO TABS
20.0000 mg | ORAL_TABLET | Freq: Once | ORAL | Status: AC
  Administered 2020-01-03: 20 mg via ORAL
  Filled 2020-01-03: qty 1

## 2020-01-03 MED ORDER — LORAZEPAM 2 MG PO TABS: 0.0000 mg | ORAL_TABLET | Freq: Two times a day (BID) | ORAL | Status: DC

## 2020-01-03 MED ORDER — ONDANSETRON 4 MG PO TBDP
8.0000 mg | ORAL_TABLET | Freq: Once | ORAL | Status: AC
  Administered 2020-01-03: 8 mg via ORAL
  Filled 2020-01-03: qty 2

## 2020-01-03 MED ORDER — LORAZEPAM 2 MG/ML IJ SOLN: 0.0000 mg | Freq: Two times a day (BID) | INTRAMUSCULAR | Status: DC

## 2020-01-03 MED ORDER — DROPERIDOL 2.5 MG/ML IJ SOLN
5.0000 mg | Freq: Once | INTRAMUSCULAR | Status: AC
  Administered 2020-01-03: 5 mg via INTRAMUSCULAR
  Filled 2020-01-03: qty 2

## 2020-01-03 MED ORDER — LORAZEPAM 2 MG PO TABS
0.0000 mg | ORAL_TABLET | Freq: Four times a day (QID) | ORAL | Status: DC
  Administered 2020-01-03: 2 mg via ORAL
  Administered 2020-01-04 (×2): 1 mg via ORAL
  Administered 2020-01-05 (×2): 2 mg via ORAL
  Filled 2020-01-03 (×5): qty 1

## 2020-01-03 MED ORDER — THIAMINE HCL 100 MG PO TABS
100.0000 mg | ORAL_TABLET | Freq: Every day | ORAL | Status: DC
  Administered 2020-01-03 – 2020-01-05 (×3): 100 mg via ORAL
  Filled 2020-01-03 (×3): qty 1

## 2020-01-03 MED ORDER — THIAMINE HCL 100 MG/ML IJ SOLN: 100.0000 mg | Freq: Every day | INTRAMUSCULAR | Status: DC

## 2020-01-03 MED ORDER — FAMOTIDINE 20 MG PO TABS
40.0000 mg | ORAL_TABLET | Freq: Once | ORAL | Status: AC
  Administered 2020-01-03: 40 mg via ORAL
  Filled 2020-01-03: qty 2

## 2020-01-03 NOTE — ED Triage Notes (Signed)
First Nurse Note:  Patient presents to the ED tearful and reports anxiety.  Patient states his father died today of cancer.

## 2020-01-03 NOTE — ED Triage Notes (Signed)
Pt presents to ED via POV with c/o anxiety. Pt states dad died yesterday and needs something to calm him down. Pt also endorses drinking 1/5 of brandy today, pt states started drinking at 1030 this morning. Pt with noted slurred speech and difficulty ambulating. Pt denies active SI/HI.

## 2020-01-03 NOTE — BH Assessment (Signed)
Attempted to assess patient, patient unable to participate in assessment at this time, patient continues to be drowsy and sleeping. Will attempt at a later time.

## 2020-01-03 NOTE — ED Notes (Signed)
See triage note. Pt denies SI/HI. Pt stating his dad just died from cancer. Pt tearful and speech slurred while talking to this RN. Pt stating he drank a fifth of brandy PTA. MD at bedside

## 2020-01-03 NOTE — ED Notes (Signed)
Pt attempting to get out of bed. This RN and Mel, EDT at bedside. Pt assisted back in bed. Pt repositioned. Pt given urinal.

## 2020-01-03 NOTE — ED Provider Notes (Signed)
-----------------------------------------   3:04 PM on 01/03/2020 -----------------------------------------  Blood pressure (!) 140/95, pulse (!) 110, temperature 98.6 F (37 C), temperature source Oral, resp. rate 20, height 5\' 11"  (1.803 m), weight (!) 104.3 kg, SpO2 96 %.  Assuming care from Dr. .  In short, Zachary Mendoza [address]. is a 29 y.o. male with a chief complaint of Alcohol Intoxication and Anxiety .  Refer to the original H&P for additional details.  The current plan of care is to reassess when clinically sober, no threat to himself or others at this time.  ----------------------------------------- 4:55 PM on 01/03/2020 -----------------------------------------  Patient becoming increasingly aggressive towards staff, yelling out and attempting to leave.  He continues to appear intoxicated with slurred speech and unsteady gait, is not currently safe to leave the ED and does not currently have capacity to leave AGAINST MEDICAL ADVICE.  He was placed under IVC due to threat to his own safety as well as staff and we will medicate with IM droperidol.  Reviewing prior EKGs, his QTC has been within normal limits earlier this month.  Due to his substance abuse related to the death of his father and increasing aggression, we will consult psychiatry.  The patient has been placed in psychiatric observation due to the need to provide a safe environment for the patient while obtaining psychiatric consultation and evaluation, as well as ongoing medical and medication management to treat the patient's condition.  The patient has been placed under full IVC at this time.     01/05/2020, MD 01/03/20 [phone]

## 2020-01-03 NOTE — ED Notes (Signed)
Pt threw drink on floor. Floor cleaned and EVS called.

## 2020-01-03 NOTE — BH Assessment (Addendum)
Assessment Note  Zachary Mendoza Hageman. is an 29 y.o. male-  Unable to complete TTS consult at this time because patient is unable to participate in the assessment. Upon arrival to the ER, his BAC was 357 and he was also giving medications which has caused him to be too drowsy and sleep.  Diagnosis: Alcohol Use disorder  Past Medical History:  Past Medical History:  Diagnosis Date  . Hypertension   . Panic attacks     Past Surgical History:  Procedure Laterality Date  . ESOPHAGOGASTRODUODENOSCOPY (EGD) WITH PROPOFOL N/A 10/04/2019   Procedure: ESOPHAGOGASTRODUODENOSCOPY (EGD) WITH PROPOFOL;  Surgeon: Midge Minium, MD;  Location: [facility] ENDOSCOPY;  Service: Endoscopy;  Laterality: N/A;    Family History:  Family History  Problem Relation Age of Onset  . Hypertension Mother     Social History:  reports that he has been smoking cigarettes. He has been smoking about 1.00 pack per day. He has never used smokeless tobacco. He reports previous alcohol use. He reports previous drug use. Drug: Methamphetamines.  Additional Social History:     CIWA: CIWA-Ar BP: (!) 120/86 Pulse Rate: 86 Nausea and Vomiting: mild nausea with no vomiting Tactile Disturbances: none Tremor: three Auditory Disturbances: not present Paroxysmal Sweats: two Visual Disturbances: not present Anxiety: five Headache, Fullness in Head: none present Agitation: five Orientation and Clouding of Sensorium: cannot do serial additions or is uncertain about date CIWA-Ar Total: 17 COWS:    Allergies:  Allergies  Allergen Reactions  . Penicillins Hives    Pt states he is not allergic, but that his sister was.    Home Medications: (Not in a hospital admission)   OB/GYN Status:  No LMP for male patient.  General Assessment Data Assessment unable to be completed: Yes Reason for Not Completing Assessment: Patient is unable to participate in the assessment Admission Status: Involuntary   Mental Status  Report Motor Activity: Freedom of movement    Advance Directives (For Healthcare) Does Patient Have a Medical Advance Directive?: No Would patient like information on creating a medical advance directive?: No - Patient declined    Disposition:  Pending TTS Consult  On Site Evaluation by:   Reviewed with Physician:    Lilyan Gilford MS, LCAS, [facility], N[facility] Therapeutic Triage Specialist 01/03/2020 6:15 PM

## 2020-01-03 NOTE — ED Notes (Signed)
Pt stating male assist him to use urinal. Male Engineer, materials at bedside. Pt urinating on the floor while attempting to stand. Pt assisted back in bed at this time. Pt spitting on floor at this time.

## 2020-01-03 NOTE — ED Notes (Signed)
Date and time results received: 01/03/20 1430 (use smartphrase ".now" to insert current time)  Test: Alcohol  Critical Value: 357  Name of Provider Notified: Dr. Scotty Court  Orders Received? Or Actions Taken?: No new orders at this time

## 2020-01-03 NOTE — ED Notes (Signed)
Pt resting at this time.

## 2020-01-03 NOTE — ED Notes (Addendum)
Pt becoming verbally aggressive and tearful. Pt cussing out loud and at staff repeatedly. Pt requesting food. EDT gave pt graham crackers. Pt threw graham crackers on floor and continued verbal abuse. MD at bedside. Pt stating, "I am getting the fuck out of this place." MD telling pt it is not safe to leave due to pt ETOH intoxication. Pt stating, "Watch me, I am going to fucking leave." MD placing emergent IVC orders at this time

## 2020-01-03 NOTE — ED Provider Notes (Signed)
[facility] Emergency Department Provider Note  ____________________________________________  Time seen: Approximately 2:49 PM  I have reviewed the triage vital signs and the nursing notes.   HISTORY  Chief Complaint Alcohol Intoxication and Anxiety    Level 5 Caveat: Portions of the History and Physical including HPI and review of systems are unable to be completely obtained due to patient being a poor historian   HPI Zachary L Knutson Montez Hageman. is a 29 y.o. male with a history of hypertension panic attacks and alcohol abuse who comes to the ED complaining of anxiety, feeling very sad because his dad died today of cancer.  He reports that his mom had previously died as well.  He drank 1/5 of liquor to help cope.  He denies SI HI or hallucinations.  Denies any history of alcohol withdrawal symptoms.  He requests Ativan to help him sleep.  Denies chest pain shortness of breath or other drug use.  No vomiting.  Been eating normally.      Past Medical History:  Diagnosis Date  . Hypertension   . Panic attacks      Patient Active Problem List   Diagnosis Date Noted  . Leukocytosis 10/04/2019  . Abnormal LFTs 10/04/2019  . Hematemesis 10/04/2019  . Nausea vomiting and diarrhea 10/04/2019  . Hypertension   . Tobacco abuse   . Abdominal pain   . Chest pain   . Esophagitis   . Alcohol abuse 03/29/2019  . Severe anxiety with panic 03/28/2019  . Alcoholic intoxication with complication [facility])      Past Surgical History:  Procedure Laterality Date  . ESOPHAGOGASTRODUODENOSCOPY (EGD) WITH PROPOFOL N/A 10/04/2019   Procedure: ESOPHAGOGASTRODUODENOSCOPY (EGD) WITH PROPOFOL;  Surgeon: Midge Minium, MD;  Location: [facility] ENDOSCOPY;  Service: Endoscopy;  Laterality: N/A;     Prior to Admission medications   Medication Sig Start Date End Date Taking? Authorizing Provider  busPIRone (BUSPAR) 7.5 MG tablet Take 1 tablet (7.5 mg total) by mouth 2 (two) times daily.  10/06/19   Pennie Banter, DO  carvedilol (COREG) 6.25 MG tablet Take 1 tablet (6.25 mg total) by mouth 2 (two) times daily with a meal. 03/30/19   Clapacs, Jackquline Denmark, MD  escitalopram (LEXAPRO) 20 MG tablet Take 20 mg by mouth daily. 09/21/19   [provider]  hydrOXYzine (VISTARIL) 50 MG capsule Take 1 capsule (50 mg total) by mouth every 6 (six) hours as needed (anxiety/panic attack). 10/06/19   Pennie Banter, DO  Multiple Vitamin (MULTIVITAMIN WITH MINERALS) TABS tablet Take 1 tablet by mouth daily. 10/07/19   Pennie Banter, DO  pantoprazole (PROTONIX) 40 MG tablet Take 1 tablet (40 mg total) by mouth 2 (two) times daily. 10/06/19   Esaw Grandchild A, DO  phenol (CHLORASEPTIC) 1.4 % LIQD Use as directed 1 spray in the mouth or throat as needed for throat irritation / pain. 10/06/19   Pennie Banter, DO  QUEtiapine (SEROQUEL) 25 MG tablet Take 25 mg by mouth 2 (two) times daily as needed. 11/02/19   [provider]  sucralfate (CARAFATE) 1 GM/10ML suspension Take 10 mLs (1 g total) by mouth 4 (four) times daily -  with meals and at bedtime. 10/06/19   Pennie Banter, DO     Allergies Penicillins   Family History  Problem Relation Age of Onset  . Hypertension Mother     Social History Social History   Tobacco Use  . Smoking status: Current Every Day Smoker  Packs/day: 1.00    Types: Cigarettes  . Smokeless tobacco: Never Used  Vaping Use  . Vaping Use: Never used  Substance Use Topics  . Alcohol use: Not Currently    Comment: about week ago  . Drug use: Not Currently    Types: Methamphetamines    Comment: 1x this am    Review of Systems Level 5 Caveat: Portions of the History and Physical including HPI and review of systems are unable to be completely obtained due to patient being a poor historian   Constitutional:   No known fever.  ENT:   No rhinorrhea. Cardiovascular:   No chest pain or syncope. Respiratory:   No dyspnea or  cough. Gastrointestinal:   Negative for abdominal pain, vomiting and diarrhea.  Musculoskeletal:   Negative for focal pain or swelling ____________________________________________   PHYSICAL EXAM:  VITAL SIGNS: ED Triage Vitals [01/03/20 1336]  Enc Vitals Group     BP (!) 140/95     Pulse Rate (!) 110     Resp 20     Temp 98.6 F (37 C)     Temp Source Oral     SpO2 96 %     Weight (!) 230 lb (104.3 kg)     Height 5\' 11"  (1.803 m)     Head Circumference      Peak Flow      Pain Score 0     Pain Loc      Pain Edu?      Excl. in GC?     Vital signs reviewed, nursing assessments reviewed.   Constitutional:   Alert and oriented. Non-toxic appearance. Eyes:   Conjunctivae are normal. EOMI. PERRL. ENT      Head:   Normocephalic and atraumatic.      Nose:   No congestion/rhinnorhea.       Mouth/Throat:   MMM, no pharyngeal erythema. No peritonsillar mass.  Fruity alcohol odor on breath      Neck:   No meningismus. Full ROM. Hematological/Lymphatic/Immunilogical:   No cervical lymphadenopathy. Cardiovascular:   RRR, heart rate 90. Symmetric bilateral radial and DP pulses.  No murmurs. Cap refill less than 2 seconds. Respiratory:   Normal respiratory effort without tachypnea/retractions. Breath sounds are clear and equal bilaterally. No wheezes/rales/rhonchi. Gastrointestinal:   Soft and nontender. Non distended. There is no CVA tenderness.  No rebound, rigidity, or guarding.  Musculoskeletal:   Normal range of motion in all extremities. No joint effusions.  No lower extremity tenderness.  No edema. Neurologic:   Normal speech and language.  Motor grossly intact. No acute focal neurologic deficits are appreciated.  Skin:    Skin is warm, dry and intact. No rash noted.  No petechiae, purpura, or bullae.  ____________________________________________    LABS (pertinent positives/negatives) (all labs ordered are listed, but only abnormal results are displayed) Labs Reviewed   COMPREHENSIVE METABOLIC PANEL - Abnormal; Notable for the following components:      Result Value   Glucose, Bld 126 (*)    AST 64 (*)    ALT 92 (*)    All other components within normal limits  ETHANOL - Abnormal; Notable for the following components:   Alcohol, Ethyl (B) 357 (*)    All other components within normal limits  URINE DRUG SCREEN, QUALITATIVE ([facility] ONLY) - Abnormal; Notable for the following components:   Benzodiazepine, Ur Scrn POSITIVE (*)    All other components within normal limits  CBC   ____________________________________________  EKG    ____________________________________________    RADIOLOGY  No results found.  ____________________________________________   PROCEDURES Procedures  ____________________________________________    CLINICAL IMPRESSION / ASSESSMENT AND PLAN / ED COURSE  Medications ordered in the ED: Medications  ondansetron (ZOFRAN-ODT) disintegrating tablet 8 mg (8 mg Oral Given 01/03/20 1434)  famotidine (PEPCID) tablet 40 mg (40 mg Oral Given 01/03/20 1434)  propranolol (INDERAL) tablet 20 mg (20 mg Oral Given 01/03/20 1426)    Pertinent labs & imaging results that were available during my care of the patient were reviewed by me and considered in my medical decision making (see chart for details).   Zachary L Pecore Jr. was evaluated in Emergency Department on 01/03/2020 for the symptoms described in the history of present illness. He was evaluated in the context of the global COVID-19 pandemic, which necessitated consideration that the patient might be at risk for infection with the SARS-CoV-2 virus that causes COVID-19. Institutional protocols and algorithms that pertain to the evaluation of patients at risk for COVID-19 are in a state of rapid change based on information released by regulatory bodies including the CDC and federal and state organizations. These policies and algorithms were followed during the patient's care in  the ED.   Patient presents with alcohol intoxication.  Labs reveal a serum ethanol level of 357, UDS positive for benzos.  Chemistry and CBC unremarkable.  He does not appear to be seriously dehydrated.  He is intoxicated and coping with grief, but no symptoms of severe depression, not a danger to himself or others.    Will observe in the ED until clinically sober at which point he can be discharged.  Given propranolol for his grief and stress reaction, Pepcid and Zofran for heavy alcohol intake.  ----------------------------------------- 2:55 PM on 01/03/2020 -----------------------------------------  Patient sitting at the edge of the bed.  He is clearly intoxicated and not steady.  Informed him that he will need to stay in the ED until sober, and he is compliant and helped place himself back in the bed.  He request something to eat.  He lacks medical decision-making capacity to leave on his own right now, but does not appear to require commitment or chemical sedation at this time.      ____________________________________________   FINAL CLINICAL IMPRESSION(S) / ED DIAGNOSES    Final diagnoses:  Alcoholic intoxication without complication (HCC)  Grief     ED Discharge Orders    None      Portions of this note were generated with dragon dictation software. Dictation errors may occur despite best attempts at proofreading.   Sharman Cheek, MD 01/03/20 [phone]

## 2020-01-03 NOTE — ED Notes (Signed)
IVC, awaiting TTS consult and medical clearance before Psych consult will  be entered

## 2020-01-04 LAB — CSF CULTURE W GRAM STAIN: Culture: NO GROWTH

## 2020-01-04 LAB — SARS CORONAVIRUS 2 BY RT PCR (HOSPITAL ORDER, PERFORMED IN ~~LOC~~ HOSPITAL LAB): SARS Coronavirus 2: NEGATIVE

## 2020-01-04 NOTE — BH Assessment (Addendum)
2:40 P.M.: Attempted to assess pt however pt was still under the influence of alcohol. Pt unable to complete interview at this time.

## 2020-01-04 NOTE — BH Assessment (Signed)
Attempted to assess patient for the second time, patient unable to complete assessment, he continues to be sleepy and drowsy. Will attempt at a later time.

## 2020-01-04 NOTE — ED Notes (Signed)
Patient assisted to toilet by this RN and Beth NT. Patient had unsteady gait, placed in wheelchair.

## 2020-01-04 NOTE — ED Notes (Signed)
Hourly rounding reveals patient sleeping in room. No complaints, stable, in no acute distress. Q15 minute rounds and monitoring via Rover and Officer to continue.  

## 2020-01-04 NOTE — ED Notes (Signed)
Report to include Situation, Background, Assessment, and Recommendations received from Liz RN. Patient alert and oriented, warm and dry, in no acute distress. Patient denies SI, HI, AVH and pain. Patient made aware of Q15 minute rounds and Rover and Officer presence for their safety. Patient instructed to come to me with needs or concerns.  

## 2020-01-05 MED ORDER — CHLORDIAZEPOXIDE HCL 10 MG PO CAPS
ORAL_CAPSULE | ORAL | 0 refills | Status: DC
Start: 2020-01-05 — End: 2020-02-13

## 2020-01-05 MED ORDER — IBUPROFEN 600 MG PO TABS
600.0000 mg | ORAL_TABLET | Freq: Four times a day (QID) | ORAL | Status: DC | PRN
  Administered 2020-01-05: 600 mg via ORAL
  Filled 2020-01-05: qty 1

## 2020-01-05 NOTE — Consult Note (Signed)
Client seen by this provider for release of IVC for aggressive behaviors last night when he came to the ED under the influence of alcohol.  Today, he is clear, coherent, calm with no suicidal/homicidal ideations, hallucinations, or withdrawal symptoms.  Not interested in detox or rehab at this time, encouraged him to use his outpatient resources provided by TTS.  Reports, "My father died two days ago.  I got drunk and showed my ass.  It won't happen again."  Nanine Means, PMHNP

## 2020-01-05 NOTE — ED Notes (Signed)
Hourly rounding reveals patient sleeping in room. No complaints, stable, in no acute distress. Q15 minute rounds and monitoring via Rover and Officer to continue.  

## 2020-01-05 NOTE — BH Assessment (Addendum)
This Clinical research associate spoke with patient's wife ([phone]). Wife reported that she dropped pt off after he'd consume 1/5 of liquor within an hour. Wife stated that the patient's mother died 5 years ago and he was very close to his mother. Additionally the wife confirmed that the pt's father had died of liver cancer on the date of patient's ED admission. Wife reports she doesn't live with patient, but did not feel comfortable leaving the patient in that state. Wife explained that the patient was hitting himself on his body and in his head. Wife explained that patient has a pattern of staying sober for approximately a month and then the pt. relapses. The wife reported that once the patient has one drink, he is unable to stop. Wife reported a hx of multiple hospitalizations; 10 being in the last 6 months. Wife explained that the patient's drinking is impacting the patient's relationships and ability to maintain employment. The patient is currently unemployed. The wife reported that the patient has been drinking since age 56-12 years of age. Wife explained that the patient has only tried detox and has not tried any other form of treatment for his alcohol abuse.   9:52 am: Baxter Hire called back to report that the pt presented to the ED a couple of days ago and had a bulge/abnormality on his head. The patient was advised to stay for observation, however the patient was opposed and left the hospital.

## 2020-01-05 NOTE — BH Assessment (Signed)
Pt expressed desire to try sober living in the context of his home environment stating, "All I need is my kids and I'll stop drinking. This Clinical research associate discussed the risks and benefits of substance abuse treatment with the patient. Patient verbalized an understanding of the information provided. Pt was provided with substance abuse tx and grief counseling resources prior to discharge.

## 2020-01-05 NOTE — ED Notes (Signed)
Pt given clean scrubs and supplies to shower.

## 2020-01-05 NOTE — BH Assessment (Signed)
Assessment Note  Zachary Mendoza. is an 29 y.o. male who presents to the ER initially due to anxiety and alcohol intoxication. Upon arrival his BAC was 357. While in the ER he became agitated and irritable and required IM medication help manage his behaviors so he and others could remain safe. Patient states, he doesn't remember the specifics of why he was brought to the ER but "I'm pretty sure my wife brought me." He further explained, his father recently passed and he drank a "fifth of liquor" to cope with it. He states, he drinks this amount once a month, "if that."   He was prescribed an antidepressant approximately a year ago, by his PCP. It was because he was feeling sad, due to job loss. He has had no other mental health treatment, other than that. He was inpatient with [facility], approximately six months ago, for alcohol detox. He states, he wasn't giving any follow up outpatient treatment.  During the interview, the patient was calm, cooperative and pleasant. He was able to provide appropriate answers to the questions. He denies the use of any other mind-altering substances, besides alcohol. Throughout the interview, he denied SI/HI and AV/H. He reports of having no past thoughts, gestures or attempts to end his life.  Diagnosis: Alcohol Use Disorder  Past Medical History:  Past Medical History:  Diagnosis Date  . Hypertension   . Panic attacks     Past Surgical History:  Procedure Laterality Date  . ESOPHAGOGASTRODUODENOSCOPY (EGD) WITH PROPOFOL N/A 10/04/2019   Procedure: ESOPHAGOGASTRODUODENOSCOPY (EGD) WITH PROPOFOL;  Surgeon: Midge Minium, MD;  Location: [facility] ENDOSCOPY;  Service: Endoscopy;  Laterality: N/A;    Family History:  Family History  Problem Relation Age of Onset  . Hypertension Mother     Social History:  reports that he has been smoking cigarettes. He has been smoking about 1.00 pack per day. He has never used smokeless tobacco. He reports previous alcohol  use. He reports previous drug use. Drug: Methamphetamines.  Additional Social History:  Alcohol / Drug Use Pain Medications: See PTA Prescriptions: See PTA Over the Counter: See PTA History of alcohol / drug use?: Yes Longest period of sobriety (when/how long): Unable to quantify Substance #1 Name of Substance 1: Alcohol 1 - Age of First Use: 13 1 - Amount (size/oz): "About a 5th" 1 - Frequency: "Once every month, if that." 1 - Duration: Unable to quantify 1 - Last Use / Amount: 01/03/2020  CIWA: CIWA-Ar BP: (!) 134/95 Pulse Rate: 94 Nausea and Vomiting: no nausea and no vomiting Tactile Disturbances: none Tremor: no tremor Auditory Disturbances: not present Paroxysmal Sweats: no sweat visible Visual Disturbances: not present Anxiety: no anxiety, at ease Headache, Fullness in Head: none present Agitation: normal activity Orientation and Clouding of Sensorium:  (sleeping ) CIWA-Ar Total: 6 COWS:    Allergies:  Allergies  Allergen Reactions  . Penicillins Hives    Pt states he is not allergic, but that his sister was.    Home Medications: (Not in a hospital admission)   OB/GYN Status:  No LMP for male patient.  General Assessment Data Assessment unable to be completed: Yes Reason for Not Completing Assessment: Patient is unable to participate in the assessment Location of Assessment: [facility] ED TTS Assessment: In system Is this a Tele or Face-to-Face Assessment?: Face-to-Face Is this an Initial Assessment or a Re-assessment for this encounter?: Initial Assessment Patient Accompanied by:: N/A Language Other than English: No Living Arrangements: Other (Comment) (Private Home)  What gender do you identify as?: Male Date Telepsych consult ordered in [facility]: 01/03/20 Time Telepsych consult ordered in [facility]: 1646 Marital status: Married Pregnancy Status: No Living Arrangements: Spouse/significant other, Children Can pt return to current living arrangement?: Yes Admission  Status: Involuntary Petitioner: ED Attending Is patient capable of signing voluntary admission?: No (Under IVC) Referral Source: Self/Family/Friend Insurance type: None  Medical Screening Exam [facility] Walk-in ONLY) Medical Exam completed: Yes  Crisis Care Plan Living Arrangements: Spouse/significant other, Children Legal Guardian: Other: (Self) Name of Psychiatrist: None reported  Name of Therapist: None reported   Education Status Is patient currently in school?: No Is the patient employed, unemployed or receiving disability?: Unemployed  Risk to self with the past 6 months Suicidal Ideation: No Has patient been a risk to self within the past 6 months prior to admission? : No Suicidal Intent: No Has patient had any suicidal intent within the past 6 months prior to admission? : No Is patient at risk for suicide?: No Suicidal Plan?: No Has patient had any suicidal plan within the past 6 months prior to admission? : No Access to Means: No What has been your use of drugs/alcohol within the last 12 months?: Alcohol Previous Attempts/Gestures: No How many times?: 0 Other Self Harm Risks: Alcohol abuse Triggers for Past Attempts: None known Intentional Self Injurious Behavior: None Family Suicide History: Unknown Recent stressful life event(s): Loss (Comment), Other (Comment), Financial Problems, Job Loss Persecutory voices/beliefs?: No Depression: Yes Depression Symptoms: Feeling worthless/self pity Substance abuse history and/or treatment for substance abuse?: Yes Suicide prevention information given to non-admitted patients: Not applicable  Risk to Others within the past 6 months Homicidal Ideation: No Does patient have any lifetime risk of violence toward others beyond the six months prior to admission? : No Thoughts of Harm to Others: No Current Homicidal Intent: No Current Homicidal Plan: No Access to Homicidal Means: No Identified Victim: Reports of none History of  harm to others?: No Assessment of Violence: None Noted Violent Behavior Description: Reports of none Does patient have access to weapons?: No Criminal Charges Pending?: No Does patient have a court date: No Is patient on probation?: No  Psychosis Hallucinations: None noted Delusions: None noted  Mental Status Report Appearance/Hygiene: Unremarkable, In scrubs Eye Contact: Good Motor Activity: Freedom of movement, Unremarkable Speech: Logical/coherent, Unremarkable Level of Consciousness: Alert Mood: Pleasant Affect: Appropriate to circumstance Anxiety Level: None Thought Processes: Coherent, Relevant Judgement: Unimpaired Orientation: Person, Place, Time, Situation, Appropriate for developmental age Obsessive Compulsive Thoughts/Behaviors: None  Cognitive Functioning Concentration: Normal Memory: Remote Intact, Recent Impaired Is patient IDD: No Insight: Fair Impulse Control: Fair Appetite: Good Have you had any weight changes? : No Change Sleep: No Change Total Hours of Sleep: 8 Vegetative Symptoms: None  ADLScreening [facility] Assessment Services) Patient's cognitive ability adequate to safely complete daily activities?: Yes Patient able to express need for assistance with ADLs?: Yes Independently performs ADLs?: Yes (appropriate for developmental age)  Prior Inpatient Therapy Prior Inpatient Therapy: Yes Prior Therapy Dates: 06/2019 Prior Therapy Facilty/Provider(s): [facility] Reason for Treatment: Alcohol Detox & Treatment  Prior Outpatient Therapy Prior Outpatient Therapy: No Does patient have an ACCT team?: No Does patient have Intensive In-House Services?  : No Does patient have [facility] services? : No Does patient have P4CC services?: No  ADL Screening (condition at time of admission) Patient's cognitive ability adequate to safely complete daily activities?: Yes Is the patient deaf or have difficulty hearing?: No Does the patient have difficulty  seeing, even when wearing glasses/contacts?: No Does the patient have difficulty concentrating, remembering, or making decisions?: No Patient able to express need for assistance with ADLs?: Yes Does the patient have difficulty dressing or bathing?: No Independently performs ADLs?: Yes (appropriate for developmental age) Does the patient have difficulty walking or climbing stairs?: No Weakness of Legs: None Weakness of Arms/Hands: None  Home Assistive Devices/Equipment Home Assistive Devices/Equipment: None  Therapy Consults (therapy consults require a physician order) PT Evaluation Needed: No OT Evalulation Needed: No SLP Evaluation Needed: No Abuse/Neglect Assessment (Assessment to be complete while patient is alone) Abuse/Neglect Assessment Can Be Completed: Yes Physical Abuse: Denies Verbal Abuse: Denies Sexual Abuse: Denies Exploitation of patient/patient's resources: Denies Self-Neglect: Denies Values / Beliefs Cultural Requests During Hospitalization: None Spiritual Requests During Hospitalization: None Consults Spiritual Care Consult Needed: No Transition of Care Team Consult Needed: No Advance Directives (For Healthcare) Does Patient Have a Medical Advance Directive?: No Would patient like information on creating a medical advance directive?: No - Patient declined  Disposition:  Disposition Initial Assessment Completed for this Encounter: Yes  On Site Evaluation by:   Reviewed with Physician:    Lilyan Gilford MS, LCAS, [facility], NCC Therapeutic Triage Specialist 01/05/2020 5:09 AM

## 2020-01-05 NOTE — ED Notes (Signed)
Pt discharged home. VS stable. All belongings returned to patient. Pt signed for  discharge. Discharge instructions and prescription reviewed with patient.

## 2020-01-05 NOTE — ED Provider Notes (Signed)
-----------------------------------------   11:15 AM on 01/05/2020 -----------------------------------------  Patient has been seen and evaluated by psychiatry they believe the patient safe for discharge home from a psychiatric standpoint.  Patient is not interested in inpatient detox at the time.  We will discharge with a course of Librium per patient request.  We will also discharged with resources.  Patient's medical work-up is been largely nonrevealing besides elevated alcohol level.   Minna Antis, MD 01/05/20 1115

## 2020-01-05 NOTE — ED Notes (Signed)
Pt brought into ED BHU via sally port and wand with metal detector for safety by [place] Security officer. Patient oriented to unit/care area: Pt informed of unit policies and procedures.  Informed that, for their safety, care areas are designed for safety and monitored by security cameras at all times. Patient verbalizes understanding, and verbal contract for safety obtained.Pt shown to their room.  

## 2020-01-05 NOTE — Discharge Instructions (Signed)
You have been seen in the emergency department for a  psychiatric concern. You have been evaluated both medically as well as psychiatrically. Please follow-up with your outpatient resources provided. Return to the emergency department for any worsening symptoms, or any thoughts of hurting yourself or anyone else so that we may attempt to help you.  You have been prescribed Librium.  It is very important they do not drink alcohol while taking Librium.  If you are going to drink alcohol again please discontinue Librium immediately.  Please follow-up with resources provided to discuss detox if you feel that you need help.

## 2020-01-05 NOTE — BH Assessment (Signed)
Writer called and left a HIPPA Compliant message with wife ([phone]), requesting a return phone call.

## 2020-01-05 NOTE — ED Notes (Signed)
Pt given meal tray.

## 2020-01-10 ENCOUNTER — Other Ambulatory Visit: Payer: Self-pay

## 2020-01-10 ENCOUNTER — Emergency Department
Admission: EM | Admit: 2020-01-10 | Discharge: 2020-01-10 | Disposition: A | Discharge status: Home / Self Care | Payer: Self-pay | Attending: Emergency Medicine | Admitting: Emergency Medicine

## 2020-01-10 ENCOUNTER — Encounter: Payer: Self-pay | Admitting: Emergency Medicine

## 2020-01-10 DIAGNOSIS — F17210 Nicotine dependence, cigarettes, uncomplicated: Secondary | ICD-10-CM | POA: Insufficient documentation

## 2020-01-10 DIAGNOSIS — Z79899 Other long term (current) drug therapy: Secondary | ICD-10-CM | POA: Insufficient documentation

## 2020-01-10 DIAGNOSIS — I10 Essential (primary) hypertension: Secondary | ICD-10-CM | POA: Insufficient documentation

## 2020-01-10 DIAGNOSIS — K0889 Other specified disorders of teeth and supporting structures: Secondary | ICD-10-CM | POA: Insufficient documentation

## 2020-01-10 MED ORDER — OXYCODONE HCL 5 MG PO TABS
10.0000 mg | ORAL_TABLET | Freq: Once | ORAL | Status: AC
  Administered 2020-01-10: 10 mg via ORAL
  Filled 2020-01-10: qty 2

## 2020-01-10 MED ORDER — OXYCODONE-ACETAMINOPHEN 10-325 MG PO TABS: 1.0000 | ORAL_TABLET | Freq: Three times a day (TID) | ORAL | 0 refills | Status: AC | PRN

## 2020-01-10 NOTE — ED Triage Notes (Signed)
Pt presents to ED via POV with c/o L sided dental pain. Pt states has appt with dentist on Monday but can't wait and needs something for the pain

## 2020-01-10 NOTE — Discharge Instructions (Addendum)
Continue the antibiotic.  Take the pain medication sparingly and only for severe pain.  Use DenTemp to cover the open area until you see the dentist.

## 2020-01-10 NOTE — ED Provider Notes (Signed)
[facility] Emergency Department Provider Note ____________________________________________  Time seen: Approximately 8:08 PM  I have reviewed the triage vital signs and the nursing notes.   HISTORY  Chief Complaint Dental Pain   HPI Zachary Mendoza. is a 29 y.o. male   presenting to the emergency department for treatment and evaluation of dental pain.  Pain has been present off and on for the past several days.  He is currently on an antibiotic and has a follow-up appointment scheduled this coming Monday with his dentist.  Past Medical History:  Diagnosis Date  . Hypertension   . Panic attacks     Patient Active Problem List   Diagnosis Date Noted  . Leukocytosis 10/04/2019  . Abnormal LFTs 10/04/2019  . Hematemesis 10/04/2019  . Nausea vomiting and diarrhea 10/04/2019  . Hypertension   . Tobacco abuse   . Abdominal pain   . Chest pain   . Esophagitis   . Alcohol abuse 03/29/2019  . Severe anxiety with panic 03/28/2019  . Alcoholic intoxication with complication [facility])     Past Surgical History:  Procedure Laterality Date  . ESOPHAGOGASTRODUODENOSCOPY (EGD) WITH PROPOFOL N/A 10/04/2019   Procedure: ESOPHAGOGASTRODUODENOSCOPY (EGD) WITH PROPOFOL;  Surgeon: Midge Minium, MD;  Location: [facility] ENDOSCOPY;  Service: Endoscopy;  Laterality: N/A;    Prior to Admission medications   Medication Sig Start Date End Date Taking? Authorizing Provider  busPIRone (BUSPAR) 7.5 MG tablet Take 1 tablet (7.5 mg total) by mouth 2 (two) times daily. Patient not taking: Reported on 01/04/2020 10/06/19   Esaw Grandchild A, DO  carvedilol (COREG) 6.25 MG tablet Take 1 tablet (6.25 mg total) by mouth 2 (two) times daily with a meal. Patient not taking: Reported on 01/04/2020 03/30/19   Clapacs, Jackquline Denmark, MD  chlordiazePOXIDE (LIBRIUM) 10 MG capsule Day 1-2: Take 1 tablet p.o. every 8 hours. Day 3-4: Take 1 tablet p.o. every 12 hours. Day 5-6: Take 1 tablet p.o. daily. Day  7: Stop 01/05/20   Minna Antis, MD  escitalopram (LEXAPRO) 20 MG tablet Take 20 mg by mouth daily. Patient not taking: Reported on 01/04/2020 09/21/19   [provider]  hydrOXYzine (VISTARIL) 50 MG capsule Take 1 capsule (50 mg total) by mouth every 6 (six) hours as needed (anxiety/panic attack). Patient not taking: Reported on 01/04/2020 10/06/19   Esaw Grandchild A, DO  Multiple Vitamin (MULTIVITAMIN WITH MINERALS) TABS tablet Take 1 tablet by mouth daily. Patient not taking: Reported on 01/04/2020 10/07/19   Pennie Banter, DO  oxyCODONE-acetaminophen (PERCOCET) 10-325 MG tablet Take 1 tablet by mouth every 8 (eight) hours as needed for up to 5 days for pain. 01/10/20 01/15/20  Daryus Sowash, Rulon Eisenmenger B, FNP  pantoprazole (PROTONIX) 40 MG tablet Take 1 tablet (40 mg total) by mouth 2 (two) times daily. Patient not taking: Reported on 01/04/2020 10/06/19   Esaw Grandchild A, DO  phenol (CHLORASEPTIC) 1.4 % LIQD Use as directed 1 spray in the mouth or throat as needed for throat irritation / pain. 10/06/19   Pennie Banter, DO  QUEtiapine (SEROQUEL) 25 MG tablet Take 25 mg by mouth 2 (two) times daily as needed. Patient not taking: Reported on 01/04/2020 11/02/19   [provider]  sucralfate (CARAFATE) 1 GM/10ML suspension Take 10 mLs (1 g total) by mouth 4 (four) times daily -  with meals and at bedtime. Patient not taking: Reported on 01/04/2020 10/06/19   Esaw Grandchild A, DO    Allergies Penicillins  Family  History  Problem Relation Age of Onset  . Hypertension Mother     Social History Social History   Tobacco Use  . Smoking status: Current Every Day Smoker    Packs/day: 1.00    Types: Cigarettes  . Smokeless tobacco: Never Used  Vaping Use  . Vaping Use: Never used  Substance Use Topics  . Alcohol use: Not Currently    Comment: about week ago  . Drug use: Not Currently    Types: Methamphetamines    Comment: 1x this am    Review of Systems Constitutional:  Negative for fever or recent illness. ENT: Positive for dental pain. Musculoskeletal: Negative for trismus of the jaw.  Skin: Negative for wound or lesion. ____________________________________________   PHYSICAL EXAM:  VITAL SIGNS: ED Triage Vitals  Enc Vitals Group     BP 01/10/20 1806 (!) 166/117     Pulse Rate 01/10/20 1806 (!) 108     Resp --      Temp 01/10/20 1806 99.1 F (37.3 C)     Temp Source 01/10/20 1806 Oral     SpO2 01/10/20 1806 98 %     Weight 01/10/20 1735 (!) 230 lb (104.3 kg)     Height 01/10/20 1735 5\' 11"  (1.803 m)     Head Circumference --      Peak Flow --      Pain Score 01/10/20 1735 9     Pain Loc --      Pain Edu? --      Excl. in GC? --     Constitutional: Alert and oriented. Well appearing and in no acute distress. Eyes: Conjunctiva are clear without discharge or drainage. Mouth/Throat: Airway patent. Periodontal Exam    Hematological/Lymphatic/Immunilogical: No palpable cervical adenopathy. Respiratory: Respirations even and unlabored. Musculoskeletal: Full ROM of the jaw. Neurologic: Awake, alert, oriented.  Skin:  No facial swelling associated with area of dental pain. Psychiatric: Affect and behavior intact.  ____________________________________________   LABS (all labs ordered are listed, but only abnormal results are displayed)  Labs Reviewed - No data to display ____________________________________________   RADIOLOGY  Not indicated. ____________________________________________   PROCEDURES  Procedure(s) performed:   Procedures  Critical Care performed: No ____________________________________________   INITIAL IMPRESSION / ASSESSMENT AND PLAN / ED COURSE  Zachary Mendoza [birth year]. is a 29 y.o. male presents to the emergency department for treatment and evaluation of dental pain. He is currently on antibiotic. He will be treated with a brief course of Percocet and advised to continue the antibiotic. He was encouraged to  also get DenTemp to cover the area. He is to keep his scheduled dental appointment. For acute changes until Monday, he is to return to the ER.  Pertinent labs & imaging results that were available during my care of the patient were reviewed by me and considered in my medical decision making (see chart for details).  ____________________________________________   FINAL CLINICAL IMPRESSION(S) / ED DIAGNOSES  Final diagnoses:  Pain, dental    Discharge Medication List as of 01/10/2020  8:31 PM    START taking these medications   Details  oxyCODONE-acetaminophen (PERCOCET) 10-325 MG tablet Take 1 tablet by mouth every 8 (eight) hours as needed for up to 5 days for pain., Starting Thu 01/10/2020, Until Tue 01/15/2020 at 2359, Normal        If controlled substance prescribed during this visit, 12 month history viewed on the [facility] prior to issuing an initial prescription for Schedule II or III  opiod.  Note:  This document was prepared using Dragon voice recognition software and may include unintentional dictation errors.   Chinita Pester, FNP 01/11/20 2123    Chesley Noon, MD 01/14/20 [id number]

## 2020-01-16 ENCOUNTER — Other Ambulatory Visit: Payer: Self-pay

## 2020-01-16 ENCOUNTER — Encounter: Payer: Self-pay | Admitting: Emergency Medicine

## 2020-01-16 DIAGNOSIS — I10 Essential (primary) hypertension: Secondary | ICD-10-CM | POA: Insufficient documentation

## 2020-01-16 DIAGNOSIS — K029 Dental caries, unspecified: Secondary | ICD-10-CM | POA: Insufficient documentation

## 2020-01-16 DIAGNOSIS — Z79899 Other long term (current) drug therapy: Secondary | ICD-10-CM | POA: Insufficient documentation

## 2020-01-16 DIAGNOSIS — F17210 Nicotine dependence, cigarettes, uncomplicated: Secondary | ICD-10-CM | POA: Insufficient documentation

## 2020-01-16 MED ORDER — LIDOCAINE VISCOUS HCL 2 % MT SOLN
15.0000 mL | Freq: Once | OROMUCOSAL | Status: AC
  Administered 2020-01-17: 15 mL via OROMUCOSAL
  Filled 2020-01-16: qty 15

## 2020-01-16 NOTE — ED Triage Notes (Signed)
Pt c/o left lower dental pain. Pt crying with pain. Pt reports he was to have procedure but MD rescheduled.

## 2020-01-17 ENCOUNTER — Emergency Department
Admission: EM | Admit: 2020-01-17 | Discharge: 2020-01-17 | Disposition: A | Discharge status: Home / Self Care | Payer: Self-pay | Attending: Student in an Organized Health Care Education/Training Program | Admitting: Student in an Organized Health Care Education/Training Program

## 2020-01-17 DIAGNOSIS — K029 Dental caries, unspecified: Secondary | ICD-10-CM

## 2020-01-17 MED ORDER — HYDROCODONE-ACETAMINOPHEN 5-325 MG PO TABS: 1.0000 | ORAL_TABLET | Freq: Four times a day (QID) | ORAL | 0 refills | Status: DC | PRN

## 2020-01-17 MED ORDER — CLINDAMYCIN HCL 150 MG PO CAPS
ORAL_CAPSULE | ORAL | 0 refills | Status: DC
Start: 2020-01-17 — End: 2020-02-13

## 2020-01-17 NOTE — Discharge Instructions (Addendum)
Go to the walk-in clinic at [facility].  Their walk-in hours in [facility] and [place] are 8:30 AM and 1:00 PM.  The [place] [facility] in [place] also takes walk-ins on Friday.  Plan to go to 1 of these locations.  Begin taking clindamycin and a prescription for pain medication for 1 day was sent to your pharmacy.  Discontinue smoking at this time as it will cause your teeth to hurt worse.

## 2020-01-17 NOTE — ED Provider Notes (Signed)
[facility] Emergency Department Provider Note  ____________________________________________   None    (approximate)  I have reviewed the triage vital signs and the nursing notes.   HISTORY  Chief Complaint Dental Pain   HPI Zachary Mendoza. is a 29 y.o. male resents to the ED with complaint of left lower dental pain.  Patient states that he was scheduled to see his dentist this past Monday but got a phone call canceling his appointment and rescheduled for 5 days from now.  Patient was seen in the ED on 01/10/2020.  He states he took penicillin last month and continues to have pain.  Patient rates his pain as 7 out of 10.  Patient continues to smoke.       Past Medical History:  Diagnosis Date  . Hypertension   . Panic attacks     Patient Active Problem List   Diagnosis Date Noted  . Leukocytosis 10/04/2019  . Abnormal LFTs 10/04/2019  . Hematemesis 10/04/2019  . Nausea vomiting and diarrhea 10/04/2019  . Hypertension   . Tobacco abuse   . Abdominal pain   . Chest pain   . Esophagitis   . Alcohol abuse 03/29/2019  . Severe anxiety with panic 03/28/2019  . Alcoholic intoxication with complication [facility])     Past Surgical History:  Procedure Laterality Date  . ESOPHAGOGASTRODUODENOSCOPY (EGD) WITH PROPOFOL N/A 10/04/2019   Procedure: ESOPHAGOGASTRODUODENOSCOPY (EGD) WITH PROPOFOL;  Surgeon: Midge Minium, MD;  Location: [facility] ENDOSCOPY;  Service: Endoscopy;  Laterality: N/A;    Prior to Admission medications   Medication Sig Start Date End Date Taking? Authorizing Provider  busPIRone (BUSPAR) 7.5 MG tablet Take 1 tablet (7.5 mg total) by mouth 2 (two) times daily. Patient not taking: Reported on 01/04/2020 10/06/19   Esaw Grandchild A, DO  carvedilol (COREG) 6.25 MG tablet Take 1 tablet (6.25 mg total) by mouth 2 (two) times daily with a meal. Patient not taking: Reported on 01/04/2020 03/30/19   Clapacs, Jackquline Denmark, MD  chlordiazePOXIDE (LIBRIUM)  10 MG capsule Day 1-2: Take 1 tablet p.o. every 8 hours. Day 3-4: Take 1 tablet p.o. every 12 hours. Day 5-6: Take 1 tablet p.o. daily. Day 7: Stop 01/05/20   Minna Antis, MD  clindamycin (CLEOCIN) 150 MG capsule 2 caps tid until finished 01/17/20   Bridget Hartshorn L, PA-C  escitalopram (LEXAPRO) 20 MG tablet Take 20 mg by mouth daily. Patient not taking: Reported on 01/04/2020 09/21/19   [provider]  HYDROcodone-acetaminophen (NORCO/VICODIN) 5-325 MG tablet Take 1 tablet by mouth every 6 (six) hours as needed for moderate pain. 01/17/20   Tommi Rumps, PA-C  hydrOXYzine (VISTARIL) 50 MG capsule Take 1 capsule (50 mg total) by mouth every 6 (six) hours as needed (anxiety/panic attack). Patient not taking: Reported on 01/04/2020 10/06/19   Esaw Grandchild A, DO  Multiple Vitamin (MULTIVITAMIN WITH MINERALS) TABS tablet Take 1 tablet by mouth daily. Patient not taking: Reported on 01/04/2020 10/07/19   Esaw Grandchild A, DO  pantoprazole (PROTONIX) 40 MG tablet Take 1 tablet (40 mg total) by mouth 2 (two) times daily. Patient not taking: Reported on 01/04/2020 10/06/19   Esaw Grandchild A, DO  phenol (CHLORASEPTIC) 1.4 % LIQD Use as directed 1 spray in the mouth or throat as needed for throat irritation / pain. 10/06/19   Pennie Banter, DO  QUEtiapine (SEROQUEL) 25 MG tablet Take 25 mg by mouth 2 (two) times daily as needed. Patient not taking: Reported  on 01/04/2020 11/02/19   [provider]  sucralfate (CARAFATE) 1 GM/10ML suspension Take 10 mLs (1 g total) by mouth 4 (four) times daily -  with meals and at bedtime. Patient not taking: Reported on 01/04/2020 10/06/19   Esaw Grandchild A, DO    Allergies Penicillins  Family History  Problem Relation Age of Onset  . Hypertension Mother     Social History Social History   Tobacco Use  . Smoking status: Current Every Day Smoker    Packs/day: 1.00    Types: Cigarettes  . Smokeless tobacco: Never Used  Vaping Use    . Vaping Use: Never used  Substance Use Topics  . Alcohol use: Not Currently    Comment: about week ago  . Drug use: Not Currently    Types: Methamphetamines    Comment: 1x this am    Review of Systems Constitutional: No fever/chills Eyes: No visual changes. ENT: Positive for dental pain. Cardiovascular: Denies chest pain. Respiratory: Denies shortness of breath. Gastrointestinal:   No nausea, no vomiting.  Musculoskeletal: Negative for back pain. Skin: Negative for rash. Neurological: Negative for headaches, focal weakness or numbness. ____________________________________________   PHYSICAL EXAM:  VITAL SIGNS: ED Triage Vitals  Enc Vitals Group     BP 01/16/20 2318 (!) 159/102     Pulse Rate 01/16/20 2318 (!) 111     Resp 01/16/20 2318 17     Temp 01/16/20 2318 98.8 F (37.1 C)     Temp Source 01/16/20 2318 Oral     SpO2 01/16/20 2318 98 %     Weight --      Height --      Head Circumference --      Peak Flow --      Pain Score 01/17/20 0209 7     Pain Loc --      Pain Edu? --      Excl. in GC? --    Constitutional: Alert and oriented. Well appearing and in no acute distress. Eyes: Conjunctivae are normal.  Head: Atraumatic. Nose: No congestion/rhinnorhea. Mouth/Throat: Mucous membranes are moist.  Oropharynx non-erythematous.  Left lower molar and premolar are in poor repair.  Gums are not edematous but tender to touch with a tongue blade.  No active drainage is noted. Neck: No stridor.   Hematological/Lymphatic/Immunilogical: No cervical lymphadenopathy. Cardiovascular: Normal rate, regular rhythm. Grossly normal heart sounds.  Good peripheral circulation. Respiratory: Normal respiratory effort.  No retractions. Lungs CTAB. Musculoskeletal: Moves upper and lower extremities they have difficulty normal gait was noted. Neurologic:  Normal speech and language. No gross focal neurologic deficits are appreciated.  Skin:  Skin is warm, dry and intact. No rash  noted. Psychiatric: Mood and affect are normal. Speech and behavior are normal.  ____________________________________________   LABS (all labs ordered are listed, but only abnormal results are displayed)  Labs Reviewed - No data to display  PROCEDURES  Procedure(s) performed (including Critical Care):  Procedures   ____________________________________________   INITIAL IMPRESSION / ASSESSMENT AND PLAN / ED COURSE  As part of my medical decision making, I reviewed the following data within the electronic MEDICAL RECORD NUMBER Notes from prior ED visits and [place] Controlled Substance Database  Arthor L Moralez Jr. was evaluated in Emergency Department on 01/17/2020 for the symptoms described in the history of present illness. He was evaluated in the context of the global COVID-19 pandemic, which necessitated consideration that the patient might be at risk for infection with the SARS-CoV-2 virus  that causes COVID-19. Institutional protocols and algorithms that pertain to the evaluation of patients at risk for COVID-19 are in a state of rapid change based on information released by regulatory bodies including the CDC and federal and state organizations. These policies and algorithms were followed during the patient's care in the ED.  29 year old male presents to the ED with complaint of dental pain.  Patient has been seen previously on 01/10/2020 and placed on amoxicillin and hydrocodone.  Patient states that he had an appointment with his dentist on August 2 but this was canceled and rescheduled due to a conflict "with the dentist".  He has been rescheduled for August 9 but patient states that he is not sure that he can wait that long.  He was made aware that there is a walk-in clinic and the hours of operation were given to him with that information.  Patient states that he plans to go to the walk-in clinic.  Patient was given a prescription for clindamycin for the next 7 days along with hydrocodone for  the next 2 days if needed for pain.  Patient is encouraged to go to the walk-in clinic for treatment and repair.  ____________________________________________   FINAL CLINICAL IMPRESSION(S) / ED DIAGNOSES  Final diagnoses:  Pain due to dental caries     ED Discharge Orders         Ordered    HYDROcodone-acetaminophen (NORCO/VICODIN) 5-325 MG tablet  Every 6 hours PRN     Discontinue  Reprint     01/17/20 0800    clindamycin (CLEOCIN) 150 MG capsule     Discontinue  Reprint     01/17/20 0800           Note:  This document was prepared using Dragon voice recognition software and may include unintentional dictation errors.    Tommi Rumps, PA-C 01/17/20 1140    Willy Eddy, MD 01/17/20 1306

## 2020-01-23 ENCOUNTER — Other Ambulatory Visit: Payer: Self-pay

## 2020-01-23 ENCOUNTER — Encounter: Payer: Self-pay | Admitting: Emergency Medicine

## 2020-01-23 ENCOUNTER — Emergency Department
Admission: EM | Admit: 2020-01-23 | Discharge: 2020-01-23 | Disposition: A | Discharge status: Home / Self Care | Payer: Self-pay | Attending: Emergency Medicine | Admitting: Emergency Medicine

## 2020-01-23 DIAGNOSIS — I10 Essential (primary) hypertension: Secondary | ICD-10-CM | POA: Insufficient documentation

## 2020-01-23 DIAGNOSIS — K047 Periapical abscess without sinus: Secondary | ICD-10-CM | POA: Insufficient documentation

## 2020-01-23 DIAGNOSIS — F17210 Nicotine dependence, cigarettes, uncomplicated: Secondary | ICD-10-CM | POA: Insufficient documentation

## 2020-01-23 MED ORDER — LIDOCAINE VISCOUS HCL 2 % MT SOLN
10.0000 mL | OROMUCOSAL | 0 refills | Status: DC | PRN
Start: 2020-01-23 — End: 2020-02-13

## 2020-01-23 MED ORDER — DOXYCYCLINE HYCLATE 100 MG PO TABS
100.0000 mg | ORAL_TABLET | Freq: Two times a day (BID) | ORAL | 0 refills | Status: DC
Start: 2020-01-23 — End: 2020-02-13

## 2020-01-23 MED ORDER — OXYCODONE-ACETAMINOPHEN 10-325 MG PO TABS: 1.0000 | ORAL_TABLET | Freq: Four times a day (QID) | ORAL | 0 refills | Status: DC | PRN

## 2020-01-23 MED ORDER — LIDOCAINE-EPINEPHRINE 2 %-1:100000 IJ SOLN
1.7000 mL | Freq: Once | INTRAMUSCULAR | Status: AC
  Administered 2020-01-23: 1.7 mL
  Filled 2020-01-23: qty 1.7

## 2020-01-23 MED ORDER — OXYCODONE-ACETAMINOPHEN 5-325 MG PO TABS
1.0000 | ORAL_TABLET | Freq: Once | ORAL | Status: AC
  Administered 2020-01-23: 1 via ORAL
  Filled 2020-01-23: qty 1

## 2020-01-23 NOTE — ED Provider Notes (Signed)
[facility] Emergency Department Provider Note  ____________________________________________  Time seen: Approximately 8:18 PM  I have reviewed the triage vital signs and the nursing notes.   HISTORY  Chief Complaint Dental Pain    HPI Zachary Mendoza Hageman. is a 29 y.o. male who presents emergency department complaining of severe left lower dental pain.  Patient states that his dentist is placed him on antibiotics and he is supposed to have an extraction in 4 days.  Patient states that the pain is unbearable and he is seeking pain relief.  No fevers or chills.  No difficulty breathing or swallowing.         Past Medical History:  Diagnosis Date  . Hypertension   . Panic attacks     Patient Active Problem List   Diagnosis Date Noted  . Leukocytosis 10/04/2019  . Abnormal LFTs 10/04/2019  . Hematemesis 10/04/2019  . Nausea vomiting and diarrhea 10/04/2019  . Hypertension   . Tobacco abuse   . Abdominal pain   . Chest pain   . Esophagitis   . Alcohol abuse 03/29/2019  . Severe anxiety with panic 03/28/2019  . Alcoholic intoxication with complication [facility])     Past Surgical History:  Procedure Laterality Date  . ESOPHAGOGASTRODUODENOSCOPY (EGD) WITH PROPOFOL N/A 10/04/2019   Procedure: ESOPHAGOGASTRODUODENOSCOPY (EGD) WITH PROPOFOL;  Surgeon: Midge Minium, MD;  Location: [facility] ENDOSCOPY;  Service: Endoscopy;  Laterality: N/A;    Prior to Admission medications   Medication Sig Start Date End Date Taking? Authorizing Provider  chlordiazePOXIDE (LIBRIUM) 10 MG capsule Day 1-2: Take 1 tablet p.o. every 8 hours. Day 3-4: Take 1 tablet p.o. every 12 hours. Day 5-6: Take 1 tablet p.o. daily. Day 7: Stop 01/05/20   Minna Antis, MD  clindamycin (CLEOCIN) 150 MG capsule 2 caps tid until finished 01/17/20   Tommi Rumps, PA-C  doxycycline (VIBRA-TABS) 100 MG tablet Take 1 tablet (100 mg total) by mouth 2 (two) times daily. 01/23/20   Gissel Keilman,  Delorise Royals, PA-C  HYDROcodone-acetaminophen (NORCO/VICODIN) 5-325 MG tablet Take 1 tablet by mouth every 6 (six) hours as needed for moderate pain. 01/17/20   Tommi Rumps, PA-C  lidocaine (XYLOCAINE) 2 % solution Use as directed 10 mLs in the mouth or throat every 4 (four) hours as needed for mouth pain. Swish and spit 01/23/20   Aniyia Rane, Delorise Royals, PA-C  oxyCODONE-acetaminophen (PERCOCET) 10-325 MG tablet Take 1 tablet by mouth every 6 (six) hours as needed for pain. 01/23/20 01/22/21  Dominiqua Cooner, Delorise Royals, PA-C  phenol (CHLORASEPTIC) 1.4 % LIQD Use as directed 1 spray in the mouth or throat as needed for throat irritation / pain. 10/06/19   Pennie Banter, DO    Allergies Penicillins  Family History  Problem Relation Age of Onset  . Hypertension Mother     Social History Social History   Tobacco Use  . Smoking status: Current Every Day Smoker    Packs/day: 1.00    Types: Cigarettes  . Smokeless tobacco: Never Used  Vaping Use  . Vaping Use: Never used  Substance Use Topics  . Alcohol use: Not Currently    Comment: about week ago  . Drug use: Not Currently    Types: Methamphetamines    Comment: 1x this am     Review of Systems  Constitutional: No fever/chills Eyes: No visual changes. No discharge ENT: Left lower dental pain Cardiovascular: no chest pain. Respiratory: no cough. No SOB. Gastrointestinal: No abdominal pain.  No  nausea, no vomiting.  No diarrhea.  No constipation. Musculoskeletal: Negative for musculoskeletal pain. Skin: Negative for rash, abrasions, lacerations, ecchymosis. Neurological: Negative for headaches, focal weakness or numbness. 10-point ROS otherwise negative.  ____________________________________________   PHYSICAL EXAM:  VITAL SIGNS: ED Triage Vitals  Enc Vitals Group     BP 01/23/20 1857 128/78     Pulse Rate 01/23/20 1857 88     Resp 01/23/20 1857 18     Temp 01/23/20 1857 98 F (36.7 C)     Temp Source 01/23/20 1857 Oral      SpO2 01/23/20 1857 99 %     Weight 01/23/20 1848 229 lb 4.5 oz (104 kg)     Height 01/23/20 1848 5\' 11"  (1.803 m)     Head Circumference --      Peak Flow --      Pain Score 01/23/20 1848 8     Pain Loc --      Pain Edu? --      Excl. in GC? --      Constitutional: Alert and oriented. Well appearing and in no acute distress. Eyes: Conjunctivae are normal. PERRL. EOMI. Head: Atraumatic. ENT:      Ears:       Nose: No congestion/rhinnorhea.      Mouth/Throat: Mucous membranes are moist.  Visualization of the left lower dentition reveals 2 teeth with significant erosion/caries.  Mild surrounding erythema.  No gross edema appreciated.  No evidence of drainable abscess.  Uvula is midline. Neck: No stridor.   Hematological/Lymphatic/Immunilogical: No cervical lymphadenopathy. Cardiovascular: Normal rate, regular rhythm. Normal S1 and S2.  Good peripheral circulation. Respiratory: Normal respiratory effort without tachypnea or retractions. Lungs CTAB. Good air entry to the bases with no decreased or absent breath sounds. Musculoskeletal: Full range of motion to all extremities. No gross deformities appreciated. Neurologic:  Normal speech and language. No gross focal neurologic deficits are appreciated.  Skin:  Skin is warm, dry and intact. No rash noted. Psychiatric: Mood and affect are normal. Speech and behavior are normal. Patient exhibits appropriate insight and judgement.   ____________________________________________   LABS (all labs ordered are listed, but only abnormal results are displayed)  Labs Reviewed - No data to display ____________________________________________  EKG   ____________________________________________  RADIOLOGY   No results found.  ____________________________________________    PROCEDURES  Procedure(s) performed:    .Nerve Block  Date/Time: 01/23/2020 9:16 PM Performed by: 03/24/2020, PA-C Authorized by: Racheal Patches, PA-C   Consent:    Consent obtained:  Verbal   Consent given by:  Patient   Risks discussed:  Pain and unsuccessful block Indications:    Indications:  Pain relief Location:    Body area:  Head   Head nerve blocked: Inferior Alveolar nerve.   Laterality:  Left Skin anesthesia (see MAR for exact dosages):    Skin anesthesia method:  None Procedure details (see MAR for exact dosages):    Block needle gauge:  27 G   Steroid injected:  None   Additive injected:  None   Injection procedure:  Anatomic landmarks identified, introduced needle, negative aspiration for blood and incremental injection Post-procedure details:    Dressing:  None   Outcome:  Pain relieved   Patient tolerance of procedure:  Tolerated well, no immediate complications      Medications  lidocaine-EPINEPHrine (XYLOCAINE W/EPI) 2 %-1:100000 (with pres) injection 1.7 mL (has no administration in time range)  oxyCODONE-acetaminophen (PERCOCET/ROXICET) 5-325 MG per tablet 1  tablet (1 tablet Oral Given 01/23/20 2051)     ____________________________________________   INITIAL IMPRESSION / ASSESSMENT AND PLAN / ED COURSE  Pertinent labs & imaging results that were available during my care of the patient were reviewed by me and considered in my medical decision making (see chart for details).  Review of the [place] CSRS was performed in accordance of the [facility] prior to dispensing any controlled drugs.           Patient's diagnosis is consistent with dental infection.  Patient presented to emergency department with left lower dental pain.  Patient is scheduled to have dental extraction on Wednesday.  He was complaining of increased pain.  Patient is taken 3 days of clindamycin with no improvement of his symptoms.  No fevers or chills.  No difficulty breathing or swallowing.  Patient was given dental block for symptom control here in the emergency department.  No gross findings concerning for deeper  underlying abscess requiring labs or imaging.  Patient will have an additional antibiotic added as he is not had improvement with clindamycin and I want to ensure improvement of any infections that patient can have dental extraction on Monday.  Patient was prescribed pain medication, viscous lidocaine for symptom improvement.  Follow-up with dentist at his scheduled appointment..Patient is given ED precautions to return to the ED for any worsening or new symptoms.     ____________________________________________  FINAL CLINICAL IMPRESSION(S) / ED DIAGNOSES  Final diagnoses:  Dental infection      NEW MEDICATIONS STARTED DURING THIS VISIT:  ED Discharge Orders         Ordered    oxyCODONE-acetaminophen (PERCOCET) 10-325 MG tablet  Every 6 hours PRN     Discontinue  Reprint     01/23/20 2114    doxycycline (VIBRA-TABS) 100 MG tablet  2 times daily     Discontinue  Reprint     01/23/20 2114    lidocaine (XYLOCAINE) 2 % solution  Every 4 hours PRN     Discontinue  Reprint     01/23/20 2114              This chart was dictated using voice recognition software/Dragon. Despite best efforts to proofread, errors can occur which can change the meaning. Any change was purely unintentional.    Racheal Patches, PA-C 01/23/20 2117    Arnaldo Natal, MD 01/23/20 [phone]

## 2020-01-23 NOTE — ED Triage Notes (Signed)
Presents with dental pain  States he has two teeth on the left that need to be pulled   Pt is tearful  States he has appt on Monday with g'mother's dentist

## 2020-02-07 ENCOUNTER — Inpatient Hospital Stay
Admission: EM | Admit: 2020-02-07 | Discharge: 2020-02-13 | DRG: 433 | Disposition: A | Discharge status: Home / Self Care | Payer: Self-pay | Attending: Family Medicine | Admitting: Family Medicine

## 2020-02-07 ENCOUNTER — Other Ambulatory Visit: Payer: Self-pay

## 2020-02-07 ENCOUNTER — Emergency Department: Payer: Self-pay

## 2020-02-07 ENCOUNTER — Inpatient Hospital Stay: Payer: Self-pay

## 2020-02-07 DIAGNOSIS — F10129 Alcohol abuse with intoxication, unspecified: Secondary | ICD-10-CM

## 2020-02-07 DIAGNOSIS — F17210 Nicotine dependence, cigarettes, uncomplicated: Secondary | ICD-10-CM | POA: Diagnosis present

## 2020-02-07 DIAGNOSIS — F10929 Alcohol use, unspecified with intoxication, unspecified: Secondary | ICD-10-CM

## 2020-02-07 DIAGNOSIS — R7989 Other specified abnormal findings of blood chemistry: Secondary | ICD-10-CM

## 2020-02-07 DIAGNOSIS — B179 Acute viral hepatitis, unspecified: Secondary | ICD-10-CM | POA: Diagnosis present

## 2020-02-07 DIAGNOSIS — K922 Gastrointestinal hemorrhage, unspecified: Secondary | ICD-10-CM

## 2020-02-07 DIAGNOSIS — F10230 Alcohol dependence with withdrawal, uncomplicated: Secondary | ICD-10-CM | POA: Diagnosis present

## 2020-02-07 DIAGNOSIS — B2700 Gammaherpesviral mononucleosis without complication: Secondary | ICD-10-CM | POA: Diagnosis present

## 2020-02-07 DIAGNOSIS — Z880 Allergy status to penicillin: Secondary | ICD-10-CM

## 2020-02-07 DIAGNOSIS — K921 Melena: Secondary | ICD-10-CM | POA: Diagnosis present

## 2020-02-07 DIAGNOSIS — K2970 Gastritis, unspecified, without bleeding: Secondary | ICD-10-CM | POA: Diagnosis present

## 2020-02-07 DIAGNOSIS — K209 Esophagitis, unspecified without bleeding: Secondary | ICD-10-CM | POA: Diagnosis present

## 2020-02-07 DIAGNOSIS — F411 Generalized anxiety disorder: Secondary | ICD-10-CM | POA: Diagnosis present

## 2020-02-07 DIAGNOSIS — Z8619 Personal history of other infectious and parasitic diseases: Secondary | ICD-10-CM

## 2020-02-07 DIAGNOSIS — I10 Essential (primary) hypertension: Secondary | ICD-10-CM | POA: Diagnosis present

## 2020-02-07 DIAGNOSIS — Z79899 Other long term (current) drug therapy: Secondary | ICD-10-CM

## 2020-02-07 DIAGNOSIS — F10229 Alcohol dependence with intoxication, unspecified: Secondary | ICD-10-CM | POA: Diagnosis present

## 2020-02-07 DIAGNOSIS — K0889 Other specified disorders of teeth and supporting structures: Secondary | ICD-10-CM | POA: Diagnosis present

## 2020-02-07 DIAGNOSIS — Z8249 Family history of ischemic heart disease and other diseases of the circulatory system: Secondary | ICD-10-CM

## 2020-02-07 DIAGNOSIS — Z20822 Contact with and (suspected) exposure to covid-19: Secondary | ICD-10-CM | POA: Diagnosis present

## 2020-02-07 DIAGNOSIS — Y908 Blood alcohol level of 240 mg/100 ml or more: Secondary | ICD-10-CM | POA: Diagnosis present

## 2020-02-07 DIAGNOSIS — K7010 Alcoholic hepatitis without ascites: Principal | ICD-10-CM | POA: Diagnosis present

## 2020-02-07 DIAGNOSIS — K759 Inflammatory liver disease, unspecified: Secondary | ICD-10-CM

## 2020-02-07 DIAGNOSIS — K760 Fatty (change of) liver, not elsewhere classified: Secondary | ICD-10-CM | POA: Diagnosis present

## 2020-02-07 DIAGNOSIS — F410 Panic disorder [episodic paroxysmal anxiety] without agoraphobia: Secondary | ICD-10-CM | POA: Diagnosis present

## 2020-02-07 DIAGNOSIS — R0789 Other chest pain: Secondary | ICD-10-CM

## 2020-02-07 DIAGNOSIS — K920 Hematemesis: Secondary | ICD-10-CM | POA: Diagnosis present

## 2020-02-07 DIAGNOSIS — E669 Obesity, unspecified: Secondary | ICD-10-CM | POA: Diagnosis present

## 2020-02-07 DIAGNOSIS — F10231 Alcohol dependence with withdrawal delirium: Secondary | ICD-10-CM | POA: Diagnosis present

## 2020-02-07 DIAGNOSIS — Z6839 Body mass index (BMI) 39.0-39.9, adult: Secondary | ICD-10-CM

## 2020-02-07 DIAGNOSIS — F909 Attention-deficit hyperactivity disorder, unspecified type: Secondary | ICD-10-CM | POA: Diagnosis present

## 2020-02-07 LAB — CBC
HCT: 49.8 % (ref 39.0–52.0)
Hemoglobin: 17.2 g/dL — ABNORMAL HIGH (ref 13.0–17.0)
MCH: 28.8 pg (ref 26.0–34.0)
MCHC: 34.5 g/dL (ref 30.0–36.0)
MCV: 83.3 fL (ref 80.0–100.0)
Platelets: 325 10*3/uL (ref 150–400)
RBC: 5.98 MIL/uL — ABNORMAL HIGH (ref 4.22–5.81)
RDW: 13.3 % (ref 11.5–15.5)
WBC: 7.5 10*3/uL (ref 4.0–10.5)
nRBC: 0 % (ref 0.0–0.2)

## 2020-02-07 LAB — COMPREHENSIVE METABOLIC PANEL
ALT: 1383 U/L — ABNORMAL HIGH (ref 0–44)
AST: 2070 U/L — ABNORMAL HIGH (ref 15–41)
Albumin: 4.4 g/dL (ref 3.5–5.0)
Alkaline Phosphatase: 80 U/L (ref 38–126)
Anion gap: 15 (ref 5–15)
BUN: 24 mg/dL — ABNORMAL HIGH (ref 6–20)
CO2: 24 mmol/L (ref 22–32)
Calcium: 8.6 mg/dL — ABNORMAL LOW (ref 8.9–10.3)
Chloride: 101 mmol/L (ref 98–111)
Creatinine, Ser: 0.97 mg/dL (ref 0.61–1.24)
GFR calc Af Amer: >60 mL/min (ref >60)
GFR calc non Af Amer: >60 mL/min (ref >60)
Glucose, Bld: 216 mg/dL — ABNORMAL HIGH (ref 70–99)
Potassium: 3.7 mmol/L (ref 3.5–5.1)
Sodium: 140 mmol/L (ref 135–145)
Total Bilirubin: 1 mg/dL (ref 0.3–1.2)
Total Protein: 7.7 g/dL (ref 6.5–8.1)

## 2020-02-07 LAB — APTT: aPTT: 33 seconds (ref 24–36)

## 2020-02-07 LAB — TROPONIN I (HIGH SENSITIVITY): Troponin I (High Sensitivity): 9 ng/L (ref <18)

## 2020-02-07 LAB — PROTIME-INR
INR: 1.5 — ABNORMAL HIGH (ref 0.8–1.2)
Prothrombin Time: 17.3 seconds — ABNORMAL HIGH (ref 11.4–15.2)

## 2020-02-07 LAB — ACETAMINOPHEN LEVEL: Acetaminophen (Tylenol), Serum: <10 ug/mL — ABNORMAL LOW (ref 10–30)

## 2020-02-07 LAB — SALICYLATE LEVEL: Salicylate Lvl: <7 mg/dL — ABNORMAL LOW (ref 7.0–30.0)

## 2020-02-07 LAB — ETHANOL: Alcohol, Ethyl (B): 356 mg/dL (ref <10)

## 2020-02-07 LAB — SARS CORONAVIRUS 2 BY RT PCR (HOSPITAL ORDER, PERFORMED IN ~~LOC~~ HOSPITAL LAB): SARS Coronavirus 2: NEGATIVE

## 2020-02-07 MED ORDER — THIAMINE HCL 100 MG PO TABS
100.0000 mg | ORAL_TABLET | Freq: Every day | ORAL | Status: DC
  Administered 2020-02-08 – 2020-02-09 (×2): 100 mg via ORAL
  Filled 2020-02-07 (×2): qty 1

## 2020-02-07 MED ORDER — ONDANSETRON HCL 4 MG/2ML IJ SOLN: 4.0000 mg | Freq: Four times a day (QID) | INTRAMUSCULAR | Status: DC | PRN

## 2020-02-07 MED ORDER — LOPERAMIDE HCL 2 MG PO CAPS: 2.0000 mg | ORAL_CAPSULE | ORAL | Status: AC | PRN

## 2020-02-07 MED ORDER — THIAMINE HCL 100 MG/ML IJ SOLN
100.0000 mg | Freq: Once | INTRAMUSCULAR | Status: DC
  Filled 2020-02-07: qty 2

## 2020-02-07 MED ORDER — OXYCODONE-ACETAMINOPHEN 5-325 MG PO TABS
1.0000 | ORAL_TABLET | Freq: Four times a day (QID) | ORAL | Status: DC | PRN
  Administered 2020-02-09 – 2020-02-10 (×2): 1 via ORAL
  Filled 2020-02-07 (×2): qty 1

## 2020-02-07 MED ORDER — LORAZEPAM 1 MG PO TABS
1.0000 mg | ORAL_TABLET | Freq: Four times a day (QID) | ORAL | Status: AC | PRN
  Filled 2020-02-07: qty 1

## 2020-02-07 MED ORDER — PHENOL 1.4 % MT LIQD
1.0000 | OROMUCOSAL | Status: DC | PRN
  Filled 2020-02-07: qty 177

## 2020-02-07 MED ORDER — LIDOCAINE VISCOUS HCL 2 % MT SOLN: 15.0000 mL | Freq: Once | OROMUCOSAL | Status: DC

## 2020-02-07 MED ORDER — ONDANSETRON 4 MG PO TBDP
4.0000 mg | ORAL_TABLET | Freq: Four times a day (QID) | ORAL | Status: AC | PRN
  Filled 2020-02-07: qty 1

## 2020-02-07 MED ORDER — LIDOCAINE VISCOUS HCL 2 % MT SOLN
10.0000 mL | OROMUCOSAL | Status: DC | PRN
  Filled 2020-02-07: qty 15

## 2020-02-07 MED ORDER — HYDROCODONE-ACETAMINOPHEN 5-325 MG PO TABS
1.0000 | ORAL_TABLET | Freq: Four times a day (QID) | ORAL | Status: DC | PRN
  Administered 2020-02-09 – 2020-02-11 (×2): 1 via ORAL
  Filled 2020-02-07 (×2): qty 1

## 2020-02-07 MED ORDER — OXYCODONE HCL 5 MG PO TABS
5.0000 mg | ORAL_TABLET | Freq: Four times a day (QID) | ORAL | Status: DC | PRN
  Administered 2020-02-09 – 2020-02-11 (×3): 5 mg via ORAL
  Filled 2020-02-07 (×3): qty 1

## 2020-02-07 MED ORDER — SODIUM CHLORIDE 0.9 % IV SOLN: INTRAVENOUS | Status: DC

## 2020-02-07 MED ORDER — ENOXAPARIN SODIUM 40 MG/0.4ML ~~LOC~~ SOLN
40.0000 mg | SUBCUTANEOUS | Status: DC
  Administered 2020-02-08 – 2020-02-12 (×5): 40 mg via SUBCUTANEOUS
  Filled 2020-02-07 (×6): qty 0.4

## 2020-02-07 MED ORDER — SODIUM CHLORIDE 0.9 % IV SOLN
8.0000 mg/h | INTRAVENOUS | Status: AC
  Administered 2020-02-08 – 2020-02-09 (×2): 8 mg/h via INTRAVENOUS
  Filled 2020-02-07 (×4): qty 80

## 2020-02-07 MED ORDER — ALUM & MAG HYDROXIDE-SIMETH 200-200-20 MG/5ML PO SUSP
30.0000 mL | Freq: Once | ORAL | Status: DC
  Filled 2020-02-07: qty 30

## 2020-02-07 MED ORDER — ALUM & MAG HYDROXIDE-SIMETH 200-200-20 MG/5ML PO SUSP
30.0000 mL | Freq: Once | ORAL | Status: AC
  Administered 2020-02-07: 30 mL via ORAL
  Filled 2020-02-07: qty 30

## 2020-02-07 MED ORDER — ONDANSETRON 4 MG PO TBDP
4.0000 mg | ORAL_TABLET | Freq: Once | ORAL | Status: AC
  Administered 2020-02-07: 4 mg via ORAL
  Filled 2020-02-07: qty 1

## 2020-02-07 MED ORDER — ALUM & MAG HYDROXIDE-SIMETH 200-200-20 MG/5ML PO SUSP
30.0000 mL | ORAL | Status: DC | PRN
  Administered 2020-02-13: 30 mL via ORAL
  Filled 2020-02-07: qty 30

## 2020-02-07 MED ORDER — ACETAMINOPHEN 325 MG PO TABS
650.0000 mg | ORAL_TABLET | ORAL | Status: DC | PRN
  Filled 2020-02-07: qty 2

## 2020-02-07 MED ORDER — PANTOPRAZOLE SODIUM 40 MG IV SOLR
40.0000 mg | Freq: Two times a day (BID) | INTRAVENOUS | Status: DC
  Administered 2020-02-11 – 2020-02-12 (×3): 40 mg via INTRAVENOUS
  Filled 2020-02-07 (×3): qty 40

## 2020-02-07 MED ORDER — LABETALOL HCL 5 MG/ML IV SOLN
20.0000 mg | INTRAVENOUS | Status: DC | PRN
  Administered 2020-02-10 (×2): 20 mg via INTRAVENOUS
  Filled 2020-02-07 (×2): qty 4

## 2020-02-07 MED ORDER — DICYCLOMINE HCL 10 MG/5ML PO SOLN
10.0000 mg | Freq: Once | ORAL | Status: DC
  Filled 2020-02-07: qty 5

## 2020-02-07 MED ORDER — SODIUM CHLORIDE 0.9 % IV SOLN
80.0000 mg | Freq: Once | INTRAVENOUS | Status: AC
  Administered 2020-02-08: 80 mg via INTRAVENOUS
  Filled 2020-02-07: qty 80

## 2020-02-07 MED ORDER — ADULT MULTIVITAMIN W/MINERALS CH
1.0000 | ORAL_TABLET | Freq: Every day | ORAL | Status: DC
  Administered 2020-02-08 – 2020-02-13 (×5): 1 via ORAL
  Filled 2020-02-07 (×5): qty 1

## 2020-02-07 MED ORDER — HYDROXYZINE HCL 25 MG PO TABS
25.0000 mg | ORAL_TABLET | Freq: Four times a day (QID) | ORAL | Status: AC | PRN
  Administered 2020-02-08 – 2020-02-10 (×4): 25 mg via ORAL
  Filled 2020-02-07 (×5): qty 1

## 2020-02-07 MED ORDER — LORAZEPAM 1 MG PO TABS
1.0000 mg | ORAL_TABLET | Freq: Once | ORAL | Status: AC
  Administered 2020-02-07: 1 mg via ORAL
  Filled 2020-02-07: qty 1

## 2020-02-07 MED ORDER — THIAMINE HCL 100 MG/ML IJ SOLN
Freq: Once | INTRAVENOUS | Status: AC
  Filled 2020-02-07: qty 1000

## 2020-02-07 MED ORDER — LIDOCAINE VISCOUS HCL 2 % MT SOLN
15.0000 mL | Freq: Once | OROMUCOSAL | Status: AC
  Administered 2020-02-07: 15 mL via ORAL
  Filled 2020-02-07: qty 15

## 2020-02-07 MED ORDER — ASPIRIN EC 81 MG PO TBEC: 81.0000 mg | DELAYED_RELEASE_TABLET | Freq: Every day | ORAL | Status: DC

## 2020-02-07 MED ORDER — OXYCODONE-ACETAMINOPHEN 10-325 MG PO TABS: 1.0000 | ORAL_TABLET | Freq: Four times a day (QID) | ORAL | Status: DC | PRN

## 2020-02-07 NOTE — ED Notes (Addendum)
Admitting at the bedside for pt evaluation 

## 2020-02-07 NOTE — ED Notes (Signed)
Patient transported to X-ray 

## 2020-02-07 NOTE — ED Notes (Signed)
Attempted IV without success; Danelle Earthly RN to attempt with ultrasound

## 2020-02-07 NOTE — ED Notes (Signed)
Nurse noted blood on pt stretcher. Intact IV with tubing located on the ground next to stretcher and pt arm with dried blood where IV had been located. Pt states he "did not mean to" remove his IV.

## 2020-02-07 NOTE — ED Triage Notes (Signed)
Pt states his parents recently passed away and he has been drinking every day- pt states he has been drinking half a pint a day- pt states today he drank 2 pints of brandy

## 2020-02-07 NOTE — H&P (Addendum)
[place]   PATIENT NAME: Zachary Mendoza    MR#:  [id number]  DATE OF BIRTH:  Nov 16, 1990  DATE OF ADMISSION:  02/07/2020  PRIMARY CARE PHYSICIAN: [facility]   REQUESTING/REFERRING PHYSICIAN: Dorothea Glassman, MD  CHIEF COMPLAINT:   Chief Complaint  Patient presents with  . Alcohol Intoxication    would like detox    HISTORY OF PRESENT ILLNESS:  Zachary Mendoza  is a 29 y.o. Caucasian male with a known history of hypertension and panic attacks as well as alcohol and tobacco abuse and elevated LFTs, who presented to the emergency room with acute onset of burning chest pain with associated nausea and vomiting occasional coffee-ground emesis.  He also admitted to melena without bright red bleeding per rectum.  He denies any fever or chills.  His pain has been epigastric and substernal area.  He admits to drinking 1 pint of alcohol daily and he was interested in detoxification.  He denied any other bleeding diathesis.  No headache or dizziness or blurred vision.  He was fairly somnolent but arousable.Marland Kitchen  Upon presentation to the emergency room, heart rate was 128 with otherwise normal vital signs.  Labs revealed a blood glucose of 216 and a significantly elevated AST of 2070 up from 64 a month ago and ALT of 1383 up from 92 then.  CBC was unremarkable except for hemoconcentration.  COVID-19 PCR is currently pending.  Right upper quadrant ultrasound is currently pending.  Alcohol levels was 356.  The patient was given GI cocktail and p.o. Bentyl as well as IV normal saline at 200 mL/h. He will be admitted to a progressive unit bed for further evaluation and management.  PAST MEDICAL HISTORY:   Past Medical History:  Diagnosis Date  . Hypertension   . Panic attacks   Alcohol use, ADHD, tobacco abuse, generalized anxiety disorder, abnormal LFTs  PAST SURGICAL HISTORY:   Past Surgical History:  Procedure Laterality Date  . ESOPHAGOGASTRODUODENOSCOPY (EGD) WITH PROPOFOL  N/A 10/04/2019   Procedure: ESOPHAGOGASTRODUODENOSCOPY (EGD) WITH PROPOFOL;  Surgeon: Midge Minium, MD;  Location: [facility] ENDOSCOPY;  Service: Endoscopy;  Laterality: N/A;    SOCIAL HISTORY:   Social History   Tobacco Use  . Smoking status: Current Every Day Smoker    Packs/day: 1.00    Types: Cigarettes  . Smokeless tobacco: Never Used  Substance Use Topics  . Alcohol use: Not Currently    Comment: about week ago    FAMILY HISTORY:   Family History  Problem Relation Age of Onset  . Hypertension Mother     DRUG ALLERGIES:   Allergies  Allergen Reactions  . Penicillins Hives    Pt states he is not allergic, but that his sister was.    REVIEW OF SYSTEMS:   ROS As per history of present illness. All pertinent systems were reviewed above. Constitutional, HEENT, cardiovascular, respiratory, GI, GU, musculoskeletal, neuro, psychiatric, endocrine, integumentary and hematologic systems were reviewed and are otherwise negative/unremarkable except for positive findings mentioned above in the HPI.   MEDICATIONS AT HOME:   Prior to Admission medications   Medication Sig Start Date End Date Taking? Authorizing Provider  chlordiazePOXIDE (LIBRIUM) 10 MG capsule Day 1-2: Take 1 tablet p.o. every 8 hours. Day 3-4: Take 1 tablet p.o. every 12 hours. Day 5-6: Take 1 tablet p.o. daily. Day 7: Stop 01/05/20   Minna Antis, MD  clindamycin (CLEOCIN) 150 MG capsule 2 caps tid until finished 01/17/20   Bridget Hartshorn  L, PA-C  doxycycline (VIBRA-TABS) 100 MG tablet Take 1 tablet (100 mg total) by mouth 2 (two) times daily. 01/23/20   Cuthriell, Delorise Royals, PA-C  HYDROcodone-acetaminophen (NORCO/VICODIN) 5-325 MG tablet Take 1 tablet by mouth every 6 (six) hours as needed for moderate pain. 01/17/20   Tommi Rumps, PA-C  lidocaine (XYLOCAINE) 2 % solution Use as directed 10 mLs in the mouth or throat every 4 (four) hours as needed for mouth pain. Swish and spit 01/23/20   Cuthriell,  Delorise Royals, PA-C  oxyCODONE-acetaminophen (PERCOCET) 10-325 MG tablet Take 1 tablet by mouth every 6 (six) hours as needed for pain. 01/23/20 01/22/21  Cuthriell, Delorise Royals, PA-C  phenol (CHLORASEPTIC) 1.4 % LIQD Use as directed 1 spray in the mouth or throat as needed for throat irritation / pain. 10/06/19   Pennie Banter, DO      VITAL SIGNS:  Blood pressure (!) 124/92, pulse (!) 128, temperature 98.3 F (36.8 C), temperature source Oral, resp. rate 18, height 5\' 7"  (1.702 m), weight 106.6 kg, SpO2 98 %.  PHYSICAL EXAMINATION:  Physical Exam  GENERAL:  29 y.o.-year-old Caucasian male patient lying in the bed with no acute distress.  He was somnolent but arousable. EYES: Pupils equal, round, reactive to light and accommodation. No scleral icterus. Extraocular muscles intact.  HEENT: Head atraumatic, normocephalic. Oropharynx and nasopharynx clear.  NECK:  Supple, no jugular venous distention. No thyroid enlargement, no tenderness.  LUNGS: Normal breath sounds bilaterally, no wheezing, rales,rhonchi or crepitation. No use of accessory muscles of respiration.  CARDIOVASCULAR: Regular rate and rhythm, S1, S2 normal. No murmurs, rubs, or gallops.  ABDOMEN: Soft, nondistended with mild epigastric and right upper quadrant tenderness without rebound tenderness, guarding or rigidity.  Bowel sounds present. No organomegaly or mass.  EXTREMITIES: No pedal edema, cyanosis, or clubbing.  NEUROLOGIC: Cranial nerves II through XII are intact. Muscle strength 5/5 in all extremities. Sensation intact. Gait not checked.  PSYCHIATRIC: The patient is alert and oriented x 3.  Normal affect and good eye contact. SKIN: No obvious rash, lesion, or ulcer.   LABORATORY PANEL:   CBC Recent Labs  Lab 02/07/20 1831  WBC 7.5  HGB 17.2*  HCT 49.8  PLT 325   ------------------------------------------------------------------------------------------------------------------  Chemistries  Recent Labs  Lab  02/07/20 1831  NA 140  K 3.7  CL 101  CO2 24  GLUCOSE 216*  BUN 24*  CREATININE 0.97  CALCIUM 8.6*  AST 2,070*  ALT 1,383*  ALKPHOS 80  BILITOT 1.0   ------------------------------------------------------------------------------------------------------------------  Cardiac Enzymes No results for input(s): TROPONINI in the last 168 hours. ------------------------------------------------------------------------------------------------------------------  RADIOLOGY:  DG Chest Portable 1 View  Result Date: 02/07/2020 CLINICAL DATA:  Chest pain EXAM: PORTABLE CHEST 1 VIEW COMPARISON:  12/23/2019 FINDINGS: The heart size and mediastinal contours are within normal limits. Both lungs are clear. The visualized skeletal structures are unremarkable. IMPRESSION: No active disease. Electronically Signed   By: 02/23/2020 M.D.   On: 02/07/2020 19:15      IMPRESSION AND PLAN:   1.  Acute hepatitis, likely alcoholic. -The patient will be admitted to a progressive unit bed. -He will be hydrated with IV normal saline and will follow his LFTs. -We will place him on clear liquids for now. -We will obtain right upper quadrant ultrasound and acute viral hepatitis panel. -We will check his coagulation profile. -GI consultation will be obtained. -I notified Dr. 02/09/2020 about the patient.  2.  Chest pain, likely atypical, probably related  to his acute hepatitis. -We will follow serial troponin I's. -He will be continued on as needed Maalox and will also place him on as needed sublingual nitroglycerin and morphine sulfate.  3.  Alcohol abuse with intoxication with possibly alcoholic gastritis and possible GI bleeding.. -The patient is interested in detoxification. -We will place him on Ativan protocol. -We will place him on IV Protonix with bolus and drip. -GI consultation will be obtained as mentioned above. -Serial hemoglobins and hematocrits will be followed. -We will check stool  Hemoccult.  4.  Ongoing tobacco abuse. -Counseled him for smoking cessation and he will receive further counseling here.  5.  Hypertension. -He will be placed on as needed IV labetalol.  6.  Anxiety disorder with panic attacks. -He will be on as needed Ativan as mentioned above.  7.  DVT prophylaxis. -Subcutaneous Lovenox.  All the records are reviewed and case discussed with ED provider. The plan of care was discussed in details with the patient (and family). I answered all questions. The patient agreed to proceed with the above mentioned plan. Further management will depend upon hospital course.   CODE STATUS: Full code  Status is: Inpatient  Remains inpatient appropriate because:Ongoing active pain requiring inpatient pain management, Ongoing diagnostic testing needed not appropriate for outpatient work up, Unsafe d/c plan, IV treatments appropriate due to intensity of illness or inability to take PO and Inpatient level of care appropriate due to severity of illness   Dispo: The patient is from: Home              Anticipated d/c is to: Home              Anticipated d/c date is: 3 days              Patient currently is not medically stable to d/c.   TOTAL TIME TAKING CARE OF THIS PATIENT: 55 minutes.    Hannah Beat M.D on 02/07/2020 at 8:33 PM  [facility]   From 7 PM-7 AM, contact night-coverage [email or web address]  CC: Primary care physician; Dan Humphreys, [facility]   Note: This dictation was prepared with Dragon dictation along with smaller phrase technology. Any transcriptional typo errors that result from this process are unintentional.

## 2020-02-07 NOTE — ED Notes (Signed)
Lab called for add on 

## 2020-02-07 NOTE — ED Notes (Addendum)
elevated liver enzymes  Hold all acetaminophen at this time  Pt ambu to toilet att, reports needing to stool

## 2020-02-07 NOTE — ED Notes (Signed)
First cal report - no room assigned floor unaware of pt coming, Aurea Graff, sec to call me back after talking to charge

## 2020-02-07 NOTE — ED Notes (Signed)
Pt up to restroom with steady gait.

## 2020-02-07 NOTE — ED Notes (Signed)
Pt actively vomiting.

## 2020-02-07 NOTE — ED Provider Notes (Signed)
[facility] Emergency Department Provider Note   ____________________________________________   First MD Initiated Contact with Patient 02/07/20 1846     (approximate)  I have reviewed the triage vital signs and the nursing notes.   HISTORY  Chief Complaint Alcohol Intoxication (would like detox)    HPI Zachary Mendoza. is a 29 y.o. male patient tells [place] that his parents recently died and he has been drinking every day.  He has been drinking half a pint a day and drank 2 parts of brandy today.  Patient complains of burning pain in his center of his chest.  He says it usually gets better with a GI cocktail.  He is asking for one now.         Past Medical History:  Diagnosis Date  . Hypertension   . Panic attacks     Patient Active Problem List   Diagnosis Date Noted  . Leukocytosis 10/04/2019  . Abnormal LFTs 10/04/2019  . Hematemesis 10/04/2019  . Nausea vomiting and diarrhea 10/04/2019  . Hypertension   . Tobacco abuse   . Abdominal pain   . Chest pain   . Esophagitis   . Alcohol abuse 03/29/2019  . Severe anxiety with panic 03/28/2019  . Alcoholic intoxication with complication [facility])     Past Surgical History:  Procedure Laterality Date  . ESOPHAGOGASTRODUODENOSCOPY (EGD) WITH PROPOFOL N/A 10/04/2019   Procedure: ESOPHAGOGASTRODUODENOSCOPY (EGD) WITH PROPOFOL;  Surgeon: Midge Minium, MD;  Location: [facility] ENDOSCOPY;  Service: Endoscopy;  Laterality: N/A;    Prior to Admission medications   Medication Sig Start Date End Date Taking? Authorizing Provider  chlordiazePOXIDE (LIBRIUM) 10 MG capsule Day 1-2: Take 1 tablet p.o. every 8 hours. Day 3-4: Take 1 tablet p.o. every 12 hours. Day 5-6: Take 1 tablet p.o. daily. Day 7: Stop 01/05/20   Minna Antis, MD  clindamycin (CLEOCIN) 150 MG capsule 2 caps tid until finished 01/17/20   Tommi Rumps, PA-C  doxycycline (VIBRA-TABS) 100 MG tablet Take 1 tablet (100 mg total) by mouth 2  (two) times daily. 01/23/20   Cuthriell, Delorise Royals, PA-C  HYDROcodone-acetaminophen (NORCO/VICODIN) 5-325 MG tablet Take 1 tablet by mouth every 6 (six) hours as needed for moderate pain. 01/17/20   Tommi Rumps, PA-C  lidocaine (XYLOCAINE) 2 % solution Use as directed 10 mLs in the mouth or throat every 4 (four) hours as needed for mouth pain. Swish and spit 01/23/20   Cuthriell, Delorise Royals, PA-C  oxyCODONE-acetaminophen (PERCOCET) 10-325 MG tablet Take 1 tablet by mouth every 6 (six) hours as needed for pain. 01/23/20 01/22/21  Cuthriell, Delorise Royals, PA-C  phenol (CHLORASEPTIC) 1.4 % LIQD Use as directed 1 spray in the mouth or throat as needed for throat irritation / pain. 10/06/19   Pennie Banter, DO    Allergies Penicillins  Family History  Problem Relation Age of Onset  . Hypertension Mother     Social History Social History   Tobacco Use  . Smoking status: Current Every Day Smoker    Packs/day: 1.00    Types: Cigarettes  . Smokeless tobacco: Never Used  Vaping Use  . Vaping Use: Never used  Substance Use Topics  . Alcohol use: Not Currently    Comment: about week ago  . Drug use: Not Currently    Types: Methamphetamines    Review of Systems  Constitutional: No fever/chills Eyes: No visual changes. ENT: No sore throat. Cardiovascular:  chest pain see HPI. Respiratory: Denies  shortness of breath. Gastrointestinal: No abdominal pain.  No nausea, no vomiting.  No diarrhea.  No constipation. Genitourinary: Negative for dysuria. Musculoskeletal: Negative for back pain. Skin: Negative for rash. Neurological: Negative for headaches, focal weakness   ____________________________________________   PHYSICAL EXAM:  VITAL SIGNS: ED Triage Vitals  Enc Vitals Group     BP 02/07/20 1813 (!) 124/92     Pulse Rate 02/07/20 1813 (!) 128     Resp 02/07/20 1813 18     Temp 02/07/20 1813 98.3 F (36.8 C)     Temp Source 02/07/20 1813 Oral     SpO2 02/07/20 1813 98 %      Weight 02/07/20 1829 235 lb (106.6 kg)     Height 02/07/20 1829 5\' 7"  (1.702 m)     Head Circumference --      Peak Flow --      Pain Score 02/07/20 1828 8     Pain Loc --      Pain Edu? --      Excl. in GC? --     Constitutional: Alert and oriented.  Appears intoxicated though. Eyes: Conjunctivae are slightly injected bilaterally Head: Atraumatic. Nose: No congestion/rhinnorhea. Mouth/Throat: Mucous membranes are moist.  Oropharynx non-erythematous. Neck: No stridor. Cardiovascular: Normal rate, regular rhythm. Grossly normal heart sounds.  Good peripheral circulation. Respiratory: Normal respiratory effort.  No retractions. Lungs CTAB. Gastrointestinal: Soft and nontender. No distention. No abdominal bruits.  Musculoskeletal: No lower extremity tenderness nor edema.  Neurologic:  Normal speech and language. No gross focal neurologic deficits are appreciated.  Skin:  Skin is warm, flushed, dry and intact. No rash noted.  ____________________________________________   LABS (all labs ordered are listed, but only abnormal results are displayed)  Labs Reviewed  COMPREHENSIVE METABOLIC PANEL - Abnormal; Notable for the following components:      Result Value   Glucose, Bld 216 (*)    BUN 24 (*)    Calcium 8.6 (*)    AST 2,070 (*)    ALT 1,383 (*)    All other components within normal limits  ETHANOL - Abnormal; Notable for the following components:   Alcohol, Ethyl (B) 356 (*)    All other components within normal limits  CBC - Abnormal; Notable for the following components:   RBC 5.98 (*)    Hemoglobin 17.2 (*)    All other components within normal limits  SARS CORONAVIRUS 2 BY RT PCR (HOSPITAL ORDER, PERFORMED IN [facility])  URINE DRUG SCREEN, QUALITATIVE ([facility] ONLY)  PROTIME-INR  APTT  ACETAMINOPHEN LEVEL  SALICYLATE LEVEL   ____________________________________________  EKG   ____________________________________________  RADIOLOGY  ED MD  interpretation:    Official radiology report(s): DG Chest Portable 1 View  Result Date: 02/07/2020 CLINICAL DATA:  Chest pain EXAM: PORTABLE CHEST 1 VIEW COMPARISON:  12/23/2019 FINDINGS: The heart size and mediastinal contours are within normal limits. Both lungs are clear. The visualized skeletal structures are unremarkable. IMPRESSION: No active disease. Electronically Signed   By: 02/23/2020 M.D.   On: 02/07/2020 19:15    ____________________________________________   PROCEDURES  Procedure(s) performed (including Critical Care):  Procedures   ____________________________________________   INITIAL IMPRESSION / ASSESSMENT AND PLAN / ED COURSE  EKG is currently pending.  Patient has severe hepatitis probably alcoholic based on his history.  We will have to get him in.  We will do a ultrasound to make sure there is no other etiology for his hepatitis and also a hepatitis  panel.  I have a Tylenol level cooking as well.  We can detox him here in the hospital I think that would be the safest thing.             ____________________________________________   FINAL CLINICAL IMPRESSION(S) / ED DIAGNOSES  Final diagnoses:  Alcoholic intoxication with complication (HCC)  Hepatitis     ED Discharge Orders    None       Note:  This document was prepared using Dragon voice recognition software and may include unintentional dictation errors.    Arnaldo Natal, MD 02/07/20 2015

## 2020-02-07 NOTE — ED Notes (Signed)
Second call report att

## 2020-02-07 NOTE — ED Notes (Signed)
IV placed using ultrasound due to being a difficult stick

## 2020-02-07 NOTE — BH Assessment (Signed)
Assessment Note  Zachary L Latouche Montez Hageman. is an 29 y.o. male presenting to [facility] ED voluntarily requesting detox treatment. Per triage note Pt states his parents recently passed away and he has been drinking every day- pt states he has been drinking half a pint a day- pt states today he drank 2 pints of brandy. During assessment patient is alert and oriented x4, calm and cooperative but irritable. Patient reports drinking "2 pints of liquor" daily and reports he has been drinking daily for 2 years. Patient reported current withdrawal symptoms "hot flashes, nausea, shaky hands, upset stomach." Patient denies SI/HI/AH/VH and does not appear to be responding to any internal or external stimuli.  Patient is not currently medically cleared and will be admitted medically for his alcohol withdrawal symptoms.  Diagnosis: Alcohol Use Disorder, Severe   Past Medical History:  Past Medical History:  Diagnosis Date  . Hypertension   . Panic attacks     Past Surgical History:  Procedure Laterality Date  . ESOPHAGOGASTRODUODENOSCOPY (EGD) WITH PROPOFOL N/A 10/04/2019   Procedure: ESOPHAGOGASTRODUODENOSCOPY (EGD) WITH PROPOFOL;  Surgeon: Midge Minium, MD;  Location: [facility] ENDOSCOPY;  Service: Endoscopy;  Laterality: N/A;    Family History:  Family History  Problem Relation Age of Onset  . Hypertension Mother     Social History:  reports that he has been smoking cigarettes. He has been smoking about 1.00 pack per day. He has never used smokeless tobacco. He reports previous alcohol use. He reports previous drug use. Drug: Methamphetamines.  Additional Social History:  Alcohol / Drug Use Pain Medications: See MAR Prescriptions: See MAR Over the Counter: See MAR History of alcohol / drug use?: Yes Substance #1 Name of Substance 1: Alcohol  CIWA: CIWA-Ar BP: (!) 124/92 Pulse Rate: (!) 128 Nausea and Vomiting: mild nausea with no vomiting Tactile Disturbances: none Tremor: no tremor Auditory  Disturbances: not present Paroxysmal Sweats: no sweat visible Visual Disturbances: not present Anxiety: mildly anxious Headache, Fullness in Head: moderate Agitation: normal activity Orientation and Clouding of Sensorium: oriented and can do serial additions CIWA-Ar Total: 5 COWS:    Allergies:  Allergies  Allergen Reactions  . Penicillins Hives    Pt states he is not allergic, but that his sister was.    Home Medications: (Not in a hospital admission)   OB/GYN Status:  No LMP for male patient.  General Assessment Data Location of Assessment: [facility] ED TTS Assessment: In system Is this a Tele or Face-to-Face Assessment?: Face-to-Face Is this an Initial Assessment or a Re-assessment for this encounter?: Initial Assessment Patient Accompanied by:: N/A Language Other than English: No Living Arrangements: Other (Comment) What gender do you identify as?: Male Marital status: Married Pregnancy Status: No Living Arrangements: Spouse/significant other, Children Can pt return to current living arrangement?: Yes Admission Status: Voluntary Is patient capable of signing voluntary admission?: Yes Referral Source: Self/Family/Friend Insurance type: None  Medical Screening Exam [facility] Walk-in ONLY) Medical Exam completed: Yes  Crisis Care Plan Living Arrangements: Spouse/significant other, Children Legal Guardian: Other: (Self) Name of Psychiatrist: None reported  Name of Therapist: None reported   Education Status Is patient currently in school?: No Is the patient employed, unemployed or receiving disability?: Unemployed  Risk to self with the past 6 months Suicidal Ideation: No Has patient been a risk to self within the past 6 months prior to admission? : No Suicidal Intent: No Has patient had any suicidal intent within the past 6 months prior to admission? : No Is patient at  risk for suicide?: No Suicidal Plan?: No Has patient had any suicidal plan within the past 6  months prior to admission? : No Access to Means: No What has been your use of drugs/alcohol within the last 12 months?: Alcohol Previous Attempts/Gestures: No How many times?: 0 Other Self Harm Risks: None Triggers for Past Attempts: None known Intentional Self Injurious Behavior: None Family Suicide History: Unknown Recent stressful life event(s): Other (Comment) (Recent loss of parents) Persecutory voices/beliefs?: No Depression: Yes Depression Symptoms: Isolating, Loss of interest in usual pleasures, Feeling worthless/self pity Substance abuse history and/or treatment for substance abuse?: Yes Suicide prevention information given to non-admitted patients: Not applicable  Risk to Others within the past 6 months Homicidal Ideation: No Does patient have any lifetime risk of violence toward others beyond the six months prior to admission? : No Thoughts of Harm to Others: No Current Homicidal Intent: No Current Homicidal Plan: No Access to Homicidal Means: No Identified Victim: None History of harm to others?: No Assessment of Violence: None Noted Violent Behavior Description: None Does patient have access to weapons?: No Criminal Charges Pending?: No Does patient have a court date: No Is patient on probation?: No  Psychosis Hallucinations: None noted Delusions: None noted  Mental Status Report Appearance/Hygiene: Unremarkable Eye Contact: Poor Motor Activity: Freedom of movement Speech: Logical/coherent, Soft Level of Consciousness: Alert, Irritable Mood: Irritable Affect: Appropriate to circumstance Anxiety Level: None Thought Processes: Coherent Judgement: Partial Orientation: Person, Place, Time, Situation, Appropriate for developmental age Obsessive Compulsive Thoughts/Behaviors: None  Cognitive Functioning Concentration: Normal Memory: Recent Intact, Remote Intact Is patient IDD: No Insight: Fair Impulse Control: Good Appetite: Poor Have you had any weight  changes? : No Change Sleep: Decreased Total Hours of Sleep: 5 Vegetative Symptoms: None  ADLScreening [facility] Assessment Services) Patient's cognitive ability adequate to safely complete daily activities?: Yes Patient able to express need for assistance with ADLs?: Yes Independently performs ADLs?: Yes (appropriate for developmental age)  Prior Inpatient Therapy Prior Inpatient Therapy: Yes Prior Therapy Dates: 06/2019 Prior Therapy Facilty/Provider(s): [facility] Reason for Treatment: Alcohol Detox & Treatment  Prior Outpatient Therapy Prior Outpatient Therapy: No Does patient have an ACCT team?: No Does patient have Intensive In-House Services?  : No Does patient have [facility] services? : No Does patient have P4CC services?: No  ADL Screening (condition at time of admission) Patient's cognitive ability adequate to safely complete daily activities?: Yes Is the patient deaf or have difficulty hearing?: No Does the patient have difficulty seeing, even when wearing glasses/contacts?: No Does the patient have difficulty concentrating, remembering, or making decisions?: No Patient able to express need for assistance with ADLs?: Yes Does the patient have difficulty dressing or bathing?: No Independently performs ADLs?: Yes (appropriate for developmental age) Does the patient have difficulty walking or climbing stairs?: No Weakness of Legs: None Weakness of Arms/Hands: None  Home Assistive Devices/Equipment Home Assistive Devices/Equipment: None  Therapy Consults (therapy consults require a physician order) PT Evaluation Needed: No OT Evalulation Needed: No SLP Evaluation Needed: No Abuse/Neglect Assessment (Assessment to be complete while patient is alone) Abuse/Neglect Assessment Can Be Completed: Yes Physical Abuse: Denies Verbal Abuse: Denies Sexual Abuse: Denies Exploitation of patient/patient's resources: Denies Self-Neglect: Denies Values / Beliefs Cultural Requests  During Hospitalization: None Spiritual Requests During Hospitalization: None Consults Spiritual Care Consult Needed: No Transition of Care Team Consult Needed: No Advance Directives (For Healthcare) Does Patient Have a Medical Advance Directive?: No          Disposition:  Patient is not currently medically cleared and will be admitted medically for his alcohol withdrawal symptoms. Disposition Initial Assessment Completed for this Encounter: Yes  On Site Evaluation by:   Reviewed with Physician:    Benay Pike MS LCASA 02/07/2020 9:56 PM

## 2020-02-07 NOTE — ED Notes (Signed)
TTS at the bedside. 

## 2020-02-07 NOTE — ED Notes (Signed)
Pt removed IV - bleeding abated without bandange

## 2020-02-08 DIAGNOSIS — K7010 Alcoholic hepatitis without ascites: Secondary | ICD-10-CM | POA: Diagnosis present

## 2020-02-08 DIAGNOSIS — F410 Panic disorder [episodic paroxysmal anxiety] without agoraphobia: Secondary | ICD-10-CM

## 2020-02-08 DIAGNOSIS — K209 Esophagitis, unspecified without bleeding: Secondary | ICD-10-CM

## 2020-02-08 DIAGNOSIS — K2970 Gastritis, unspecified, without bleeding: Secondary | ICD-10-CM | POA: Diagnosis present

## 2020-02-08 DIAGNOSIS — F10230 Alcohol dependence with withdrawal, uncomplicated: Secondary | ICD-10-CM | POA: Diagnosis present

## 2020-02-08 HISTORY — DX: Alcoholic hepatitis without ascites: K70.10

## 2020-02-08 LAB — HEMOGLOBIN AND HEMATOCRIT, BLOOD
HCT: 48.5 % (ref 39.0–52.0)
HCT: 49.5 % (ref 39.0–52.0)
HCT: 50.4 % (ref 39.0–52.0)
HCT: 44.9 % (ref 39.0–52.0)
Hemoglobin: 17.2 g/dL — ABNORMAL HIGH (ref 13.0–17.0)
Hemoglobin: 16.5 g/dL (ref 13.0–17.0)
Hemoglobin: 16.7 g/dL (ref 13.0–17.0)
Hemoglobin: 15.1 g/dL (ref 13.0–17.0)

## 2020-02-08 LAB — COMPREHENSIVE METABOLIC PANEL
ALT: 4438 U/L — ABNORMAL HIGH (ref 0–44)
AST: 8070 U/L — ABNORMAL HIGH (ref 15–41)
Albumin: 3.9 g/dL (ref 3.5–5.0)
Alkaline Phosphatase: 81 U/L (ref 38–126)
Anion gap: 15 (ref 5–15)
BUN: 26 mg/dL — ABNORMAL HIGH (ref 6–20)
CO2: 21 mmol/L — ABNORMAL LOW (ref 22–32)
Calcium: 7.6 mg/dL — ABNORMAL LOW (ref 8.9–10.3)
Chloride: 101 mmol/L (ref 98–111)
Creatinine, Ser: 0.9 mg/dL (ref 0.61–1.24)
GFR calc Af Amer: >60 mL/min (ref >60)
GFR calc non Af Amer: >60 mL/min (ref >60)
Glucose, Bld: 114 mg/dL — ABNORMAL HIGH (ref 70–99)
Potassium: 4.5 mmol/L (ref 3.5–5.1)
Sodium: 137 mmol/L (ref 135–145)
Total Bilirubin: 1.7 mg/dL — ABNORMAL HIGH (ref 0.3–1.2)
Total Protein: 6.8 g/dL (ref 6.5–8.1)

## 2020-02-08 LAB — HEPATITIS PANEL, ACUTE
HCV Ab: NONREACTIVE
Hep A IgM: NONREACTIVE
Hep B C IgM: NONREACTIVE
Hepatitis B Surface Ag: NONREACTIVE

## 2020-02-08 LAB — PHOSPHORUS: Phosphorus: 4.5 mg/dL (ref 2.5–4.6)

## 2020-02-08 LAB — LIPASE, BLOOD: Lipase: 56 U/L — ABNORMAL HIGH (ref 11–51)

## 2020-02-08 LAB — TROPONIN I (HIGH SENSITIVITY): Troponin I (High Sensitivity): 10 ng/L (ref <18)

## 2020-02-08 LAB — MAGNESIUM: Magnesium: 2.5 mg/dL — ABNORMAL HIGH (ref 1.7–2.4)

## 2020-02-08 MED ORDER — LORAZEPAM 2 MG/ML IJ SOLN
1.0000 mg | INTRAMUSCULAR | Status: AC | PRN
  Administered 2020-02-08 (×2): 2 mg via INTRAVENOUS
  Administered 2020-02-08: 1 mg via INTRAVENOUS
  Administered 2020-02-08 (×2): 2 mg via INTRAVENOUS
  Administered 2020-02-09: 3 mg via INTRAVENOUS
  Administered 2020-02-09 – 2020-02-10 (×2): 2 mg via INTRAVENOUS
  Administered 2020-02-10: 4 mg via INTRAVENOUS
  Filled 2020-02-08 (×3): qty 1
  Filled 2020-02-08: qty 2
  Filled 2020-02-08 (×2): qty 1
  Filled 2020-02-08 (×2): qty 2
  Filled 2020-02-08: qty 1

## 2020-02-08 MED ORDER — CHLORDIAZEPOXIDE HCL 25 MG PO CAPS
25.0000 mg | ORAL_CAPSULE | Freq: Three times a day (TID) | ORAL | Status: DC
  Administered 2020-02-08 – 2020-02-13 (×12): 25 mg via ORAL
  Filled 2020-02-08 (×13): qty 1

## 2020-02-08 MED ORDER — LORAZEPAM 1 MG PO TABS
1.0000 mg | ORAL_TABLET | ORAL | Status: AC | PRN
  Administered 2020-02-08: 1 mg via ORAL
  Administered 2020-02-09: 2 mg via ORAL
  Administered 2020-02-09: 4 mg via ORAL
  Filled 2020-02-08: qty 2
  Filled 2020-02-08: qty 4

## 2020-02-08 NOTE — Consult Note (Signed)
Melodie Bouillon, MD [address], [address], [place], [place], [address] [address], [place], [place], [address] Phone: [phone]  Fax: [phone]  Consultation  Referring Provider:     Dr. Arville Care Primary Care Physician:  [facility] Reason for Consultation:     Elevated liver enzymes  Date of Admission:  02/07/2020 Date of Consultation:  02/08/2020         HPI:   Zachary Hanshaw Takagi Montez Hageman. is a 29 y.o. male with history of daily alcohol use presents for alcohol detox, with GI being consulted due to elevated liver enzymes.  Patient reports drinking a pint of vodka every day for years. The patient denies abdominal or flank pain, anorexia, nausea or vomiting, dysphagia, change in bowel habits or black or bloody stools or weight loss.  No known history of cirrhosis.   Past Medical History:  Diagnosis Date  . Hypertension   . Panic attacks     Past Surgical History:  Procedure Laterality Date  . ESOPHAGOGASTRODUODENOSCOPY (EGD) WITH PROPOFOL N/A 10/04/2019   Procedure: ESOPHAGOGASTRODUODENOSCOPY (EGD) WITH PROPOFOL;  Surgeon: Midge Minium, MD;  Location: [facility] ENDOSCOPY;  Service: Endoscopy;  Laterality: N/A;    Prior to Admission medications   Medication Sig Start Date End Date Taking? Authorizing Provider  chlordiazePOXIDE (LIBRIUM) 10 MG capsule Day 1-2: Take 1 tablet p.o. every 8 hours. Day 3-4: Take 1 tablet p.o. every 12 hours. Day 5-6: Take 1 tablet p.o. daily. Day 7: Stop Patient not taking: Reported on 02/07/2020 01/05/20   Minna Antis, MD  clindamycin (CLEOCIN) 150 MG capsule 2 caps tid until finished Patient not taking: Reported on 02/07/2020 01/17/20   Tommi Rumps, PA-C  clindamycin (CLEOCIN) 300 MG capsule Take 300 mg by mouth every 6 (six) hours. Patient not taking: Reported on 02/07/2020 01/24/20   [provider]  doxycycline (VIBRA-TABS) 100 MG tablet Take 1 tablet (100 mg total) by mouth 2 (two) times daily. Patient not  taking: Reported on 02/07/2020 01/23/20   Cuthriell, Delorise Royals, PA-C  HYDROcodone-acetaminophen (NORCO/VICODIN) 5-325 MG tablet Take 1 tablet by mouth every 6 (six) hours as needed for moderate pain. Patient not taking: Reported on 02/07/2020 01/17/20   Bridget Hartshorn L, PA-C  lidocaine (XYLOCAINE) 2 % solution Use as directed 10 mLs in the mouth or throat every 4 (four) hours as needed for mouth pain. Swish and spit Patient not taking: Reported on 02/07/2020 01/23/20   Cuthriell, Delorise Royals, PA-C  oxyCODONE-acetaminophen (PERCOCET) 10-325 MG tablet Take 1 tablet by mouth every 6 (six) hours as needed for pain. Patient not taking: Reported on 02/07/2020 01/23/20 01/22/21  Cuthriell, Delorise Royals, PA-C    Family History  Problem Relation Age of Onset  . Hypertension Mother      Social History   Tobacco Use  . Smoking status: Current Every Day Smoker    Packs/day: 1.00    Types: Cigarettes  . Smokeless tobacco: Never Used  Vaping Use  . Vaping Use: Never used  Substance Use Topics  . Alcohol use: Not Currently    Comment: about week ago  . Drug use: Not Currently    Types: Methamphetamines    Allergies as of 02/07/2020 - Review Complete 02/07/2020  Allergen Reaction Noted  . Penicillins Hives 02/27/2019    Review of Systems:    All systems reviewed and negative except where noted in HPI.   Physical Exam:  Vital signs in last 24 hours: Vitals:   02/07/20 1829  02/07/20 2246 02/08/20 0117 02/08/20 1048  BP:  127/87 (!) 135/101 132/83  Pulse:  (!) 105 (!) 108 (!) 109  Resp:  20 20   Temp:   98.7 F (37.1 C)   TempSrc:   Oral   SpO2:  97% 100% 100%  Weight: 106.6 kg     Height: 5\' 7"  (1.702 m)      Last BM Date: 02/07/20 General:   Pleasant, cooperative in NAD Head:  Normocephalic and atraumatic. Eyes:   No icterus.   Conjunctiva pink. PERRLA. Ears:  Normal auditory acuity. Neck:  Supple; no masses or thyroidomegaly Lungs: Respirations even and unlabored. Lungs clear to  auscultation bilaterally.   No wheezes, crackles, or rhonchi.  Abdomen:  Soft, nondistended, nontender. Normal bowel sounds. No appreciable masses or hepatomegaly.  No rebound or guarding.  Neurologic:  Alert and oriented x3;  grossly normal neurologically. Skin:  Intact without significant lesions or rashes. Cervical Nodes:  No significant cervical adenopathy. Psych:  Alert and cooperative. Normal affect.  LAB RESULTS: Recent Labs    02/07/20 1831 02/07/20 1831 02/08/20 0114 02/08/20 0712 02/08/20 1101  WBC 7.5  --   --   --   --   HGB 17.2*   < > 17.2* 16.5 16.7  HCT 49.8   < > 48.5 49.5 50.4  PLT 325  --   --   --   --    < > = values in this interval not displayed.   BMET Recent Labs    02/07/20 1831  NA 140  K 3.7  CL 101  CO2 24  GLUCOSE 216*  BUN 24*  CREATININE 0.97  CALCIUM 8.6*   LFT Recent Labs    02/07/20 1831  PROT 7.7  ALBUMIN 4.4  AST 2,070*  ALT 1,383*  ALKPHOS 80  BILITOT 1.0   PT/INR Recent Labs    02/07/20 2128  LABPROT 17.3*  INR 1.5*    STUDIES: DG Chest Portable 1 View  Result Date: 02/07/2020 CLINICAL DATA:  Chest pain EXAM: PORTABLE CHEST 1 VIEW COMPARISON:  12/23/2019 FINDINGS: The heart size and mediastinal contours are within normal limits. Both lungs are clear. The visualized skeletal structures are unremarkable. IMPRESSION: No active disease. Electronically Signed   By: 02/23/2020 M.D.   On: 02/07/2020 19:15   02/09/2020 Abdomen Limited RUQ  Result Date: 02/07/2020 CLINICAL DATA:  Elevated LFTs EXAM: ULTRASOUND ABDOMEN LIMITED RIGHT UPPER QUADRANT COMPARISON:  None. FINDINGS: Gallbladder: No gallstones or wall thickening visualized. No sonographic Murphy sign noted by sonographer. Common bile duct: Diameter: Normal caliber, 3 mm Liver: Increased echotexture compatible with fatty infiltration. No focal abnormality or biliary ductal dilatation. Portal vein is patent on color Doppler imaging with normal direction of blood flow towards  the liver. Other: None. IMPRESSION: Fatty infiltration of the liver. No acute findings. Electronically Signed   By: 02/09/2020 M.D.   On: 02/07/2020 21:01      Impression / Plan:   Zachary Basher Charter Ivan Anchors. is a 29 y.o. y/o male with history of alcohol abuse, presents for detox, with GI being consulted for elevated transaminases  Clinical findings consistent with alcoholic hepatitis  Acute hepatitis panel negative Right upper quadrant ultrasound shows fatty infiltration of the liver  No signs of cirrhosis at this time  Discriminant function less than 32 and therefore no indication for steroid treatment  Patient advised to abstain from alcohol completely  He reports daily Tylenol use but acetaminophen level is less than  10  Continue to monitor daily CMP Good oral nutrition is also important at this time  Solid food diet okay from GI standpoint  Avoid hepatotoxic drugs CIWA protocol Folate, thiamine  No evidence of active GI bleeding, normal hemoglobin, no indication for endoscopic procedures.  H&P reports suspicion of GI bleed.  However, patient specifically denies any melena, hematochezia, coffee-ground or hematemesis.  In addition hemoglobin is normal  Thank you for involving me in the care of this patient.      LOS: 1 day   Pasty Spillers, MD  02/08/2020, 12:52 PM

## 2020-02-08 NOTE — Hospital Course (Addendum)
Zachary Mendoza  is a 29 y.o. Caucasian male with a known history of hypertension and panic attacks as well as alcohol and tobacco abuse and elevated LFTs, who presented to the ED on 02/07/20 with burning chest pain with associated nausea and vomiting occasional coffee-ground emesis, melena without bright red bleeding per rectum.  Reported drinking about 1 pint liquor daily, 2 pints that day, and expressed interest in detox.  ED Course: HR 128 with otherwise normal vitals.  Initial labs notable for AST 2070, ALT 1382 (both up from 64 and 92 respectively a month ago), INR 1.5, other LFT's within normal.  EtOH level was 356.  CBC was unremarkable except for hemoconcentration.   Right upper quadrant ultrasound showed fatty liver.   Treated with GI cocktail, Bentyl and IV fluids in the ED. Admitted to hospitalist service with GI consulted for further evaluation and management.  8/29: Withdrawal escalated despite Ativan and Librium. Transferred to ICU for Precedex drip.

## 2020-02-08 NOTE — Progress Notes (Signed)
PROGRESS NOTE    Zachary L Perfecto Jr.   POE:[id number]  DOB: 1991/02/02  PCP: [facility]    DOA: 02/07/2020 LOS: 1   Brief Narrative   Zachary Mendoza  is a 28 y.o. Caucasian male with a known history of hypertension and panic attacks as well as alcohol and tobacco abuse and elevated LFTs, who presented to the ED on 02/07/20 with burning chest pain with associated nausea and vomiting occasional coffee-ground emesis, melena without bright red bleeding per rectum.  Reported drinking about 1 pint liquor daily, 2 pints that day, and expressed interest in detox.  ED Course: HR 128 with otherwise normal vitals.  Initial labs notable for AST 2070, ALT 1382 (both up from 64 and 92 respectively a month ago), INR 1.5, other LFT's within normal.  EtOH level was 356.  CBC was unremarkable except for hemoconcentration.   Right upper quadrant ultrasound showed fatty liver.   Treated with GI cocktail, Bentyl and IV fluids in the ED. Admitted to hospitalist service with GI consulted for further evaluation and management.     Assessment & Plan   Principal Problem:   Acute alcoholic hepatitis Active Problems:   Alcohol dependence with uncomplicated withdrawal (HCC)   Gastritis   Esophagitis   Severe anxiety with panic   Acute alcoholic hepatitis - present on admission.  Pt drinks a pint of vodka daily.  RUQ U/S showed fatty liver without evidence of cirrhosis at this point.   AST on admission 2070 >> 8070 today. ALT on admission 1383 >> 4438 today. --GI consulted --No indication for steroids at this time with discriminant function < 32 --Complete alcohol cessation --Avoid hepatoxins --CMP's daily to monitor, expect improvement  Alcohol dependence with uncomplicated withdrawal - drinks about a pint or more of vodka daily, says he binge drinks.   No history of DT's or withdrawal seizures.  Has had some hallucinations in the past during detox. --Start scheduled Librium 25 mg  TID --CIWA protocol with PRN Ativan --Folic acid and thiamine  Gastritis and Esophagitis - chronic, seen on prior EGD in April 2021.  Secondary to daily EtOH most likely.   Reported hematemesis and melena at home prior to admission. --IV Protonix BID --GI following --Okay to have diet as tolerated  Severe anxiety with panic - on Librium and PRN Ativan as above.  Was prescribed PRN Vistaril on discharge in April, hold off for now while getting benzo's for withdrawal.  Used to be on Lexapro but this is not on his med history this admission.    Hypertension -  Likely due to anxiety and EtOH withdrawal.  PRN labetalol.  Once past withdrawal, will evaluate if need for chronic therapy on discharge.    Tobacco abuse - counseled on importance of smoking cessation.  Obesity: Body mass index is 36.81 kg/m.  Complicates overall care and prognosis.  Counseled on importance of diet, exercise for weight loss and overall health.    DVT prophylaxis: enoxaparin (LOVENOX) injection 40 mg Start: 02/07/20 2200   Diet:  Diet Orders (From admission, onward)    Start     Ordered   02/08/20 1436  Diet Heart Room service appropriate? Yes; Fluid consistency: Thin  Diet effective now       Question Answer Comment  Room service appropriate? Yes   Fluid consistency: Thin      02/08/20 1436            Code Status: Full Code    Subjective  02/08/20    Patient seen at bedside this AM.  He reports feeling extremely anxious, somewhat tremulous.  No fever/chills.  No further nausea/vomiting and say epigastric/chest pain better today.  No other acute complaints.   Disposition Plan & Communication   Status is: Inpatient  Remains inpatient appropriate because:IV treatments appropriate due to intensity of illness or inability to take PO.  Ongoing management of alcohol withdrawal.   Dispo: The patient is from: Home              Anticipated d/c is to: Home              Anticipated d/c date is: 3 days               Patient currently is not medically stable to d/c.   Family Communication: none at bedside, will attempt to call    Consults, Procedures, Significant Events   Consultants:   GI  Procedures:   None  Antimicrobials:   None    Objective   Vitals:   02/07/20 2246 02/08/20 0117 02/08/20 1048 02/08/20 1305  BP: 127/87 (!) 135/101 132/83 (!) 125/97  Pulse: (!) 105 (!) 108 (!) 109 (!) 108  Resp: 20 20  19   Temp:  98.7 F (37.1 C)  98.3 F (36.8 C)  TempSrc:  Oral    SpO2: 97% 100% 100% 100%  Weight:      Height:        Intake/Output Summary (Last 24 hours) at 02/08/2020 1552 Last data filed at 02/08/2020 1500 Gross per 24 hour  Intake 2128.33 ml  Output --  Net 2128.33 ml   Filed Weights   02/07/20 1829  Weight: 106.6 kg    Physical Exam:  General exam: awake, alert, no acute distress Respiratory system: CTAB, no wheezes, rales or rhonchi, normal respiratory effort. Cardiovascular system: normal S1/S2, RRR, no JVD, murmurs, rubs, gallops, no pedal edema.   Gastrointestinal system: soft, NT, ND, no HSM felt, +bowel sounds. Central nervous system: A&O x4. no gross focal neurologic deficits, normal speech Extremities: moves all, no cyanosis, normal tone Skin: dry, intact, normal temperature, face is flushed Psychiatry: anxious mood, congruent affect, judgement and insight appear normal, no apparent hallucinations  Labs   Data Reviewed: I have personally reviewed following labs and imaging studies  CBC: Recent Labs  Lab 02/07/20 1831 02/08/20 0114 02/08/20 0712 02/08/20 1101  WBC 7.5  --   --   --   HGB 17.2* 17.2* 16.5 16.7  HCT 49.8 48.5 49.5 50.4  MCV 83.3  --   --   --   PLT 325  --   --   --    Basic Metabolic Panel: Recent Labs  Lab 02/07/20 1831 02/08/20 0712 02/08/20 1101  NA 140  --  137  K 3.7  --  4.5  CL 101  --  101  CO2 24  --  21*  GLUCOSE 216*  --  114*  BUN 24*  --  26*  CREATININE 0.97  --  0.90  CALCIUM 8.6*  --   7.6*  MG  --   --  2.5*  PHOS  --  4.5  --    GFR: Estimated Creatinine Clearance: 138.4 mL/min (by C-G formula based on SCr of 0.9 mg/dL). Liver Function Tests: Recent Labs  Lab 02/07/20 1831 02/08/20 1101  AST 2,070* 8,070*  ALT 1,383* 4,438*  ALKPHOS 80 81  BILITOT 1.0 1.7*  PROT 7.7 6.8  ALBUMIN 4.4 3.9  Recent Labs  Lab 02/08/20 1101  LIPASE 56*   No results for input(s): AMMONIA in the last 168 hours. Coagulation Profile: Recent Labs  Lab 02/07/20 2128  INR 1.5*   Cardiac Enzymes: No results for input(s): CKTOTAL, CKMB, CKMBINDEX, TROPONINI in the last 168 hours. BNP (last 3 results) No results for input(s): PROBNP in the last 8760 hours. HbA1C: No results for input(s): HGBA1C in the last 72 hours. CBG: No results for input(s): GLUCAP in the last 168 hours. Lipid Profile: No results for input(s): CHOL, HDL, LDLCALC, TRIG, CHOLHDL, LDLDIRECT in the last 72 hours. Thyroid Function Tests: No results for input(s): TSH, T4TOTAL, FREET4, T3FREE, THYROIDAB in the last 72 hours. Anemia Panel: No results for input(s): VITAMINB12, FOLATE, FERRITIN, TIBC, IRON, RETICCTPCT in the last 72 hours. Sepsis Labs: No results for input(s): PROCALCITON, LATICACIDVEN in the last 168 hours.  Recent Results (from the past 240 hour(s))  SARS Coronavirus 2 by RT PCR (hospital order, performed in [facility] hospital lab) Nasopharyngeal Nasopharyngeal Swab     Status: None   Collection Time: 02/07/20  7:44 PM   Specimen: Nasopharyngeal Swab  Result Value Ref Range Status   SARS Coronavirus 2 NEGATIVE NEGATIVE Final    Comment: (NOTE) SARS-CoV-2 target nucleic acids are NOT DETECTED.  The SARS-CoV-2 RNA is generally detectable in upper and lower respiratory specimens during the acute phase of infection. The lowest concentration of SARS-CoV-2 viral copies this assay can detect is 250 copies / mL. A negative result does not preclude SARS-CoV-2 infection and should not be used as  the sole basis for treatment or other patient management decisions.  A negative result may occur with improper specimen collection / handling, submission of specimen other than nasopharyngeal swab, presence of viral mutation(s) within the areas targeted by this assay, and inadequate number of viral copies (<250 copies / mL). A negative result must be combined with clinical observations, patient history, and epidemiological information.  Fact Sheet for Patients:   BoilerBrush.com.cy  Fact Sheet for Healthcare Providers: https://pope.com/  This test is not yet approved or  cleared by the [place] FDA and has been authorized for detection and/or diagnosis of SARS-CoV-2 by FDA under an Emergency Use Authorization (EUA).  This EUA will remain in effect (meaning this test can be used) for the duration of the COVID-19 declaration under Section 564(b)(1) of the Act, 21 U.S.C. section 360bbb-3(b)(1), unless the authorization is terminated or revoked sooner.  Performed at [facility], [address] [place], [place] [address]       Imaging Studies   DG Chest Portable 1 View  Result Date: 02/07/2020 CLINICAL DATA:  Chest pain EXAM: PORTABLE CHEST 1 VIEW COMPARISON:  12/23/2019 FINDINGS: The heart size and mediastinal contours are within normal limits. Both lungs are clear. The visualized skeletal structures are unremarkable. IMPRESSION: No active disease. Electronically Signed   By: Alcide Clever M.D.   On: 02/07/2020 19:15   US Abdomen Limited RUQ  Result Date: 02/07/2020 CLINICAL DATA:  Elevated LFTs EXAM: ULTRASOUND ABDOMEN LIMITED RIGHT UPPER QUADRANT COMPARISON:  None. FINDINGS: Gallbladder: No gallstones or wall thickening visualized. No sonographic Murphy sign noted by sonographer. Common bile duct: Diameter: Normal caliber, 3 mm Liver: Increased echotexture compatible with fatty infiltration. No focal abnormality or  biliary ductal dilatation. Portal vein is patent on color Doppler imaging with normal direction of blood flow towards the liver. Other: None. IMPRESSION: Fatty infiltration of the liver. No acute findings. Electronically Signed   By:  Charlett NoseKevin  Dover M.D.   On: 02/07/2020 21:01     Medications   Scheduled Meds:  alum & mag hydroxide-simeth  30 mL Oral Once   And   lidocaine  15 mL Oral Once   chlordiazePOXIDE  25 mg Oral TID   dicyclomine  10 mg Oral Once   enoxaparin (LOVENOX) injection  40 mg Subcutaneous Q24H   multivitamin with minerals  1 tablet Oral Daily   [START ON 02/11/2020] pantoprazole  40 mg Intravenous Q12H   thiamine  100 mg Intramuscular Once   thiamine  100 mg Oral Daily   Continuous Infusions:  sodium chloride 100 mL/hr at 02/08/20 0440   pantoprozole (PROTONIX) infusion 8 mg/hr (02/08/20 0520)       LOS: 1 day    Time spent: 30 minutes    Pennie BanterKelly A Grabiel Schmutz, DO [facility]  02/08/2020, 3:52 PM    If 7PM-7AM, please contact night-coverage. How to contact the [facility] Attending or Consulting provider 7A - 7P or covering provider during after hours 7P -7A, for this patient?    1. Check the care [facility] in [facility] and look for a) attending/consulting TR[facility] provider listed and b) the [facility] team listed 2. Log into [email or web address] and use [facility]'s universal password to access. If you do not have the password, please contact the hospital operator. 3. Locate the [facility] provider you are looking for under [facility] and page to a number that you can be directly reached. 4. If you still have difficulty reaching the provider, please page the [facility] (Director on Call) for the Hospitalists listed on amion for assistance.

## 2020-02-09 DIAGNOSIS — F10929 Alcohol use, unspecified with intoxication, unspecified: Secondary | ICD-10-CM

## 2020-02-09 DIAGNOSIS — B179 Acute viral hepatitis, unspecified: Secondary | ICD-10-CM

## 2020-02-09 LAB — COMPREHENSIVE METABOLIC PANEL
ALT: 2269 U/L — ABNORMAL HIGH (ref 0–44)
AST: 1823 U/L — ABNORMAL HIGH (ref 15–41)
Albumin: 3.2 g/dL — ABNORMAL LOW (ref 3.5–5.0)
Alkaline Phosphatase: 87 U/L (ref 38–126)
Anion gap: 7 (ref 5–15)
BUN: 20 mg/dL (ref 6–20)
CO2: 23 mmol/L (ref 22–32)
Calcium: 8.1 mg/dL — ABNORMAL LOW (ref 8.9–10.3)
Chloride: 104 mmol/L (ref 98–111)
Creatinine, Ser: 1.02 mg/dL (ref 0.61–1.24)
GFR calc Af Amer: >60 mL/min (ref >60)
GFR calc non Af Amer: >60 mL/min (ref >60)
Glucose, Bld: 142 mg/dL — ABNORMAL HIGH (ref 70–99)
Potassium: 3.8 mmol/L (ref 3.5–5.1)
Sodium: 134 mmol/L — ABNORMAL LOW (ref 135–145)
Total Bilirubin: 1.7 mg/dL — ABNORMAL HIGH (ref 0.3–1.2)
Total Protein: 5.7 g/dL — ABNORMAL LOW (ref 6.5–8.1)

## 2020-02-09 MED ORDER — NICOTINE 21 MG/24HR TD PT24
21.0000 mg | MEDICATED_PATCH | Freq: Every day | TRANSDERMAL | Status: DC
  Administered 2020-02-09 – 2020-02-13 (×4): 21 mg via TRANSDERMAL
  Filled 2020-02-09 (×4): qty 1

## 2020-02-09 NOTE — Progress Notes (Signed)
PROGRESS NOTE    Zachary L Dionisio Jr.   ZOX:[id number]  DOB: 1990-11-09  [facility]: [facility]    DOA: 02/07/2020 LOS: 2   Brief Narrative   Zachary Mendoza  is a 29 y.o. Caucasian male with a known history of hypertension and panic attacks as well as alcohol and tobacco abuse and elevated LFTs, who presented to the ED on 02/07/20 with burning chest pain with associated nausea and vomiting occasional coffee-ground emesis, melena without bright red bleeding per rectum.  Reported drinking about 1 pint liquor daily, 2 pints that day, and expressed interest in detox.  ED Course: HR 128 with otherwise normal vitals.  Initial labs notable for AST 2070, ALT 1382 (both up from 64 and 92 respectively a month ago), INR 1.5, other LFT's within normal.  EtOH level was 356.  CBC was unremarkable except for hemoconcentration.   Right upper quadrant ultrasound showed fatty liver.   Treated with GI cocktail, Bentyl and IV fluids in the ED. Admitted to hospitalist service with GI consulted for further evaluation and management.     Assessment & Plan   Principal Problem:   Acute alcoholic hepatitis Active Problems:   Alcohol dependence with uncomplicated withdrawal (HCC)   Gastritis   Esophagitis   Severe anxiety with panic   Acute alcoholic hepatitis - present on admission.  Pt drinks a pint of vodka daily.  RUQ U/S showed fatty liver without evidence of cirrhosis at this point.  Hepatitis A/B/C were negative. AST on admission 2070 >> 8070 >> 1823. ALT on admission 1383 >> 4438 >> 2269. --GI consulted --Follow up other viral studies (EBV, CMV, HSV) --No indication for steroids at this time with discriminant function < 32 --Complete alcohol cessation --Avoid hepatoxins --CMP's daily to monitor, expect improvement  Alcohol dependence with uncomplicated withdrawal - drinks about a pint or more of vodka daily, says he binge drinks.   No history of DT's or withdrawal seizures.  Has had some  hallucinations in the past during detox. --Continue Librium 25 mg TID --CIWA protocol with PRN Ativan --Folic acid and thiamine --Discussed naltrexone with patient and he is agreeable.  Plan to start once withdrawal improves.  Gastritis and Esophagitis - chronic, seen on prior EGD in April 2021.  Secondary to daily EtOH most likely.   Reported hematemesis and melena at home prior to admission. --IV Protonix BID --GI following --Okay to have diet as tolerated  Severe anxiety with panic - on Librium and PRN Ativan as above.  Was prescribed PRN Vistaril on discharge in April, hold off for now while getting benzo's for withdrawal.  Used to be on Lexapro but this is not on his med history this admission.    Hypertension -  Likely due to anxiety and EtOH withdrawal.  PRN labetalol.  Once past withdrawal, will evaluate if need for chronic therapy on discharge.    Tobacco abuse - counseled on importance of smoking cessation.  Obesity: Body mass index is 36.81 kg/m.  Complicates overall care and prognosis.  Counseled on importance of diet, exercise for weight loss and overall health.    DVT prophylaxis: enoxaparin (LOVENOX) injection 40 mg Start: 02/07/20 2200   Diet:  Diet Orders (From admission, onward)    Start     Ordered   02/08/20 1436  Diet Heart Room service appropriate? Yes; Fluid consistency: Thin  Diet effective now       Question Answer Comment  Room service appropriate? Yes   Fluid consistency:  Thin      02/08/20 1436            Code Status: Full Code    Subjective 02/09/20    Patient seen sleeping soundly when seen on rounds this morning.  Return this afternoon when patient awake.  He reports feeling a little better.  Remains quite anxious which is normal for him and worse during withdrawal.  We discussed naltrexone to support his sobriety after discharge and reduce cravings for alcohol.  He is agreeable.     Disposition Plan & Communication   Status is:  Inpatient  Remains inpatient appropriate because:IV treatments appropriate due to intensity of illness or inability to take PO.  Ongoing management of alcohol withdrawal.   Dispo: The patient is from: Home              Anticipated d/c is to: Home              Anticipated d/c date is: 2 days              Patient currently is not medically stable to d/c.   Family Communication: Spoke to his significant other, Belenda Cruise, by phone this afternoon.   Consults, Procedures, Significant Events   Consultants:   GI  Procedures:   None  Antimicrobials:   None    Objective   Vitals:   02/08/20 1305 02/08/20 1939 02/09/20 0328 02/09/20 0746  BP: (!) 125/97 (!) 152/99 (!) 133/91 (!) 111/99  Pulse: (!) 108 (!) 107 88 77  Resp: 19 20 20 17   Temp: 98.3 F (36.8 C) 98.6 F (37 C) 98.9 F (37.2 C) 98.3 F (36.8 C)  TempSrc:  Oral Oral Oral  SpO2: 100% 99% 100% 96%  Weight:      Height:        Intake/Output Summary (Last 24 hours) at 02/09/2020 0819 Last data filed at 02/09/2020 0704 Gross per 24 hour  Intake 2028.33 ml  Output 2 ml  Net 2026.33 ml   Filed Weights   02/07/20 1829  Weight: 106.6 kg    Physical Exam:  General exam: awake, alert, no acute distress Respiratory system: CTAB, normal respiratory effort. Cardiovascular system: normal S1/S2, RRR, no pedal edema.   Gastrointestinal system: soft, NT, ND Central nervous system: A&O x4. no gross focal neurologic deficits, normal speech Psychiatry: anxious mood, congruent affect, judgement and insight appear normal, no apparent hallucinations  Labs   Data Reviewed: I have personally reviewed following labs and imaging studies  CBC: Recent Labs  Lab 02/07/20 1831 02/08/20 0114 02/08/20 0712 02/08/20 1101 02/08/20 1713  WBC 7.5  --   --   --   --   HGB 17.2* 17.2* 16.5 16.7 15.1  HCT 49.8 48.5 49.5 50.4 44.9  MCV 83.3  --   --   --   --   PLT 325  --   --   --   --    Basic Metabolic Panel: Recent Labs    Lab 02/07/20 1831 02/08/20 0712 02/08/20 1101  NA 140  --  137  K 3.7  --  4.5  CL 101  --  101  CO2 24  --  21*  GLUCOSE 216*  --  114*  BUN 24*  --  26*  CREATININE 0.97  --  0.90  CALCIUM 8.6*  --  7.6*  MG  --   --  2.5*  PHOS  --  4.5  --    GFR: Estimated Creatinine Clearance:  138.4 mL/min (by C-G formula based on SCr of 0.9 mg/dL). Liver Function Tests: Recent Labs  Lab 02/07/20 1831 02/08/20 1101  AST 2,070* 8,070*  ALT 1,383* 4,438*  ALKPHOS 80 81  BILITOT 1.0 1.7*  PROT 7.7 6.8  ALBUMIN 4.4 3.9   Recent Labs  Lab 02/08/20 1101  LIPASE 56*   No results for input(s): AMMONIA in the last 168 hours. Coagulation Profile: Recent Labs  Lab 02/07/20 2128  INR 1.5*   Cardiac Enzymes: No results for input(s): CKTOTAL, CKMB, CKMBINDEX, TROPONINI in the last 168 hours. BNP (last 3 results) No results for input(s): PROBNP in the last 8760 hours. HbA1C: No results for input(s): HGBA1C in the last 72 hours. CBG: No results for input(s): GLUCAP in the last 168 hours. Lipid Profile: No results for input(s): CHOL, HDL, LDLCALC, TRIG, CHOLHDL, LDLDIRECT in the last 72 hours. Thyroid Function Tests: No results for input(s): TSH, T4TOTAL, FREET4, T3FREE, THYROIDAB in the last 72 hours. Anemia Panel: No results for input(s): VITAMINB12, FOLATE, FERRITIN, TIBC, IRON, RETICCTPCT in the last 72 hours. Sepsis Labs: No results for input(s): PROCALCITON, LATICACIDVEN in the last 168 hours.  Recent Results (from the past 240 hour(s))  SARS Coronavirus 2 by RT PCR (hospital order, [facility]) Nasopharyngeal Nasopharyngeal Swab     Status: None   Collection Time: 02/07/20  7:44 PM   Specimen: Nasopharyngeal Swab  Result Value Ref Range Status   SARS Coronavirus 2 NEGATIVE NEGATIVE Final    Comment: (NOTE) SARS-CoV-2 target nucleic acids are NOT DETECTED.  The SARS-CoV-2 RNA is generally detectable in upper and lower respiratory specimens  during the acute phase of infection. The lowest concentration of SARS-CoV-2 viral copies this assay can detect is 250 copies / mL. A negative result does not preclude SARS-CoV-2 infection and should not be used as the sole basis for treatment or other patient management decisions.  A negative result may occur with improper specimen collection / handling, submission of specimen other than nasopharyngeal swab, presence of viral mutation(s) within the areas targeted by this assay, and inadequate number of viral copies (<250 copies / mL). A negative result must be combined with clinical observations, patient history, and epidemiological information.  Fact Sheet for Patients:   BoilerBrush.com.cyhttps://www.fda.gov/media/136312/download  Fact Sheet for Healthcare Providers: https://pope.com/https://www.fda.gov/media/136313/download  This test is not yet approved or  cleared by the [place] FDA and has been authorized for detection and/or diagnosis of SARS-CoV-2 by FDA under an Emergency Use Authorization (EUA).  This EUA will remain in effect (meaning this test can be used) for the duration of the COVID-19 declaration under Section 564(b)(1) of the Act, 21 U.S.C. section 360bbb-3(b)(1), unless the authorization is terminated or revoked sooner.  Performed at [facility], [address] [place], [place] [address]       Imaging Studies   DG Chest Portable 1 View  Result Date: 02/07/2020 CLINICAL DATA:  Chest pain EXAM: PORTABLE CHEST 1 VIEW COMPARISON:  12/23/2019 FINDINGS: The heart size and mediastinal contours are within normal limits. Both lungs are clear. The visualized skeletal structures are unremarkable. IMPRESSION: No active disease. Electronically Signed   By: Alcide CleverMark  Lukens M.D.   On: 02/07/2020 19:15   US Abdomen Limited RUQ  Result Date: 02/07/2020 CLINICAL DATA:  Elevated LFTs EXAM: ULTRASOUND ABDOMEN LIMITED RIGHT UPPER QUADRANT COMPARISON:  None. FINDINGS: Gallbladder: No gallstones or  wall thickening visualized. No sonographic Murphy sign noted by sonographer. Common bile duct: Diameter: Normal caliber, 3  mm Liver: Increased echotexture compatible with fatty infiltration. No focal abnormality or biliary ductal dilatation. Portal vein is patent on color Doppler imaging with normal direction of blood flow towards the liver. Other: None. IMPRESSION: Fatty infiltration of the liver. No acute findings. Electronically Signed   By: Charlett Nose M.D.   On: 02/07/2020 21:01     Medications   Scheduled Meds: . alum & mag hydroxide-simeth  30 mL Oral Once   And  . lidocaine  15 mL Oral Once  . chlordiazePOXIDE  25 mg Oral TID  . dicyclomine  10 mg Oral Once  . enoxaparin (LOVENOX) injection  40 mg Subcutaneous Q24H  . multivitamin with minerals  1 tablet Oral Daily  . [START ON 02/11/2020] pantoprazole  40 mg Intravenous Q12H  . thiamine  100 mg Intramuscular Once  . thiamine  100 mg Oral Daily   Continuous Infusions: . sodium chloride 100 mL/hr at 02/09/20 0010  . pantoprozole (PROTONIX) infusion 8 mg/hr (02/09/20 0101)       LOS: 2 days    Time spent: 32 minutes with greater than 50% spent in direct patient contact and coordination of care.    Pennie Banter, DO [facility]  02/09/2020, 8:19 AM    If 7PM-7AM, please contact night-coverage. How to contact the [facility] Attending or Consulting provider 7A - 7P or covering provider during after hours 7P -7A, for this patient?    1. Check the care team in [facility] and look for a) attending/consulting TRH provider listed and b) the [facility] team listed 2. Log into [email or web address] and use [place]'s universal password to access. If you do not have the password, please contact the hospital operator. 3. Locate the [facility] provider you are looking for under [facility] and page to a number that you can be directly reached. 4. If you still have difficulty reaching the provider, please page the [facility] (Director on Call) for the  Hospitalists listed on amion for assistance.

## 2020-02-09 NOTE — Progress Notes (Signed)
Recieved call form pats SO inquiring about the care if the patient. Reached out to MD.She is going to call her

## 2020-02-09 NOTE — Progress Notes (Signed)
Arlyss Repress, MD [address]  [address]  [place], [place] [address]  Main: [phone]  Fax: [phone] Pager: [phone]   Subjective: No acute events overnight.  Patient reports feeling very hungry.  He denies any abdominal pain, nausea or vomiting, fever, chills   Objective: Vital signs in last 24 hours: Vitals:   02/08/20 1939 02/09/20 0328 02/09/20 0746 02/09/20 1123  BP: (!) 152/99 (!) 133/91 (!) 111/99 (!) 138/93  Pulse: (!) 107 88 77 85  Resp: 20 20 17 17   Temp: 98.6 F (37 C) 98.9 F (37.2 C) 98.3 F (36.8 C) 98.6 F (37 C)  TempSrc: Oral Oral Oral Oral  SpO2: 99% 100% 96% 99%  Weight:      Height:       Weight change:   Intake/Output Summary (Last 24 hours) at 02/09/2020 1538 Last data filed at 02/09/2020 1345 Gross per 24 hour  Intake 480 ml  Output 2 ml  Net 478 ml     Exam: Heart:: Regular rate and rhythm, S1S2 present or without murmur or extra heart sounds Lungs: normal and clear to auscultation Abdomen: soft, nontender, normal bowel sounds   Lab Results: CBC Latest Ref Rng & Units 02/08/2020 02/08/2020 02/08/2020  WBC 4.0 - 10.5 K/uL - - -  Hemoglobin 13.0 - 17.0 g/dL 02/10/2020 81.8 29.9  Hematocrit 39 - 52 % 44.9 50.4 49.5  Platelets 150 - 400 K/uL - - -   CMP Latest Ref Rng & Units 02/08/2020 02/07/2020 01/03/2020  Glucose 70 - 99 mg/dL 01/05/2020) 696(V) 893(Y)  BUN 6 - 20 mg/dL 101(B) 51(W) 14  Creatinine 0.61 - 1.24 mg/dL 25(E 5.27 7.82  Sodium 135 - 145 mmol/L 137 140 141  Potassium 3.5 - 5.1 mmol/L 4.5 3.7 3.9  Chloride 98 - 111 mmol/L 101 101 103  CO2 22 - 32 mmol/L 21(L) 24 25  Calcium 8.9 - 10.3 mg/dL 7.6(L) 8.6(L) 9.2  Total Protein 6.5 - 8.1 g/dL 6.8 7.7 7.9  Total Bilirubin 0.3 - 1.2 mg/dL 4.23) 1.0 0.7  Alkaline Phos 38 - 126 U/L 81 80 67  AST 15 - 41 U/L 8,070(H) 2,070(H) 64(H)  ALT 0 - 44 U/L 4,438(H) 1,383(H) 92(H)    Micro Results: Recent Results (from the past 240 hour(s))  SARS Coronavirus 2 by RT PCR  (hospital order, performed in [facility] hospital lab) Nasopharyngeal Nasopharyngeal Swab     Status: None   Collection Time: 02/07/20  7:44 PM   Specimen: Nasopharyngeal Swab  Result Value Ref Range Status   SARS Coronavirus 2 NEGATIVE NEGATIVE Final    Comment: (NOTE) SARS-CoV-2 target nucleic acids are NOT DETECTED.  The SARS-CoV-2 RNA is generally detectable in upper and lower respiratory specimens during the acute phase of infection. The lowest concentration of SARS-CoV-2 viral copies this assay can detect is 250 copies / mL. A negative result does not preclude SARS-CoV-2 infection and should not be used as the sole basis for treatment or other patient management decisions.  A negative result may occur with improper specimen collection / handling, submission of specimen other than nasopharyngeal swab, presence of viral mutation(s) within the areas targeted by this assay, and inadequate number of viral copies (<250 copies / mL). A negative result must be combined with clinical observations, patient history, and epidemiological information.  Fact Sheet for Patients:   02/09/20  Fact Sheet for Healthcare Providers: BoilerBrush.com.cy  This test is not yet approved or  cleared by the https://pope.com/ FDA  and has been authorized for detection and/or diagnosis of SARS-CoV-2 by FDA under an Emergency Use Authorization (EUA).  This EUA will remain in effect (meaning this test can be used) for the duration of the COVID-19 declaration under Section 564(b)(1) of the Act, 21 U.S.C. section 360bbb-3(b)(1), unless the authorization is terminated or revoked sooner.  Performed at [facility], [address] [place], [place] [address]    Studies/Results: DG Chest Portable 1 View  Result Date: 02/07/2020 CLINICAL DATA:  Chest pain EXAM: PORTABLE CHEST 1 VIEW COMPARISON:  12/23/2019 FINDINGS: The heart size and mediastinal  contours are within normal limits. Both lungs are clear. The visualized skeletal structures are unremarkable. IMPRESSION: No active disease. Electronically Signed   By: Alcide Clever M.D.   On: 02/07/2020 19:15   US Abdomen Limited RUQ  Result Date: 02/07/2020 CLINICAL DATA:  Elevated LFTs EXAM: ULTRASOUND ABDOMEN LIMITED RIGHT UPPER QUADRANT COMPARISON:  None. FINDINGS: Gallbladder: No gallstones or wall thickening visualized. No sonographic Murphy sign noted by sonographer. Common bile duct: Diameter: Normal caliber, 3 mm Liver: Increased echotexture compatible with fatty infiltration. No focal abnormality or biliary ductal dilatation. Portal vein is patent on color Doppler imaging with normal direction of blood flow towards the liver. Other: None. IMPRESSION: Fatty infiltration of the liver. No acute findings. Electronically Signed   By: Charlett Nose M.D.   On: 02/07/2020 21:01   Medications:  I have reviewed the patient's current medications. Prior to Admission:  Medications Prior to Admission  Medication Sig Dispense Refill Last Dose  . chlordiazePOXIDE (LIBRIUM) 10 MG capsule Day 1-2: Take 1 tablet p.o. every 8 hours. Day 3-4: Take 1 tablet p.o. every 12 hours. Day 5-6: Take 1 tablet p.o. daily. Day 7: Stop (Patient not taking: Reported on 02/07/2020) 30 capsule 0 Completed Course at Unknown time  . clindamycin (CLEOCIN) 150 MG capsule 2 caps tid until finished (Patient not taking: Reported on 02/07/2020) 42 capsule 0 Completed Course at Unknown time  . clindamycin (CLEOCIN) 300 MG capsule Take 300 mg by mouth every 6 (six) hours. (Patient not taking: Reported on 02/07/2020)   Completed Course at Unknown time  . doxycycline (VIBRA-TABS) 100 MG tablet Take 1 tablet (100 mg total) by mouth 2 (two) times daily. (Patient not taking: Reported on 02/07/2020) 14 tablet 0 Completed Course at Unknown time  . HYDROcodone-acetaminophen (NORCO/VICODIN) 5-325 MG tablet Take 1 tablet by mouth every 6 (six)  hours as needed for moderate pain. (Patient not taking: Reported on 02/07/2020) 6 tablet 0 Completed Course at Unknown time  . lidocaine (XYLOCAINE) 2 % solution Use as directed 10 mLs in the mouth or throat every 4 (four) hours as needed for mouth pain. Swish and spit (Patient not taking: Reported on 02/07/2020) 200 mL 0 Completed Course at Unknown time  . oxyCODONE-acetaminophen (PERCOCET) 10-325 MG tablet Take 1 tablet by mouth every 6 (six) hours as needed for pain. (Patient not taking: Reported on 02/07/2020) 16 tablet 0 Completed Course at Unknown time   Scheduled: . alum & mag hydroxide-simeth  30 mL Oral Once   And  . lidocaine  15 mL Oral Once  . chlordiazePOXIDE  25 mg Oral TID  . dicyclomine  10 mg Oral Once  . enoxaparin (LOVENOX) injection  40 mg Subcutaneous Q24H  . multivitamin with minerals  1 tablet Oral Daily  . [START ON 02/11/2020] pantoprazole  40 mg Intravenous Q12H  . thiamine  100 mg Intramuscular Once  . thiamine  100 mg  Oral Daily   Continuous: . sodium chloride 100 mL/hr at 02/09/20 1029  . pantoprozole (PROTONIX) infusion 8 mg/hr (02/09/20 0101)   MHD:[id number], alum & mag hydroxide-simeth, HYDROcodone-acetaminophen, hydrOXYzine, labetalol, lidocaine, loperamide, LORazepam **OR** LORazepam, LORazepam, ondansetron (ZOFRAN) IV, ondansetron, oxyCODONE-acetaminophen **AND** oxyCODONE, phenol Anti-infectives (From admission, onward)   None     Scheduled Meds: . alum & mag hydroxide-simeth  30 mL Oral Once   And  . lidocaine  15 mL Oral Once  . chlordiazePOXIDE  25 mg Oral TID  . dicyclomine  10 mg Oral Once  . enoxaparin (LOVENOX) injection  40 mg Subcutaneous Q24H  . multivitamin with minerals  1 tablet Oral Daily  . [START ON 02/11/2020] pantoprazole  40 mg Intravenous Q12H  . thiamine  100 mg Intramuscular Once  . thiamine  100 mg Oral Daily   Continuous Infusions: . sodium chloride 100 mL/hr at 02/09/20 1029  . pantoprozole (PROTONIX) infusion 8 mg/hr  (02/09/20 0101)   PRN Meds:.acetaminophen, alum & mag hydroxide-simeth, HYDROcodone-acetaminophen, hydrOXYzine, labetalol, lidocaine, loperamide, LORazepam **OR** LORazepam, LORazepam, ondansetron (ZOFRAN) IV, ondansetron, oxyCODONE-acetaminophen **AND** oxyCODONE, phenol   Assessment: Principal Problem:   Acute alcoholic hepatitis Active Problems:   Severe anxiety with panic   Esophagitis   Alcohol dependence with uncomplicated withdrawal (HCC)   Gastritis  Acute hepatitis: With worsening transaminases AST 2070 >> 8070 ALT 1383 >>4438 T Bili 1 >>1.7  Plan: Patient does not manifest signs or symptoms of acute liver failure at this time Monitor daily coags, monitor for any altered mental status Closely monitor daily LFTs Due to significantly elevated transaminases, recommend to rule out other viral etiology such as EBV, CMV, HSV Avoid hepatotoxic agents Diet as tolerated, encourage high-protein diet Monitor for alcohol withdrawal, on CIWA protocol Recommend to start gabapentin 100 mg 3 times daily for alcohol dependence  GI will follow along with you   LOS: 2 days   Mathilda Maguire 02/09/2020, 3:38 PM

## 2020-02-10 DIAGNOSIS — K7010 Alcoholic hepatitis without ascites: Principal | ICD-10-CM

## 2020-02-10 DIAGNOSIS — F10231 Alcohol dependence with withdrawal delirium: Secondary | ICD-10-CM

## 2020-02-10 LAB — COMPREHENSIVE METABOLIC PANEL
ALT: 1624 U/L — ABNORMAL HIGH (ref 0–44)
AST: 877 U/L — ABNORMAL HIGH (ref 15–41)
Albumin: 3.3 g/dL — ABNORMAL LOW (ref 3.5–5.0)
Alkaline Phosphatase: 86 U/L (ref 38–126)
Anion gap: 10 (ref 5–15)
BUN: 14 mg/dL (ref 6–20)
CO2: 22 mmol/L (ref 22–32)
Calcium: 7.9 mg/dL — ABNORMAL LOW (ref 8.9–10.3)
Chloride: 103 mmol/L (ref 98–111)
Creatinine, Ser: 0.78 mg/dL (ref 0.61–1.24)
GFR calc Af Amer: >60 mL/min (ref >60)
GFR calc non Af Amer: >60 mL/min (ref >60)
Glucose, Bld: 110 mg/dL — ABNORMAL HIGH (ref 70–99)
Potassium: 3.3 mmol/L — ABNORMAL LOW (ref 3.5–5.1)
Sodium: 135 mmol/L (ref 135–145)
Total Bilirubin: 1.3 mg/dL — ABNORMAL HIGH (ref 0.3–1.2)
Total Protein: 6 g/dL — ABNORMAL LOW (ref 6.5–8.1)

## 2020-02-10 LAB — EPSTEIN-BARR VIRUS (EBV) ANTIBODY PROFILE
EBV NA IgG: 292 U/mL — ABNORMAL HIGH (ref 0.0–17.9)
EBV VCA IgG: 193 U/mL — ABNORMAL HIGH (ref 0.0–17.9)
EBV VCA IgM: <36 U/mL (ref 0.0–35.9)

## 2020-02-10 LAB — URINE DRUG SCREEN, QUALITATIVE (ARMC ONLY)
Amphetamines, Ur Screen: NOT DETECTED
Barbiturates, Ur Screen: POSITIVE — AB
Benzodiazepine, Ur Scrn: POSITIVE — AB
Cannabinoid 50 Ng, Ur ~~LOC~~: NOT DETECTED
Cocaine Metabolite,Ur ~~LOC~~: NOT DETECTED
MDMA (Ecstasy)Ur Screen: NOT DETECTED
Methadone Scn, Ur: NOT DETECTED
Opiate, Ur Screen: NOT DETECTED
Phencyclidine (PCP) Ur S: NOT DETECTED
Tricyclic, Ur Screen: NOT DETECTED

## 2020-02-10 LAB — PROTIME-INR
INR: 1 (ref 0.8–1.2)
Prothrombin Time: 13 seconds (ref 11.4–15.2)

## 2020-02-10 LAB — MAGNESIUM: Magnesium: 2.1 mg/dL (ref 1.7–2.4)

## 2020-02-10 LAB — MRSA PCR SCREENING: MRSA by PCR: NEGATIVE

## 2020-02-10 LAB — GLUCOSE, CAPILLARY: Glucose-Capillary: 126 mg/dL — ABNORMAL HIGH (ref 70–99)

## 2020-02-10 LAB — CMV IGM: CMV IgM: <30 AU/mL (ref 0.0–29.9)

## 2020-02-10 MED ORDER — POTASSIUM CHLORIDE CRYS ER 20 MEQ PO TBCR: 40.0000 meq | EXTENDED_RELEASE_TABLET | Freq: Once | ORAL | Status: DC

## 2020-02-10 MED ORDER — DEXMEDETOMIDINE HCL IN NACL 400 MCG/100ML IV SOLN
0.4000 ug/kg/h | INTRAVENOUS | Status: DC
  Administered 2020-02-10 (×2): 1.2 ug/kg/h via INTRAVENOUS
  Administered 2020-02-10: 0.9 ug/kg/h via INTRAVENOUS
  Administered 2020-02-11: 0.8 ug/kg/h via INTRAVENOUS
  Administered 2020-02-11: 1.2 ug/kg/h via INTRAVENOUS
  Administered 2020-02-11: 0.4 ug/kg/h via INTRAVENOUS
  Administered 2020-02-11: 1.2 ug/kg/h via INTRAVENOUS
  Administered 2020-02-12: 0.4 ug/kg/h via INTRAVENOUS
  Filled 2020-02-10 (×10): qty 100

## 2020-02-10 MED ORDER — TRAZODONE HCL 50 MG PO TABS
25.0000 mg | ORAL_TABLET | Freq: Every day | ORAL | Status: DC
  Administered 2020-02-10 – 2020-02-12 (×3): 50 mg via ORAL
  Filled 2020-02-10 (×3): qty 1

## 2020-02-10 MED ORDER — MIDAZOLAM HCL 5 MG/5ML IJ SOLN
4.0000 mg | INTRAMUSCULAR | Status: AC
  Administered 2020-02-10: 4 mg via INTRAVENOUS
  Filled 2020-02-10: qty 4

## 2020-02-10 MED ORDER — PHENOBARBITAL SODIUM 65 MG/ML IJ SOLN
65.0000 mg | Freq: Once | INTRAMUSCULAR | Status: AC
  Administered 2020-02-10: 65 mg via INTRAVENOUS
  Filled 2020-02-10: qty 1

## 2020-02-10 MED ORDER — CHLORHEXIDINE GLUCONATE CLOTH 2 % EX PADS
6.0000 | MEDICATED_PAD | Freq: Every day | CUTANEOUS | Status: DC
  Administered 2020-02-10 – 2020-02-11 (×2): 6 via TOPICAL

## 2020-02-10 MED ORDER — LACTULOSE 10 GM/15ML PO SOLN
30.0000 g | Freq: Two times a day (BID) | ORAL | Status: DC
  Administered 2020-02-11 (×2): 30 g via ORAL
  Filled 2020-02-10 (×2): qty 60

## 2020-02-10 MED ORDER — THIAMINE HCL 100 MG/ML IJ SOLN
500.0000 mg | Freq: Every day | INTRAVENOUS | Status: DC
  Administered 2020-02-10 – 2020-02-12 (×3): 500 mg via INTRAVENOUS
  Filled 2020-02-10 (×5): qty 5

## 2020-02-10 MED ORDER — DIAZEPAM 5 MG/ML IJ SOLN
5.0000 mg | Freq: Once | INTRAMUSCULAR | Status: AC
  Administered 2020-02-10: 5 mg via INTRAVENOUS
  Filled 2020-02-10: qty 2

## 2020-02-10 MED ORDER — FENTANYL CITRATE (PF) 100 MCG/2ML IJ SOLN
50.0000 ug | Freq: Once | INTRAMUSCULAR | Status: AC
  Administered 2020-02-10: 50 ug via INTRAVENOUS
  Filled 2020-02-10: qty 2

## 2020-02-10 MED ORDER — HYDRALAZINE HCL 20 MG/ML IJ SOLN
10.0000 mg | INTRAMUSCULAR | Status: DC | PRN
  Administered 2020-02-10 – 2020-02-11 (×3): 10 mg via INTRAVENOUS
  Filled 2020-02-10 (×3): qty 1

## 2020-02-10 MED ORDER — DIPHENHYDRAMINE HCL 50 MG/ML IJ SOLN
50.0000 mg | Freq: Once | INTRAMUSCULAR | Status: AC
  Administered 2020-02-10: 50 mg via INTRAVENOUS
  Filled 2020-02-10: qty 1

## 2020-02-10 MED ORDER — DEXMEDETOMIDINE HCL IN NACL 400 MCG/100ML IV SOLN
0.2000 ug/kg/h | INTRAVENOUS | Status: DC
  Administered 2020-02-10 (×2): 0.7 ug/kg/h via INTRAVENOUS
  Filled 2020-02-10 (×2): qty 100

## 2020-02-10 MED ORDER — POTASSIUM CHLORIDE 10 MEQ/100ML IV SOLN
10.0000 meq | INTRAVENOUS | Status: AC
  Administered 2020-02-10 (×4): 10 meq via INTRAVENOUS
  Filled 2020-02-10 (×4): qty 100

## 2020-02-10 MED ORDER — DEXMEDETOMIDINE HCL IN NACL 200 MCG/50ML IV SOLN
0.2000 ug/kg/h | INTRAVENOUS | Status: DC
  Filled 2020-02-10: qty 50

## 2020-02-10 NOTE — Progress Notes (Signed)
BP 171/121 administered IV labetolol 20mg 

## 2020-02-10 NOTE — Progress Notes (Signed)
Arlyss Repress, MD [address]  [address]  [place], [place] [address]  Main: [phone]  Fax: [phone] Pager: [phone]   Subjective: Patient is transferred to ICU secondary to worsening of delirium.  He is currently on Precedex drip.  Awake but not answering questions appropriately. He did not have any bowel movements, no witnessed episodes of melena, rectal bleeding or nausea, vomiting or hematemesis   Objective: Vital signs in last 24 hours: Vitals:   02/10/20 0821 02/10/20 1000 02/10/20 1100 02/10/20 1400  BP: (!) 148/102 (!) 142/107 (!) 136/92 (!) 148/107  Pulse: 88 90 79 74  Resp:  16 16 18   Temp: 97.7 F (36.5 C) 98.4 F (36.9 C)    TempSrc: Oral Axillary    SpO2: 99% 100% 95% 96%  Weight:      Height:       Weight change:   Intake/Output Summary (Last 24 hours) at 02/10/2020 1505 Last data filed at 02/10/2020 1340 Gross per 24 hour  Intake 440.03 ml  Output 1350 ml  Net -909.97 ml     Exam: Heart: Tachycardia Lungs: normal and clear to auscultation Abdomen: soft, nontender, normal bowel sounds   Lab Results: CBC Latest Ref Rng & Units 02/08/2020 02/08/2020 02/08/2020  WBC 4.0 - 10.5 K/uL - - -  Hemoglobin 13.0 - 17.0 g/dL 02/10/2020 12.2 48.2  Hematocrit 39 - 52 % 44.9 50.4 49.5  Platelets 150 - 400 K/uL - - -   CMP Latest Ref Rng & Units 02/10/2020 02/09/2020 02/08/2020  Glucose 70 - 99 mg/dL 02/10/2020) 370(W) 888(B)  BUN 6 - 20 mg/dL 14 20 169(I)  Creatinine 0.61 - 1.24 mg/dL 50(T 8.88 2.80  Sodium 135 - 145 mmol/L 135 134(L) 137  Potassium 3.5 - 5.1 mmol/L 3.3(L) 3.8 4.5  Chloride 98 - 111 mmol/L 103 104 101  CO2 22 - 32 mmol/L 22 23 21(L)  Calcium 8.9 - 10.3 mg/dL 7.9(L) 8.1(L) 7.6(L)  Total Protein 6.5 - 8.1 g/dL 6.0(L) 5.7(L) 6.8  Total Bilirubin 0.3 - 1.2 mg/dL 0.34) 9.1(P) 9.1(T)  Alkaline Phos 38 - 126 U/L 86 87 81  AST 15 - 41 U/L 877(H) 1,823(H) 8,070(H)  ALT 0 - 44 U/L 1,624(H) 2,269(H) 4,438(H)    Micro Results: Recent  Results (from the past 240 hour(s))  SARS Coronavirus 2 by RT PCR (hospital order, performed in [facility] hospital lab) Nasopharyngeal Nasopharyngeal Swab     Status: None   Collection Time: 02/07/20  7:44 PM   Specimen: Nasopharyngeal Swab  Result Value Ref Range Status   SARS Coronavirus 2 NEGATIVE NEGATIVE Final    Comment: (NOTE) SARS-CoV-2 target nucleic acids are NOT DETECTED.  The SARS-CoV-2 RNA is generally detectable in upper and lower respiratory specimens during the acute phase of infection. The lowest concentration of SARS-CoV-2 viral copies this assay can detect is 250 copies / mL. A negative result does not preclude SARS-CoV-2 infection and should not be used as the sole basis for treatment or other patient management decisions.  A negative result may occur with improper specimen collection / handling, submission of specimen other than nasopharyngeal swab, presence of viral mutation(s) within the areas targeted by this assay, and inadequate number of viral copies (<250 copies / mL). A negative result must be combined with clinical observations, patient history, and epidemiological information.  Fact Sheet for Patients:   02/09/20  Fact Sheet for Healthcare Providers: BoilerBrush.com.cy  This test is not yet approved or  cleared by the  [place] [employer] and has been authorized for detection and/or diagnosis of SARS-CoV-2 by FDA under an TEFL teacher (EUA).  This EUA will remain in effect (meaning this test can be used) for the duration of the COVID-19 declaration under Section 564(b)(1) of the Act, 21 U.S.C. section 360bbb-3(b)(1), unless the authorization is terminated or revoked sooner.  Performed at [facility], [address] [place], [place] [address]   MRSA PCR Screening     Status: None   Collection Time: 02/10/20 10:03 AM   Specimen: Nasopharyngeal  Result Value Ref Range  Status   MRSA by PCR NEGATIVE NEGATIVE Final    Comment:        The GeneXpert MRSA Assay (FDA approved for NASAL specimens only), is one component of a comprehensive MRSA colonization surveillance program. It is not intended to diagnose MRSA infection nor to guide or monitor treatment for MRSA infections. Performed at [facility], [address] [place], [place] [address]    Studies/Results: No results found. Medications:  I have reviewed the patient's current medications. Prior to Admission:  Medications Prior to Admission  Medication Sig Dispense Refill Last Dose  . chlordiazePOXIDE (LIBRIUM) 10 MG capsule Day 1-2: Take 1 tablet p.o. every 8 hours. Day 3-4: Take 1 tablet p.o. every 12 hours. Day 5-6: Take 1 tablet p.o. daily. Day 7: Stop (Patient not taking: Reported on 02/07/2020) 30 capsule 0 Completed Course at Unknown time  . clindamycin (CLEOCIN) 150 MG capsule 2 caps tid until finished (Patient not taking: Reported on 02/07/2020) 42 capsule 0 Completed Course at Unknown time  . clindamycin (CLEOCIN) 300 MG capsule Take 300 mg by mouth every 6 (six) hours. (Patient not taking: Reported on 02/07/2020)   Completed Course at Unknown time  . doxycycline (VIBRA-TABS) 100 MG tablet Take 1 tablet (100 mg total) by mouth 2 (two) times daily. (Patient not taking: Reported on 02/07/2020) 14 tablet 0 Completed Course at Unknown time  . HYDROcodone-acetaminophen (NORCO/VICODIN) 5-325 MG tablet Take 1 tablet by mouth every 6 (six) hours as needed for moderate pain. (Patient not taking: Reported on 02/07/2020) 6 tablet 0 Completed Course at Unknown time  . lidocaine (XYLOCAINE) 2 % solution Use as directed 10 mLs in the mouth or throat every 4 (four) hours as needed for mouth pain. Swish and spit (Patient not taking: Reported on 02/07/2020) 200 mL 0 Completed Course at Unknown time  . oxyCODONE-acetaminophen (PERCOCET) 10-325 MG tablet Take 1 tablet by mouth every 6 (six) hours as  needed for pain. (Patient not taking: Reported on 02/07/2020) 16 tablet 0 Completed Course at Unknown time   Scheduled: . alum & mag hydroxide-simeth  30 mL Oral Once   And  . lidocaine  15 mL Oral Once  . chlordiazePOXIDE  25 mg Oral TID  . Chlorhexidine Gluconate Cloth  6 each Topical Daily  . dicyclomine  10 mg Oral Once  . enoxaparin (LOVENOX) injection  40 mg Subcutaneous Q24H  . multivitamin with minerals  1 tablet Oral Daily  . nicotine  21 mg Transdermal Daily  . [START ON 02/11/2020] pantoprazole  40 mg Intravenous Q12H  . thiamine  100 mg Intramuscular Once  . thiamine  100 mg Oral Daily  . traZODone  25-50 mg Oral QHS   Continuous: . sodium chloride 100 mL/hr at 02/10/20 1001  . dexmedetomidine (PRECEDEX) IV infusion 0.7 mcg/kg/hr (02/10/20 1327)  . pantoprozole (PROTONIX) infusion 8 mg/hr (02/09/20 0101)  . potassium chloride 10 mEq (02/10/20  1440)   ZOX:[id number], alum & mag hydroxide-simeth, HYDROcodone-acetaminophen, hydrOXYzine, labetalol, lidocaine, loperamide, LORazepam **OR** LORazepam, LORazepam, ondansetron (ZOFRAN) IV, ondansetron, oxyCODONE-acetaminophen **AND** oxyCODONE, phenol Anti-infectives (From admission, onward)   None     Scheduled Meds: . alum & mag hydroxide-simeth  30 mL Oral Once   And  . lidocaine  15 mL Oral Once  . chlordiazePOXIDE  25 mg Oral TID  . Chlorhexidine Gluconate Cloth  6 each Topical Daily  . dicyclomine  10 mg Oral Once  . enoxaparin (LOVENOX) injection  40 mg Subcutaneous Q24H  . multivitamin with minerals  1 tablet Oral Daily  . nicotine  21 mg Transdermal Daily  . [START ON 02/11/2020] pantoprazole  40 mg Intravenous Q12H  . thiamine  100 mg Intramuscular Once  . thiamine  100 mg Oral Daily  . traZODone  25-50 mg Oral QHS   Continuous Infusions: . sodium chloride 100 mL/hr at 02/10/20 1001  . dexmedetomidine (PRECEDEX) IV infusion 0.7 mcg/kg/hr (02/10/20 1327)  . pantoprozole (PROTONIX) infusion 8 mg/hr (02/09/20  0101)  . potassium chloride 10 mEq (02/10/20 1440)   PRN Meds:.acetaminophen, alum & mag hydroxide-simeth, HYDROcodone-acetaminophen, hydrOXYzine, labetalol, lidocaine, loperamide, LORazepam **OR** LORazepam, LORazepam, ondansetron (ZOFRAN) IV, ondansetron, oxyCODONE-acetaminophen **AND** oxyCODONE, phenol   Assessment: Principal Problem:   Acute alcoholic hepatitis Active Problems:   Severe anxiety with panic   Esophagitis   Alcohol dependence with uncomplicated withdrawal (HCC)   Gastritis  Acute alcoholic hepatitis: LFTs are improving  Plan: Altered mental status: Likely secondary to alcohol intoxication/withdrawal Trial of lactulose 2-3 times daily EBV, CMV, HSV serologies are pending Avoid hepatotoxic agents Diet as tolerated, encourage high-protein diet Monitor for alcohol withdrawal, on CIWA protocol Recommend to start gabapentin 100 mg 3 times daily for alcohol dependence    LOS: 3 days   Vayda Dungee 02/10/2020, 3:05 PM

## 2020-02-10 NOTE — Progress Notes (Signed)
Patient with a CIWA of 22 and has received 18mg  of ativan in the last 24 hours so far. In addition patient was given 50mg  of trazodone. Patient is adamantly trying to get ut of bed without assistance and has an unsteady gait. Patient is requesting to leave AMA so he can smoke. RN administered nicoteine patch as ordered earlier this shift. Dr. made aware, New orders received for Fentanyl and valium. Will continue to monitor and endorse.

## 2020-02-10 NOTE — Progress Notes (Signed)
Patient came this AM to ICU from floor requiring precedex drip for detoxification. BP elevated but is controlled now with prn hydralazine. Patient is resting comfortable in bed.

## 2020-02-10 NOTE — Progress Notes (Signed)
Spoke with Dr. Allegra Lai regarding patients mental/NPO status. Unable to get patient to cooperate for administration of lactulose rectally. Per Dr Allegra Lai do not give at this time and when patient is able to take oral, administer at that time.

## 2020-02-10 NOTE — Consult Note (Addendum)
Reason for Consult: Patient in alcohol withdrawal needing Precedex infusion.  Referring Physician: Esaw Grandchild, DO  Zachary Mendoza. is an 29 y.o. male.  HPI: History is obtained from the records as the patient is currently dated with Precedex and cannot provide history. [name] [name], who presented to the ED on 02/07/20 with burning chest painwith associated nausea and vomiting occasional coffee-ground emesis, melena without bright red bleeding per rectum. He reported drinking about 1 pint liquor daily, 2 pints that day, and expressed interest in detox at the time of admission. He was having early signs of withdrawal at the time of admission. He has been noted to have an alcoholic hepatitis without overt liver failure. He became more delirious and required transfer to the stepdown unit for Precedex infusion.  Past Medical History:  Diagnosis Date  . Hypertension   . Panic attacks     Past Surgical History:  Procedure Laterality Date  . ESOPHAGOGASTRODUODENOSCOPY (EGD) WITH PROPOFOL N/A 10/04/2019   Procedure: ESOPHAGOGASTRODUODENOSCOPY (EGD) WITH PROPOFOL;  Surgeon: Midge Minium, MD;  Location: [facility] ENDOSCOPY;  Service: Endoscopy;  Laterality: N/A;    Family History  Problem Relation Age of Onset  . Hypertension Mother     Social History   Tobacco Use  . Smoking status: Current Every Day Smoker    Packs/day: 1.00    Types: Cigarettes  . Smokeless tobacco: Never Used  Substance Use Topics  . Alcohol use: Not Currently    Comment: about week ago   See HPI for actual alcohol intake reported.  Allergies:  Allergies  Allergen Reactions  . Penicillins Hives    Pt states he is not allergic, but that his sister was.    Medications: I have reviewed the patient's current medications. Scheduled Meds: . alum & mag hydroxide-simeth  30 mL Oral  Once   And  . lidocaine  15 mL Oral Once  . chlordiazePOXIDE  25 mg Oral TID  . Chlorhexidine Gluconate Cloth  6 each Topical Daily  . dicyclomine  10 mg Oral Once  . enoxaparin (LOVENOX) injection  40 mg Subcutaneous Q24H  . lactulose  30 g Oral BID  . multivitamin with minerals  1 tablet Oral Daily  . nicotine  21 mg Transdermal Daily  . [START ON 02/11/2020] pantoprazole  40 mg Intravenous Q12H  . traZODone  25-50 mg Oral QHS   Continuous Infusions: . sodium chloride 100 mL/hr at 02/10/20 1001  . dexmedetomidine (PRECEDEX) IV infusion 0.9 mcg/kg/hr (02/10/20 1743)  . pantoprozole (PROTONIX) infusion 8 mg/hr (02/09/20 0101)  . thiamine injection     PRN Meds:.acetaminophen, alum & mag hydroxide-simeth, hydrALAZINE, HYDROcodone-acetaminophen, hydrOXYzine, labetalol, lidocaine, loperamide, LORazepam **OR** LORazepam, LORazepam, ondansetron (ZOFRAN) IV, ondansetron, oxyCODONE-acetaminophen **AND** oxyCODONE, phenol  Results for orders placed or performed during the hospital encounter of 02/07/20 (from the past 48 hour(s))  CMV IgM     Status: None   Collection Time: 02/09/20  2:29 PM  Result Value Ref Range   CMV IgM <30.0 0.0 - 29.9 AU/mL    Comment: (NOTE)                                Negative         <30.0  Equivocal  30.0 - 34.9                                Positive         >34.9 A positive result is generally indicative of acute infection, reactivation or persistent IgM production. Performed At: [facility] [address] [place], [place] [id number] Jolene Schimke MD OZ:[id number]   EPSTEIN-BARR VIRUS (EBV) Antibody Profile     Status: Abnormal   Collection Time: 02/09/20  2:29 PM  Result Value Ref Range   EBV VCA IgM <36.0 0.0 - 35.9 U/mL    Comment: (NOTE)                                 Negative        <36.0                                 Equivocal 36.0 - 43.9                                 Positive        >43.9    EBV  VCA IgG 193.0 (H) 0.0 - 17.9 U/mL    Comment: (NOTE)                                 Negative        <18.0                                 Equivocal 18.0 - 21.9                                 Positive        >21.9    EBV NA IgG 292.0 (H) 0.0 - 17.9 U/mL    Comment: (NOTE)                                 Negative        <18.0                                 Equivocal 18.0 - 21.9                                 Positive        >21.9    Interpretation: Comment     Comment: (NOTE)               EBV Interpretation Chart Key: Antibody Present +    Antibody Absent - Interpretation             VCA-IgM   VCA-IgG  EBNA-IgG No previous infection/        -         -         - Susceptible Primary infection (new        +         +         -  or recent) Past Infection               +or-       +         + See comment below*            +         -         - *Results indicate infection with EBV at some time however cannot predict the timing of the infection since antibodies to EBNA usually develop after primary infection or, alternatively, approximately 5-10% of patients with EBV never develop antibodies to EBNA. Performed At: [facility] [address] [place], [place] [id number] Jolene Schimke MD [id number]   Comprehensive metabolic panel     Status: Abnormal   Collection Time: 02/09/20  2:29 PM  Result Value Ref Range   Sodium 134 (L) 135 - 145 mmol/L   Potassium 3.8 3.5 - 5.1 mmol/L   Chloride 104 98 - 111 mmol/L   CO2 23 22 - 32 mmol/L   Glucose, Bld 142 (H) 70 - 99 mg/dL    Comment: Glucose reference range applies only to samples taken after fasting for at least 8 hours.   BUN 20 6 - 20 mg/dL   Creatinine, Ser 9.76 0.61 - 1.24 mg/dL   Calcium 8.1 (L) 8.9 - 10.3 mg/dL   Total Protein 5.7 (L) 6.5 - 8.1 g/dL   Albumin 3.2 (L) 3.5 - 5.0 g/dL   AST 7,341 (H) 15 - 41 U/L   ALT 2,269 (H) 0 - 44 U/L    Comment: RESULT CONFIRMED BY MANUAL DILUTION SKL   Alkaline Phosphatase  87 38 - 126 U/L   Total Bilirubin 1.7 (H) 0.3 - 1.2 mg/dL   GFR calc non Af Amer >60 >60 mL/min   GFR calc Af Amer >60 >60 mL/min   Anion gap 7 5 - 15    Comment: Performed at [facility], [address] [place], [place] [address]  Protime-INR     Status: None   Collection Time: 02/10/20  6:07 AM  Result Value Ref Range   Prothrombin Time 13.0 11.4 - 15.2 seconds   INR 1.0 0.8 - 1.2    Comment: (NOTE) INR goal varies based on device and disease states. Performed at [facility], [address] [place], [place] [address]   Comprehensive metabolic panel     Status: Abnormal   Collection Time: 02/10/20  6:07 AM  Result Value Ref Range   Sodium 135 135 - 145 mmol/L   Potassium 3.3 (L) 3.5 - 5.1 mmol/L   Chloride 103 98 - 111 mmol/L   CO2 22 22 - 32 mmol/L   Glucose, Bld 110 (H) 70 - 99 mg/dL    Comment: Glucose reference range applies only to samples taken after fasting for at least 8 hours.   BUN 14 6 - 20 mg/dL   Creatinine, Ser 3.53 0.61 - 1.24 mg/dL   Calcium 7.9 (L) 8.9 - 10.3 mg/dL   Total Protein 6.0 (L) 6.5 - 8.1 g/dL   Albumin 3.3 (L) 3.5 - 5.0 g/dL   AST 299 (H) 15 - 41 U/L   ALT 1,624 (H) 0 - 44 U/L   Alkaline Phosphatase 86 38 - 126 U/L   Total Bilirubin 1.3 (H) 0.3 - 1.2 mg/dL   GFR calc non Af Amer >60 >60 mL/min   GFR calc Af Amer >60 >60 mL/min   Anion gap 10 5 -  15    Comment: Performed at [facility], [address] [place], [place] [address]  Glucose, capillary     Status: Abnormal   Collection Time: 02/10/20  9:57 AM  Result Value Ref Range   Glucose-Capillary 126 (H) 70 - 99 mg/dL    Comment: Glucose reference range applies only to samples taken after fasting for at least 8 hours.  MRSA PCR Screening     Status: None   Collection Time: 02/10/20 10:03 AM   Specimen: Nasopharyngeal  Result Value Ref Range   MRSA by PCR NEGATIVE NEGATIVE    Comment:        The GeneXpert MRSA Assay (FDA approved for NASAL  specimens only), is one component of a comprehensive MRSA colonization surveillance program. It is not intended to diagnose MRSA infection nor to guide or monitor treatment for MRSA infections. Performed at [facility], [address] [place], [place] [address]     No results found.  Review of Systems  Patient is unable to provide review of systems due to being sedated with Precedex.   Physical Exam VS: Blood pressure (!) 161/116, pulse 72, temperature 98.4 F (36.9 C), temperature source Axillary, resp. rate 20, height 5\' 7"  (1.702 m), weight 114.9 kg, SpO2 95 %.  GENERAL: Sedated on Precedex, no respiratory distress sleeping. HEAD: Normocephalic, atraumatic.  EYES: Pupils equal, round, reactive to light.  No scleral icterus.  MOUTH: Oral mucosa moist, no thrush. NECK: Supple. No thyromegaly. Trachea midline. No JVD.  No adenopathy. PULMONARY: Good air entry bilaterally.  No adventitious sounds. CARDIOVASCULAR: S1 and S2. Regular rate and rhythm. No rubs, murmurs or gallops heard. ABDOMEN: Nondistended. Normoactive bowel sounds. Palpation not performed not to disturb the patient as he had been delirious previously. MUSCULOSKELETAL: No joint deformity, no clubbing, no edema.  NEUROLOGIC: Sedated on Precedex, no further assessment can be made SKIN: Intact,warm,dry. PSYCH: Cannot assess, patient on Precedex  Assessment/Plan:  Acute alcohol withdrawal delirium Continue Precedex infusion Continue CIWA High-dose thiamine Electrolyte repletion as needed  Alcoholic hepatitis Transaminases on downward trend GI following Supportive care Lactulose when able to take p.o.  Gastritis/esophagitis Patient reported hematemesis/melena PTA Continue pantoprazole twice daily IV No further evidence of bleeding Monitor H&H  Hypertension Likely due to alcohol withdrawal Labetalol and hydralazine as needed Precedex should also help  We will continue to follow along  while in stepdown. Patient will remain on hospitalist service. Consider starting DVT prophylaxis if no other contraindications noted.  Gailen Shelter. Laura Jhanae Jaskowiak, MD [place] [facility] 02/10/2020, 5:46 PM    *This note was dictated using voice recognition software/Dragon.  Despite best efforts to proofread, errors can occur which can change the meaning.  Any change was purely unintentional.

## 2020-02-10 NOTE — Progress Notes (Signed)
PROGRESS NOTE    Zachary L Sweatman Jr.   AST:[id number]  DOB: February 22, 1991  PCP: [facility]    DOA: 02/07/2020 LOS: 3   Brief Narrative   Zachary Mendoza  is a 29 y.o. Caucasian male with a known history of hypertension and panic attacks as well as alcohol and tobacco abuse and elevated LFTs, who presented to the ED on 02/07/20 with burning chest pain with associated nausea and vomiting occasional coffee-ground emesis, melena without bright red bleeding per rectum.  Reported drinking about 1 pint liquor daily, 2 pints that day, and expressed interest in detox.  ED Course: HR 128 with otherwise normal vitals.  Initial labs notable for AST 2070, ALT 1382 (both up from 64 and 92 respectively a month ago), INR 1.5, other LFT's within normal.  EtOH level was 356.  CBC was unremarkable except for hemoconcentration.   Right upper quadrant ultrasound showed fatty liver.   Treated with GI cocktail, Bentyl and IV fluids in the ED. Admitted to hospitalist service with GI consulted for further evaluation and management.  8/29: Withdrawal escalated despite Ativan and Librium. Transferred to ICU for Precedex drip.     Assessment & Plan   Principal Problem:   Acute alcoholic hepatitis Active Problems:   Alcohol dependence with uncomplicated withdrawal (HCC)   Gastritis   Esophagitis   Severe anxiety with panic   Acute alcoholic hepatitis - present on admission.  Pt drinks a pint of vodka daily.  RUQ U/S showed fatty liver without evidence of cirrhosis at this point.  Hepatitis A/B/C were negative. AST on admission 2070 >> 8070 >> 1823. ALT on admission 1383 >> 4438 >> 2269. --GI consulted --Follow up other viral studies (EBV, CMV, HSV) --No indication for steroids at this time with discriminant function < 32 --Complete alcohol cessation --Avoid hepatoxins --CMP's daily to monitor, expect improvement  Alcohol dependence with uncomplicated withdrawal - drinks about a pint or more  of vodka daily, says he binge drinks.   No history of DT's or withdrawal seizures.  Has had some hallucinations in the past during detox. --Transfer to ICU, PCCM to follow for now.   --Precedex gtt --IV fluids --Folic acid and thiamine --Discussed naltrexone with patient and he is agreeable.  Acute hepatitis needs to resolve before this can be started (when aminotransferase levels not greater than 5 times the upper limit of normal).  --Would prescribe 50 mg natrexone PO daily on discharge, with psychiatry follow up to continue therapy  Gastritis and Esophagitis - chronic, seen on prior EGD in April 2021.  Secondary to daily EtOH most likely.   Reported hematemesis and melena at home prior to admission. --IV Protonix BID --GI following  Severe anxiety with panic - on Librium and PRN Ativan as above.  Was prescribed PRN Vistaril on discharge in April, hold off for now while getting benzo's for withdrawal.  Used to be on Lexapro but this is not on his med history this admission.    Hypertension -  Likely due to anxiety and EtOH withdrawal.  PRN labetalol.  Once past withdrawal, will evaluate if need for chronic therapy on discharge.    Tobacco abuse - counseled on importance of smoking cessation.  Obesity: Body mass index is 39.67 kg/m.  Complicates overall care and prognosis.  Counseled on importance of diet, exercise for weight loss and overall health.    DVT prophylaxis: enoxaparin (LOVENOX) injection 40 mg Start: 02/07/20 2200   Diet:  Diet Orders (From admission,  onward)    Start     Ordered   02/08/20 1436  Diet Heart Room service appropriate? Yes; Fluid consistency: Thin  Diet effective now       Question Answer Comment  Room service appropriate? Yes   Fluid consistency: Thin      02/08/20 1436            Code Status: Full Code    Subjective 02/10/20    Nursing staff report patient with rising CIWA scores and agitated, getting up out of bed despite Ativan and Librium.   I saw him briefly prior to transfer, and he was agitated, saying he wants to leave the hospital.  Currently lacks any insight into his condition.     Disposition Plan & Communication   Status is: Inpatient  Remains inpatient appropriate because:IV treatments appropriate due to intensity of illness or inability to take PO.  Ongoing management of alcohol withdrawal.   Dispo: The patient is from: Home              Anticipated d/c is to: Home              Anticipated d/c date is: 2 days              Patient currently is not medically stable to d/c.   Family Communication: Spoke to patient's sister who was visiting him this morning.  Had a long discussion about his course with alcoholism, addiction on both sides of his family, lost their mother to opioid addiction and patient and she were very close.  We discussed medication to help with sobriety, but importance of addressing the underlying anxiety/panic disorder as well.  She says he had been going to AA and doing well last time sober for several months, and hopes he will return.  Difficulty will be his social circle, his friends all drink and give him alcohol even when family try to keep him from it.   Consults, Procedures, Significant Events   Consultants:   GI  Procedures:   None  Antimicrobials:   None    Objective   Vitals:   02/10/20 1100 02/10/20 1400 02/10/20 1509 02/10/20 1600  BP: (!) 136/92 (!) 148/107 (!) 171/121 (!) 163/117  Pulse: 79 74 64 69  Resp: 16 18 19    Temp:      TempSrc:      SpO2: 95% 96% 97%   Weight:      Height:        Intake/Output Summary (Last 24 hours) at 02/10/2020 1635 Last data filed at 02/10/2020 1539 Gross per 24 hour  Intake 440.03 ml  Output 2450 ml  Net -2009.97 ml   Filed Weights   02/07/20 1829 02/10/20 0405  Weight: 106.6 kg 114.9 kg    Physical Exam: exam limited by patient's agitation  General exam: agitated, no acute distress Respiratory: symmetric chest rise,  normal respiratory effort Central nervous system: A&O x4. no gross focal neurologic deficits, normal speech Psychiatry: agitated mood, congruent affect, abnormal judgement and insight appear   Labs   Data Reviewed: I have personally reviewed following labs and imaging studies  CBC: Recent Labs  Lab 02/07/20 1831 02/08/20 0114 02/08/20 0712 02/08/20 1101 02/08/20 1713  WBC 7.5  --   --   --   --   HGB 17.2* 17.2* 16.5 16.7 15.1  HCT 49.8 48.5 49.5 50.4 44.9  MCV 83.3  --   --   --   --   PLT  325  --   --   --   --    Basic Metabolic Panel: Recent Labs  Lab 02/07/20 1831 02/08/20 0712 02/08/20 1101 02/09/20 1429 02/10/20 0607  NA 140  --  137 134* 135  K 3.7  --  4.5 3.8 3.3*  CL 101  --  101 104 103  CO2 24  --  21* 23 22  GLUCOSE 216*  --  114* 142* 110*  BUN 24*  --  26* 20 14  CREATININE 0.97  --  0.90 1.02 0.78  CALCIUM 8.6*  --  7.6* 8.1* 7.9*  MG  --   --  2.5*  --   --   PHOS  --  4.5  --   --   --    GFR: Estimated Creatinine Clearance: 162 mL/min (by C-G formula based on SCr of 0.78 mg/dL). Liver Function Tests: Recent Labs  Lab 02/07/20 1831 02/08/20 1101 02/09/20 1429 02/10/20 0607  AST 2,070* 8,070* 1,823* 877*  ALT 1,383* 4,438* 2,269* 1,624*  ALKPHOS 80 81 87 86  BILITOT 1.0 1.7* 1.7* 1.3*  PROT 7.7 6.8 5.7* 6.0*  ALBUMIN 4.4 3.9 3.2* 3.3*   Recent Labs  Lab 02/08/20 1101  LIPASE 56*   No results for input(s): AMMONIA in the last 168 hours. Coagulation Profile: Recent Labs  Lab 02/07/20 2128 02/10/20 0607  INR 1.5* 1.0   Cardiac Enzymes: No results for input(s): CKTOTAL, CKMB, CKMBINDEX, TROPONINI in the last 168 hours. BNP (last 3 results) No results for input(s): PROBNP in the last 8760 hours. HbA1C: No results for input(s): HGBA1C in the last 72 hours. CBG: Recent Labs  Lab 02/10/20 0957  GLUCAP 126*   Lipid Profile: No results for input(s): CHOL, HDL, LDLCALC, TRIG, CHOLHDL, LDLDIRECT in the last 72 hours. Thyroid  Function Tests: No results for input(s): TSH, T4TOTAL, FREET4, T3FREE, THYROIDAB in the last 72 hours. Anemia Panel: No results for input(s): VITAMINB12, FOLATE, FERRITIN, TIBC, IRON, RETICCTPCT in the last 72 hours. Sepsis Labs: No results for input(s): PROCALCITON, LATICACIDVEN in the last 168 hours.  Recent Results (from the past 240 hour(s))  SARS Coronavirus 2 by RT PCR (hospital order, performed in [facility] hospital lab) Nasopharyngeal Nasopharyngeal Swab     Status: None   Collection Time: 02/07/20  7:44 PM   Specimen: Nasopharyngeal Swab  Result Value Ref Range Status   SARS Coronavirus 2 NEGATIVE NEGATIVE Final    Comment: (NOTE) SARS-CoV-2 target nucleic acids are NOT DETECTED.  The SARS-CoV-2 RNA is generally detectable in upper and lower respiratory specimens during the acute phase of infection. The lowest concentration of SARS-CoV-2 viral copies this assay can detect is 250 copies / mL. A negative result does not preclude SARS-CoV-2 infection and should not be used as the sole basis for treatment or other patient management decisions.  A negative result may occur with improper specimen collection / handling, submission of specimen other than nasopharyngeal swab, presence of viral mutation(s) within the areas targeted by this assay, and inadequate number of viral copies (<250 copies / mL). A negative result must be combined with clinical observations, patient history, and epidemiological information.  Fact Sheet for Patients:   BoilerBrush.com.cy  Fact Sheet for Healthcare Providers: https://pope.com/  This test is not yet approved or  cleared by the [place] FDA and has been authorized for detection and/or diagnosis of SARS-CoV-2 by FDA under an Emergency Use Authorization (EUA).  This EUA will remain in effect (meaning this test  can be used) for the duration of the COVID-19 declaration under Section 564(b)(1)  of the Act, 21 U.S.C. section 360bbb-3(b)(1), unless the authorization is terminated or revoked sooner.  Performed at [facility], [address] [place], [place] [address]   MRSA PCR Screening     Status: None   Collection Time: 02/10/20 10:03 AM   Specimen: Nasopharyngeal  Result Value Ref Range Status   MRSA by PCR NEGATIVE NEGATIVE Final    Comment:        The GeneXpert MRSA Assay (FDA approved for NASAL specimens only), is one component of a comprehensive MRSA colonization surveillance program. It is not intended to diagnose MRSA infection nor to guide or monitor treatment for MRSA infections. Performed at [facility], [address] [place], [place] [address]       Imaging Studies   No results found.   Medications   Scheduled Meds: . alum & mag hydroxide-simeth  30 mL Oral Once   And  . lidocaine  15 mL Oral Once  . chlordiazePOXIDE  25 mg Oral TID  . Chlorhexidine Gluconate Cloth  6 each Topical Daily  . dicyclomine  10 mg Oral Once  . enoxaparin (LOVENOX) injection  40 mg Subcutaneous Q24H  . lactulose  30 g Oral BID  . multivitamin with minerals  1 tablet Oral Daily  . nicotine  21 mg Transdermal Daily  . [START ON 02/11/2020] pantoprazole  40 mg Intravenous Q12H  . thiamine  100 mg Intramuscular Once  . thiamine  100 mg Oral Daily  . traZODone  25-50 mg Oral QHS   Continuous Infusions: . sodium chloride 100 mL/hr at 02/10/20 1001  . dexmedetomidine (PRECEDEX) IV infusion    . pantoprozole (PROTONIX) infusion 8 mg/hr (02/09/20 0101)       LOS: 3 days    Time spent: 38 minutes with greater than 50% spent in direct patient contact and coordination of care.    Pennie BanterKelly A Talli Kimmer, DO [facility]  02/10/2020, 4:35 PM    If 7PM-7AM, please contact night-coverage. How to contact the [facility] Attending or Consulting provider 7A - 7P or covering provider during after hours 7P -7A, for this patient?    1. Check the care  team in [facility] and look for a) attending/consulting [facility] provider listed and b) the [facility] team listed 2. Log into [email or web address] and use [place]'s universal password to access. If you do not have the password, please contact the hospital operator. 3. Locate the [facility] provider you are looking for under [facility] and page to a number that you can be directly reached. 4. If you still have difficulty reaching the provider, please page the [facility] (Director on Call) for the Hospitalists listed on amion for assistance.

## 2020-02-10 NOTE — TOC Initial Note (Signed)
Transition of Care (TOC) - Initial/Assessment Note    Patient Details  Name: Zachary Mendoza. MRN: [id number] Date of Birth: Oct 05, 1990  Transition of Care [facility]) CM/SW Contact:    Shawn Route, RN Phone Number: 02/10/2020, 9:26 AM  Clinical Narrative:                 Spoke with sister and girlfriend on phone.  Patient lives with girlfriend who has to travel out of town for work occasionally.  REports patient has different type jobs he takes when he can.  He currently does not have insurance.  I spoke with sister and let her know I would set him up with medication management for any required medications at discharge.  Patient is independent and can normally take care of self and do his own errands, grocery shopping etc.   His sister and girlfriend are available to help if there is a need.  Expected Discharge Plan: Home/Self Care Barriers to Discharge: Continued Medical Work up   Patient Goals and CMS Choice        Expected Discharge Plan and Services Expected Discharge Plan: Home/Self Care   Discharge Planning Services: CM Consult Post Acute Care Choice: NA Living arrangements for the past 2 months: Single Family Home                                      Prior Living Arrangements/Services Living arrangements for the past 2 months: Single Family Home Lives with:: Domestic Partner   Do you feel safe going back to the place where you live?: Yes               Activities of Daily Living Home Assistive Devices/Equipment: None ADL Screening (condition at time of admission) Patient's cognitive ability adequate to safely complete daily activities?: Yes Is the patient deaf or have difficulty hearing?: No Does the patient have difficulty seeing, even when wearing glasses/contacts?: No Does the patient have difficulty concentrating, remembering, or making decisions?: No Patient able to express need for assistance with ADLs?: Yes Does the patient have difficulty  dressing or bathing?: No Independently performs ADLs?: Yes (appropriate for developmental age) Does the patient have difficulty walking or climbing stairs?: No Weakness of Legs: None Weakness of Arms/Hands: None  Permission Sought/Granted                  Emotional Assessment         Alcohol / Substance Use: Alcohol Use Psych Involvement: No (comment)  Admission diagnosis:  Acute hepatitis [B17.9] Hepatitis [K75.9] Elevated LFTs [R79.89] Alcoholic intoxication with complication [facility]) [F10.929] Patient Active Problem List   Diagnosis Date Noted  . Acute alcoholic hepatitis 02/08/2020  . Alcohol dependence with uncomplicated withdrawal (HCC) 02/08/2020  . Gastritis 02/08/2020  . Leukocytosis 10/04/2019  . Abnormal LFTs 10/04/2019  . Hematemesis 10/04/2019  . Nausea vomiting and diarrhea 10/04/2019  . Hypertension   . Tobacco abuse   . Abdominal pain   . Chest pain   . Esophagitis   . Alcohol abuse 03/29/2019  . Severe anxiety with panic 03/28/2019  . Alcoholic intoxication with complication [facility])    PCP:  [facility]:   [facility] [address] [place] - [address] [place] [address] Phone: [phone] Fax: [phone]  [facility] [address], [place] - [address]  [place] [address] Phone: [phone] Fax: [phone]  [id number] [place], [place] - [address] AT [facility] [address] [place] [address] Phone: [phone] Fax: [phone]     Social Determinants of Health (SDOH) Interventions    Readmission Risk Interventions Readmission Risk Prevention Plan 02/10/2020  Transportation Screening Complete  Medication Review (RN Care Manager) Complete  PCP or Specialist appointment within 3-5 days of discharge Complete  HRI or Home Care Consult Complete  Palliative Care Screening Not Applicable  Skilled  Nursing Facility Not Applicable  Some recent data might be hidden

## 2020-02-11 LAB — COMPREHENSIVE METABOLIC PANEL
ALT: 1182 U/L — ABNORMAL HIGH (ref 0–44)
AST: 406 U/L — ABNORMAL HIGH (ref 15–41)
Albumin: 3.6 g/dL (ref 3.5–5.0)
Alkaline Phosphatase: 83 U/L (ref 38–126)
Anion gap: 7 (ref 5–15)
BUN: 12 mg/dL (ref 6–20)
CO2: 25 mmol/L (ref 22–32)
Calcium: 8.5 mg/dL — ABNORMAL LOW (ref 8.9–10.3)
Chloride: 107 mmol/L (ref 98–111)
Creatinine, Ser: 0.83 mg/dL (ref 0.61–1.24)
GFR calc Af Amer: >60 mL/min (ref >60)
GFR calc non Af Amer: >60 mL/min (ref >60)
Glucose, Bld: 99 mg/dL (ref 70–99)
Potassium: 4 mmol/L (ref 3.5–5.1)
Sodium: 139 mmol/L (ref 135–145)
Total Bilirubin: 1.5 mg/dL — ABNORMAL HIGH (ref 0.3–1.2)
Total Protein: 6.6 g/dL (ref 6.5–8.1)

## 2020-02-11 LAB — PROTIME-INR
INR: 0.9 (ref 0.8–1.2)
Prothrombin Time: 12.1 seconds (ref 11.4–15.2)

## 2020-02-11 MED ORDER — FOLIC ACID 1 MG PO TABS
1.0000 mg | ORAL_TABLET | Freq: Every day | ORAL | Status: DC
  Administered 2020-02-12 – 2020-02-13 (×2): 1 mg via ORAL
  Filled 2020-02-11 (×2): qty 1

## 2020-02-11 NOTE — Progress Notes (Signed)
After further assessment and discussion of patient's  status and medical condition during multidisciplinary rounds, the plan is outlined as below:  Continue CIWA scale Continue standing dose Librium Wean off Precedex as tolerated Transfer to floor once off Precedex  C. Danice Goltz, MD  PCCM  *This note was dictated using voice recognition software/Dragon.  Despite best efforts to proofread, errors can occur which can change the meaning.  Any change was purely unintentional.

## 2020-02-11 NOTE — Progress Notes (Addendum)
VAST consult to obtain IV access. Upon arrival at bedside, noted pt with one working IV in place. Pt eating lunch and requesting to go to bathroom. Unable to find staff to assist pt; placed him on a bedpan and notified another nurse; also asked her to notifiy pt's nurse that VAST RN will return to place IV if needed.

## 2020-02-11 NOTE — Progress Notes (Addendum)
VAST RN has attempted multiple times unsuccessfully to reach pt's nurse, Verlon Au regarding pt's IV access. Spoke with Diplomatic Services operational officer who advised to try again at a later time d/t emergent situation at this time.

## 2020-02-11 NOTE — Progress Notes (Signed)
Zachary Minium, MD [facility]   [address], [address] [place], [place] [address] Phone: [phone] Fax : [phone]   Subjective: The patient is laying in bed and moans to stimulation but otherwise is not answering questions.  The patient's liver enzymes have started to come down.  His blood work was positive for past infection of EBV without any other abnormalities found.   Objective: Vital signs in last 24 hours: Vitals:   02/11/20 0900 02/11/20 1000 02/11/20 1100 02/11/20 1200  BP: 116/83 110/76 113/75 113/76  Pulse:  77 76   Resp: 18 20 15 20   Temp:      TempSrc:      SpO2:  96% 96%   Weight:      Height:       Weight change:   Intake/Output Summary (Last 24 hours) at 02/11/2020 1244 Last data filed at 02/11/2020 1000 Gross per 24 hour  Intake 4847.41 ml  Output 5450 ml  Net -602.59 ml     Exam: Heart:: Regular rate and rhythm, S1S2 present or without murmur or extra heart sounds Lungs: normal and clear to auscultation and percussion Abdomen: soft, nontender, normal bowel sounds   Lab Results: @LABTEST2 @ Micro Results: Recent Results (from the past 240 hour(s))  SARS Coronavirus 2 by RT PCR (hospital order, performed in [facility] hospital lab) Nasopharyngeal Nasopharyngeal Swab     Status: None   Collection Time: 02/07/20  7:44 PM   Specimen: Nasopharyngeal Swab  Result Value Ref Range Status   SARS Coronavirus 2 NEGATIVE NEGATIVE Final    Comment: (NOTE) SARS-CoV-2 target nucleic acids are NOT DETECTED.  The SARS-CoV-2 RNA is generally detectable in upper and lower respiratory specimens during the acute phase of infection. The lowest concentration of SARS-CoV-2 viral copies this assay can detect is 250 copies / mL. A negative result does not preclude SARS-CoV-2 infection and should not be used as the sole basis for treatment or other patient management decisions.  A negative result may occur with improper specimen collection / handling, submission of  specimen other than nasopharyngeal swab, presence of viral mutation(s) within the areas targeted by this assay, and inadequate number of viral copies (<250 copies / mL). A negative result must be combined with clinical observations, patient history, and epidemiological information.  Fact Sheet for Patients:   [facility]  Fact Sheet for Healthcare Providers: 02/09/20  This test is not yet approved or  cleared by the [employer] FDA and has been authorized for detection and/or diagnosis of SARS-CoV-2 by FDA under an Emergency Use Authorization (EUA).  This EUA will remain in effect (meaning this test can be used) for the duration of the COVID-19 declaration under Section 564(b)(1) of the Act, 21 U.S.C. section 360bbb-3(b)(1), unless the authorization is terminated or revoked sooner.  Performed at [facility], [address] [place], [address] [place]   MRSA PCR Screening     Status: None   Collection Time: 02/10/20 10:03 AM   Specimen: Nasopharyngeal  Result Value Ref Range Status   MRSA by PCR NEGATIVE NEGATIVE Final    Comment:        The GeneXpert MRSA Assay (FDA approved for NASAL specimens only), is one component of a comprehensive MRSA colonization surveillance program. It is not intended to diagnose MRSA infection nor to guide or monitor treatment for MRSA infections. Performed at [facility], [address] [place], [address] [place]    Studies/Results: No results found. Medications: I have reviewed the patient's  current medications. Scheduled Meds: . alum & mag hydroxide-simeth  30 mL Oral Once   And  . lidocaine  15 mL Oral Once  . chlordiazePOXIDE  25 mg Oral TID  . Chlorhexidine Gluconate Cloth  6 each Topical Daily  . dicyclomine  10 mg Oral Once  . enoxaparin (LOVENOX) injection  40 mg Subcutaneous Q24H  . [START ON 02/12/2020] folic acid  1 mg Oral Daily  .  lactulose  30 g Oral BID  . multivitamin with minerals  1 tablet Oral Daily  . nicotine  21 mg Transdermal Daily  . pantoprazole  40 mg Intravenous Q12H  . traZODone  25-50 mg Oral QHS   Continuous Infusions: . sodium chloride 100 mL/hr at 02/11/20 1000  . dexmedetomidine (PRECEDEX) IV infusion 0.8 mcg/kg/hr (02/11/20 1110)  . thiamine injection Stopped (02/10/20 2128)   PRN Meds:.acetaminophen, alum & mag hydroxide-simeth, hydrALAZINE, HYDROcodone-acetaminophen, labetalol, lidocaine, ondansetron (ZOFRAN) IV, oxyCODONE-acetaminophen **AND** oxyCODONE, phenol   Assessment: Principal Problem:   Acute alcoholic hepatitis Active Problems:   Severe anxiety with panic   Esophagitis   Alcohol dependence with uncomplicated withdrawal (HCC)   Gastritis    Plan: This patient was admitted with alcoholic hepatitis which is unusual to see the liver enzymes this high and alcoholic hepatitis.  There may have been an acute on chronic illness as of yet undetermined what.  The important thing is that the patient's liver enzymes are improving.  The blood work showed a past infection with Epstein-Barr virus but no sign of active infection.  Recommend continue supportive care and monitoring the LFTs.   LOS: 4 days   Zachary Mendoza 02/11/2020, 12:44 PM Pager [phone] 7am-5pm  Check AMION for 5pm -7am coverage and on weekends

## 2020-02-11 NOTE — Progress Notes (Signed)
PROGRESS NOTE    Zachary L Mathey Jr.   EHM:[id number]  DOB: 01/23/1991  [facility]    DOA: 02/07/2020 LOS: 4   Brief Narrative   Zachary Mendoza  is a 29 y.o. Caucasian male with a known history of hypertension and panic attacks as well as alcohol and tobacco abuse and elevated LFTs, who presented to the ED on 02/07/20 with burning chest pain with associated nausea and vomiting occasional coffee-ground emesis, melena without bright red bleeding per rectum.  Reported drinking about 1 pint liquor daily, 2 pints that day, and expressed interest in detox.  ED Course: HR 128 with otherwise normal vitals.  Initial labs notable for AST 2070, ALT 1382 (both up from 64 and 92 respectively a month ago), INR 1.5, other LFT's within normal.  EtOH level was 356.  CBC was unremarkable except for hemoconcentration.   Right upper quadrant ultrasound showed fatty liver.   Treated with GI cocktail, Bentyl and IV fluids in the ED. Admitted to hospitalist service with GI consulted for further evaluation and management.  8/29: Withdrawal escalated despite Ativan and Librium. Transferred to ICU for Precedex drip.     Assessment & Plan   Principal Problem:   Acute alcoholic hepatitis Active Problems:   Alcohol dependence with uncomplicated withdrawal (HCC)   Gastritis   Esophagitis   Severe anxiety with panic   Acute alcoholic hepatitis - present on admission.  Pt drinks a pint of vodka daily, has for years but did have about 4 months sobriety he says earlier this year.  Evaluation showed Hepatitis A/B/C negative, past EBV infection but not active.  CMV negative RUQ U/S showed fatty liver without evidence of cirrhosis at this point.   AST on admission 2070 >> 8070 >> 1823>>406. ALT on admission 1383 >> 4438 >> 2269>>1182. --GI consulted --No indication for steroids, discriminant function < 32 --Complete alcohol cessation --Avoid hepatoxins --CMP's daily to monitor, expect  improvement  Alcohol dependence with uncomplicated withdrawal - drinks about a pint or more of vodka daily, says he binge drinks.   No history of DT's or withdrawal seizures.  Has had some hallucinations in the past during detox. --Continue with Precedex gtt in ICU --d/c IV fluids --Folic acid and thiamine --Discussed naltrexone with patient and he is agreeable.   Acute hepatitis needs to resolve before this can be started (when aminotransferase levels not greater than 5 times the upper limit of normal).   Would prescribe 50 mg natrexone PO daily on discharge with a few refills until he can see psychiatry outpatient --Of note, patient is uninsured.  Follow up for continuing this may be challenging and/or cost prohibitive.  Gastritis and Esophagitis - chronic, seen on prior EGD in April 2021.  Secondary to daily EtOH most likely.   Reported hematemesis and melena at home prior to admission. --IV Protonix BID --GI following  Severe anxiety with panic - Was prescribed PRN Vistaril on discharge in April, hold off for now while getting benzo's for withdrawal.  Used to be on Lexapro but this is not on his med history this admission.    Hypertension -  Likely due to anxiety and EtOH withdrawal.  PRN labetalol.  Once past withdrawal, will evaluate if need for chronic therapy on discharge.    Tobacco abuse - counseled on importance of smoking cessation.  Obesity: Body mass index is 39.67 kg/m.  Complicates overall care and prognosis.  Counseled on importance of diet, exercise for weight loss and  overall health.    DVT prophylaxis: enoxaparin (LOVENOX) injection 40 mg Start: 02/07/20 2200   Diet:  Diet Orders (From admission, onward)    Start     Ordered   02/08/20 1436  Diet Heart Room service appropriate? Yes; Fluid consistency: Thin  Diet effective now       Question Answer Comment  Room service appropriate? Yes   Fluid consistency: Thin      02/08/20 1436            Code Status:  Full Code    Subjective 02/11/20    Patient seen this AM in ICU, attempting to eat breakfast but did not have a bedside table, asked for one.  He asks what we're going, what the plan is.  Explained and he expressed understanding.  He says "no more alcohol, right?"   He says he feels tired but has not other complaints.    Disposition Plan & Communication   Status is: Inpatient  Remains inpatient appropriate because:IV treatments appropriate due to intensity of illness or inability to take PO.  Ongoing management of alcohol withdrawal.   Dispo: The patient is from: Home              Anticipated d/c is to: Home              Anticipated d/c date is: 2 days              Patient currently is not medically stable to d/c.   Family Communication: 8/29 Spoke to patient's sister who was visiting.  Had a long discussion about his course with alcoholism, addiction on both sides of his family, lost their mother to opioid addiction and patient and she were very close.  We discussed medication to help with sobriety, but importance of addressing the underlying anxiety/panic disorder as well.  She says he had been going to AA and doing well last time sober for several months, it was helpful, she hopes he will return.  Difficulty will be his social circle, his friends all drink and give him alcohol even when family try to keep him from it.   Consults, Procedures, Significant Events   Consultants:   GI  Procedures:   None  Antimicrobials:   None    Objective   Vitals:   02/11/20 0400 02/11/20 0500 02/11/20 0600 02/11/20 0700  BP: 123/84 122/80 125/83 125/80  Pulse: 88 82 78   Resp: 16 19  18   Temp: 98.6 F (37 C)     TempSrc: Oral     SpO2: 95% 97%    Weight:      Height:        Intake/Output Summary (Last 24 hours) at 02/11/2020 0826 Last data filed at 02/11/2020 0511 Gross per 24 hour  Intake --  Output 5450 ml  Net -5450 ml   Filed Weights   02/07/20 1829 02/10/20 0405   Weight: 106.6 kg 114.9 kg    Physical Exam:  General exam: drowsy but awake, no acute distress Respiratory: CTAB, normal respiratory effort Cardiovascular: RRR, normal S1/S2, no edema Central nervous system: oriented to self, place, situation, not date, no gross focal neurologic deficits, normal speech Psychiatry: normal mood, congruent affect  Labs   Data Reviewed: I have personally reviewed following labs and imaging studies  CBC: Recent Labs  Lab 02/07/20 1831 02/08/20 0114 02/08/20 0712 02/08/20 1101 02/08/20 1713  WBC 7.5  --   --   --   --  HGB 17.2* 17.2* 16.5 16.7 15.1  HCT 49.8 48.5 49.5 50.4 44.9  MCV 83.3  --   --   --   --   PLT 325  --   --   --   --    Basic Metabolic Panel: Recent Labs  Lab 02/07/20 1831 02/08/20 0712 02/08/20 1101 02/09/20 1429 02/10/20 0607 02/11/20 0430  NA 140  --  137 134* 135 139  K 3.7  --  4.5 3.8 3.3* 4.0  CL 101  --  101 104 103 107  CO2 24  --  21* 23 22 25   GLUCOSE 216*  --  114* 142* 110* 99  BUN 24*  --  26* 20 14 12   CREATININE 0.97  --  0.90 1.02 0.78 0.83  CALCIUM 8.6*  --  7.6* 8.1* 7.9* 8.5*  MG  --   --  2.5*  --  2.1  --   PHOS  --  4.5  --   --   --   --    GFR: Estimated Creatinine Clearance: 156.1 mL/min (by C-G formula based on SCr of 0.83 mg/dL). Liver Function Tests: Recent Labs  Lab 02/07/20 1831 02/08/20 1101 02/09/20 1429 02/10/20 0607 02/11/20 0430  AST 2,070* 8,070* 1,823* 877* 406*  ALT 1,383* 4,438* 2,269* 1,624* 1,182*  ALKPHOS 80 81 87 86 83  BILITOT 1.0 1.7* 1.7* 1.3* 1.5*  PROT 7.7 6.8 5.7* 6.0* 6.6  ALBUMIN 4.4 3.9 3.2* 3.3* 3.6   Recent Labs  Lab 02/08/20 1101  LIPASE 56*   No results for input(s): AMMONIA in the last 168 hours. Coagulation Profile: Recent Labs  Lab 02/07/20 2128 02/10/20 0607 02/11/20 0430  INR 1.5* 1.0 0.9   Cardiac Enzymes: No results for input(s): CKTOTAL, CKMB, CKMBINDEX, TROPONINI in the last 168 hours. BNP (last 3 results) No results for  input(s): PROBNP in the last 8760 hours. HbA1C: No results for input(s): HGBA1C in the last 72 hours. CBG: Recent Labs  Lab 02/10/20 0957  GLUCAP 126*   Lipid Profile: No results for input(s): CHOL, HDL, LDLCALC, TRIG, CHOLHDL, LDLDIRECT in the last 72 hours. Thyroid Function Tests: No results for input(s): TSH, T4TOTAL, FREET4, T3FREE, THYROIDAB in the last 72 hours. Anemia Panel: No results for input(s): VITAMINB12, FOLATE, FERRITIN, TIBC, IRON, RETICCTPCT in the last 72 hours. Sepsis Labs: No results for input(s): PROCALCITON, LATICACIDVEN in the last 168 hours.  Recent Results (from the past 240 hour(s))  SARS Coronavirus 2 by RT PCR (hospital order, performed in [facility] hospital lab) Nasopharyngeal Nasopharyngeal Swab     Status: None   Collection Time: 02/07/20  7:44 PM   Specimen: Nasopharyngeal Swab  Result Value Ref Range Status   SARS Coronavirus 2 NEGATIVE NEGATIVE Final    Comment: (NOTE) SARS-CoV-2 target nucleic acids are NOT DETECTED.  The SARS-CoV-2 RNA is generally detectable in upper and lower respiratory specimens during the acute phase of infection. The lowest concentration of SARS-CoV-2 viral copies this assay can detect is 250 copies / mL. A negative result does not preclude SARS-CoV-2 infection and should not be used as the sole basis for treatment or other patient management decisions.  A negative result may occur with improper specimen collection / handling, submission of specimen other than nasopharyngeal swab, presence of viral mutation(s) within the areas targeted by this assay, and inadequate number of viral copies (<250 copies / mL). A negative result must be combined with clinical observations, patient history, and epidemiological information.  Fact Sheet  for Patients:   [employer]cyhttps://www.fda.gov/media/136312/download  Fact Sheet for Healthcare Providers: https://pope.com/https://www.fda.gov/media/136313/download  This test is not yet approved or  cleared by  the [place] FDA and has been authorized for detection and/or diagnosis of SARS-CoV-2 by FDA under an Emergency Use Authorization (EUA).  This EUA will remain in effect (meaning this test can be used) for the duration of the COVID-19 declaration under Section 564(b)(1) of the Act, 21 U.S.C. section 360bbb-3(b)(1), unless the authorization is terminated or revoked sooner.  Performed at [facility], [address] [place], [place] [address]   MRSA PCR Screening     Status: None   Collection Time: 02/10/20 10:03 AM   Specimen: Nasopharyngeal  Result Value Ref Range Status   MRSA by PCR NEGATIVE NEGATIVE Final    Comment:        The GeneXpert MRSA Assay (FDA approved for NASAL specimens only), is one component of a comprehensive MRSA colonization surveillance program. It is not intended to diagnose MRSA infection nor to guide or monitor treatment for MRSA infections. Performed at [facility], [address] [place], [place] [address]       Imaging Studies   No results found.   Medications   Scheduled Meds: . alum & mag hydroxide-simeth  30 mL Oral Once   And  . lidocaine  15 mL Oral Once  . chlordiazePOXIDE  25 mg Oral TID  . Chlorhexidine Gluconate Cloth  6 each Topical Daily  . dicyclomine  10 mg Oral Once  . enoxaparin (LOVENOX) injection  40 mg Subcutaneous Q24H  . lactulose  30 g Oral BID  . multivitamin with minerals  1 tablet Oral Daily  . nicotine  21 mg Transdermal Daily  . pantoprazole  40 mg Intravenous Q12H  . traZODone  25-50 mg Oral QHS   Continuous Infusions: . sodium chloride 100 mL/hr at 02/11/20 0627  . dexmedetomidine (PRECEDEX) IV infusion 1.2 mcg/kg/hr (02/11/20 0511)  . thiamine injection 500 mg (02/10/20 2058)       LOS: 4 days    Time spent: 25 minutes with > 50% spent in coordination of care and direct patient contact    Pennie BanterKelly A Ashantee Deupree, DO [facility]  02/11/2020, 8:26 AM    If 7PM-7AM,  please contact night-coverage. How to contact the [facility] Attending or Consulting provider 7A - 7P or covering provider during after hours 7P -7A, for this patient?    1. Check the care team in [facility] and look for a) attending/consulting [facility] provider listed and b) the [facility] team listed 2. Log into [email or web address] and use [facility]'s universal password to access. If you do not have the password, please contact the hospital operator. 3. Locate the [facility] provider you are looking for under [facility] and page to a number that you can be directly reached. 4. If you still have difficulty reaching the provider, please page the [facility] (Director on Call) for the Hospitalists listed on amion for assistance.

## 2020-02-12 LAB — COMPREHENSIVE METABOLIC PANEL
ALT: 745 U/L — ABNORMAL HIGH (ref 0–44)
AST: 161 U/L — ABNORMAL HIGH (ref 15–41)
Albumin: 3.5 g/dL (ref 3.5–5.0)
Alkaline Phosphatase: 72 U/L (ref 38–126)
Anion gap: 7 (ref 5–15)
BUN: 16 mg/dL (ref 6–20)
CO2: 22 mmol/L (ref 22–32)
Calcium: 8.7 mg/dL — ABNORMAL LOW (ref 8.9–10.3)
Chloride: 106 mmol/L (ref 98–111)
Creatinine, Ser: 0.82 mg/dL (ref 0.61–1.24)
GFR calc Af Amer: >60 mL/min (ref >60)
GFR calc non Af Amer: >60 mL/min (ref >60)
Glucose, Bld: 110 mg/dL — ABNORMAL HIGH (ref 70–99)
Potassium: 4.1 mmol/L (ref 3.5–5.1)
Sodium: 135 mmol/L (ref 135–145)
Total Bilirubin: 1.2 mg/dL (ref 0.3–1.2)
Total Protein: 6.8 g/dL (ref 6.5–8.1)

## 2020-02-12 LAB — HSV(HERPES SIMPLEX VRS) I + II AB-IGM: HSVI/II Comb IgM: <0.91 Ratio (ref 0.00–0.90)

## 2020-02-12 LAB — PROTIME-INR
INR: 1 (ref 0.8–1.2)
Prothrombin Time: 12.6 seconds (ref 11.4–15.2)

## 2020-02-12 MED ORDER — FLUOXETINE HCL 20 MG PO CAPS
20.0000 mg | ORAL_CAPSULE | Freq: Every day | ORAL | Status: DC
  Administered 2020-02-12: 20 mg via ORAL
  Filled 2020-02-12 (×2): qty 1

## 2020-02-12 MED ORDER — LORAZEPAM 1 MG PO TABS: 1.0000 mg | ORAL_TABLET | ORAL | Status: DC | PRN

## 2020-02-12 MED ORDER — OXYCODONE HCL 5 MG PO TABS
10.0000 mg | ORAL_TABLET | Freq: Four times a day (QID) | ORAL | Status: DC | PRN
  Administered 2020-02-12 – 2020-02-13 (×4): 10 mg via ORAL
  Filled 2020-02-12 (×4): qty 2

## 2020-02-12 MED ORDER — PANTOPRAZOLE SODIUM 40 MG PO TBEC
40.0000 mg | DELAYED_RELEASE_TABLET | Freq: Two times a day (BID) | ORAL | Status: DC
  Administered 2020-02-12 – 2020-02-13 (×2): 40 mg via ORAL
  Filled 2020-02-12 (×2): qty 1

## 2020-02-12 MED ORDER — CHLORDIAZEPOXIDE HCL 25 MG PO CAPS
25.0000 mg | ORAL_CAPSULE | Freq: Three times a day (TID) | ORAL | Status: DC
  Administered 2020-02-12: 25 mg via ORAL

## 2020-02-12 MED ORDER — LORAZEPAM 2 MG/ML IJ SOLN
1.0000 mg | INTRAMUSCULAR | Status: DC | PRN
  Administered 2020-02-12: 1 mg via INTRAVENOUS
  Filled 2020-02-12: qty 1

## 2020-02-12 MED ORDER — ALPRAZOLAM 0.5 MG PO TABS
1.0000 mg | ORAL_TABLET | Freq: Once | ORAL | Status: AC
  Administered 2020-02-12: 1 mg via ORAL
  Filled 2020-02-12: qty 2

## 2020-02-12 MED ORDER — OXYCODONE HCL 5 MG PO TABS: 5.0000 mg | ORAL_TABLET | Freq: Four times a day (QID) | ORAL | Status: DC | PRN

## 2020-02-12 MED ORDER — HYDROMORPHONE HCL 1 MG/ML IJ SOLN
1.0000 mg | INTRAMUSCULAR | Status: DC | PRN
  Administered 2020-02-12 (×2): 1 mg via INTRAVENOUS
  Filled 2020-02-12 (×2): qty 1

## 2020-02-12 MED ORDER — MORPHINE SULFATE (PF) 2 MG/ML IV SOLN
2.0000 mg | INTRAVENOUS | Status: DC | PRN
  Administered 2020-02-12: 2 mg via INTRAVENOUS
  Filled 2020-02-12: qty 1

## 2020-02-12 MED ORDER — GABAPENTIN 600 MG PO TABS
300.0000 mg | ORAL_TABLET | Freq: Three times a day (TID) | ORAL | Status: DC
  Administered 2020-02-12 – 2020-02-13 (×3): 300 mg via ORAL
  Filled 2020-02-12 (×3): qty 1

## 2020-02-12 MED ORDER — HYDROMORPHONE HCL 1 MG/ML IJ SOLN
1.0000 mg | INTRAMUSCULAR | Status: DC | PRN
  Administered 2020-02-12 (×3): 1 mg via INTRAVENOUS
  Filled 2020-02-12 (×3): qty 1

## 2020-02-12 NOTE — Progress Notes (Signed)
Zachary Minium, MD [facility]   [address], [address] [place], [place] [address] Phone: [phone] Fax : [phone]   Subjective: The patient is walk around the room and using the commode when I came to see him today.  The patient is completely lucid which is a big change from yesterday.  He is answering questions appropriately.  His liver enzymes have come down dramatically.   Objective: Vital signs in last 24 hours: Vitals:   02/12/20 0900 02/12/20 1000 02/12/20 1100 02/12/20 1307  BP: 123/76 137/83  134/88  Pulse: (!) 57  98 (!) 117  Resp: 13 (!) 21  (!) 24  Temp:    99.3 F (37.4 C)  TempSrc:    Oral  SpO2: (!) 71%  95% 99%  Weight:      Height:       Weight change:   Intake/Output Summary (Last 24 hours) at 02/12/2020 1448 Last data filed at 02/12/2020 5176 Gross per 24 hour  Intake 1154.82 ml  Output 2300 ml  Net -1145.18 ml     Exam:  Gen: Patient sitting on the commode in no apparent distress   Lab Results: @LABTEST2 @ Micro Results: Recent Results (from the past 240 hour(s))  SARS Coronavirus 2 by RT PCR ([facility] order, performed in [facility] lab) Nasopharyngeal Nasopharyngeal Swab     Status: None   Collection Time: 02/07/20  7:44 PM   Specimen: Nasopharyngeal Swab  Result Value Ref Range Status   SARS Coronavirus 2 NEGATIVE NEGATIVE Final    Comment: (NOTE) SARS-CoV-2 target nucleic acids are NOT DETECTED.  The SARS-CoV-2 RNA is generally detectable in upper and lower respiratory specimens during the acute phase of infection. The lowest concentration of SARS-CoV-2 viral copies this assay can detect is 250 copies / mL. A negative result does not preclude SARS-CoV-2 infection and should not be used as the sole basis for treatment or other patient management decisions.  A negative result may occur with improper specimen collection / handling, submission of specimen other than nasopharyngeal swab, presence of viral mutation(s) within the areas  targeted by this assay, and inadequate number of viral copies (<250 copies / mL). A negative result must be combined with clinical observations, patient history, and epidemiological information.  Fact Sheet for Patients:   02/09/20  Fact Sheet for Healthcare Providers: BoilerBrush.com.cy  This test is not yet approved or  cleared by the https://pope.com/ FDA and has been authorized for detection and/or diagnosis of SARS-CoV-2 by FDA under an Emergency Use Authorization (EUA).  This EUA will remain in effect (meaning this test can be used) for the duration of the COVID-19 declaration under Section 564(b)(1) of the Act, 21 U.S.C. section 360bbb-3(b)(1), unless the authorization is terminated or revoked sooner.  Performed at [facility], [address] [place], [place]   MRSA PCR Screening     Status: None   Collection Time: 02/10/20 10:03 AM   Specimen: Nasopharyngeal  Result Value Ref Range Status   MRSA by PCR NEGATIVE NEGATIVE Final    Comment:        The GeneXpert MRSA Assay (FDA approved for NASAL specimens only), is one component of a comprehensive MRSA colonization surveillance program. It is not intended to diagnose MRSA infection nor to guide or monitor treatment for MRSA infections. Performed at [facility], [address] [place], [place]    Studies/Results: No results found. Medications: I have reviewed the patient's current medications. Scheduled Meds: . chlordiazePOXIDE  25 mg Oral TID  . Chlorhexidine Gluconate Cloth  6 each Topical Daily  . enoxaparin (LOVENOX) injection  40 mg Subcutaneous Q24H  . FLUoxetine  20 mg Oral QHS  . folic acid  1 mg Oral Daily  . gabapentin  300 mg Oral TID  . multivitamin with minerals  1 tablet Oral Daily  . nicotine  21 mg Transdermal Daily  . pantoprazole  40 mg Oral BID AC  . traZODone  25-50 mg Oral QHS   Continuous  Infusions: . thiamine injection 500 mg (02/11/20 2155)   PRN Meds:.acetaminophen, alum & mag hydroxide-simeth, hydrALAZINE, HYDROmorphone (DILAUDID) injection, labetalol, lidocaine, LORazepam **OR** LORazepam, ondansetron (ZOFRAN) IV, oxyCODONE, oxyCODONE, phenol   Assessment: Principal Problem:   Acute alcoholic hepatitis Active Problems:   Severe anxiety with panic   Esophagitis   Alcohol dependence with uncomplicated withdrawal (HCC)   Gastritis    Plan: The patient's liver enzymes have continued to come down.  The patient likely had acute on chronic hepatitis.  The patient's liver enzymes were too high to be just alcoholic hepatitis.  Despite no cause found for his increased liver enzymes he is improving and there is nothing further to do from a GI point of view.  The patient has been told to continue abstinence from alcohol.  The patient should follow-up as an outpatient with Dr. Hoy Finlay.  I will sign off.  Please call if any further GI concerns or questions.  We would like to thank you for the opportunity to participate in the care of Zachary L Malanga Jr..     LOS: 5 days   [name] 02/12/2020, 2:48 PM Pager [phone] 7am-5pm  Check AMION for 5pm -7am coverage and on weekends

## 2020-02-12 NOTE — Progress Notes (Addendum)
PROGRESS NOTE    Zachary L Teel Jr.   ZES:[id number]  DOB: 04/10/1991  PCP: [facility]    DOA: 02/07/2020 LOS: 5   Brief Narrative   Zachary [facility]  is a 29 y.o. Caucasian male with a known history of hypertension and panic attacks as well as alcohol and tobacco abuse and elevated LFTs, who presented to the ED on 02/07/20 with burning chest pain with associated nausea and vomiting occasional coffee-ground emesis, melena without bright red bleeding per rectum.  Reported drinking about 1 pint liquor daily, 2 pints that day, and expressed interest in detox.  ED Course: HR 128 with otherwise normal vitals.  Initial labs notable for AST 2070, ALT 1382 (both up from 64 and 92 respectively a month ago), INR 1.5, other LFT's within normal.  EtOH level was 356.  CBC was unremarkable except for hemoconcentration.   Right upper quadrant ultrasound showed fatty liver.   Treated with GI cocktail, Bentyl and IV fluids in the ED. Admitted to hospitalist service with GI consulted for further evaluation and management.  8/29: Withdrawal escalated despite Ativan and Librium. Transferred to ICU for Precedex drip. 8/31: Off Precedex around 730am.  Transferred out to med/surg.  Continues to require PRN Ativan, on scheduled Librium   Assessment & Plan   Principal Problem:   Acute alcoholic hepatitis Active Problems:   Alcohol dependence with uncomplicated withdrawal (HCC)   Gastritis   Esophagitis   Severe anxiety with panic   Acute alcoholic hepatitis - present on admission.  LFT's improving.   Pt drinks a pint of vodka daily, has for years but did have about 4 months sobriety he says earlier this year.   Evaluation showed Hepatitis A/B/C negative, past EBV infection but not active.  CMV negative. RUQ U/S showed fatty liver without evidence of cirrhosis at this point.   AST on admission 2070 >> 8070 >> 1823>>406>>141. ALT on admission 1383 >> 4438 >> 2269>>1182>>745. --GI  consulted --No indication for steroids, discriminant function < 32 --Complete alcohol cessation --Avoid hepatoxins --CMP's daily to monitor, expect improvement  Alcohol dependence with uncomplicated withdrawal - drinks about a pint or more of vodka daily, says he binge drinks, uses alcohol to self-medicate for anxiety/panic.   No history of withdrawal seizures.  Does get DT's with hallucinations.   Required Precedex gtt in ICU for 2 days this admission. --d/c Precedex --resume PRN Ativan per CIWA protocol  --start gabapentin 300 mg TID (titrate up as tolerated)   Plan to d/c on gabapentin for relapse prevention (liver needs further recovery before naltrexone can be started). --Folic acid and thiamine --Discussed naltrexone with patient and he is very agreeable.   Acute hepatitis needs to resolve before this can be started (when aminotransferase levels not greater than 5 times the upper limit of normal).   Provided him with literature on Vivitrol and local prescribers. --Needs to establish with psychiatry for his severe anxiety and addiction management --Of note, patient is uninsured.  Follow up may be cost prohibitive.  Mouth Pain / Poor Dentition - patient was to see a dentist for left sided tooth pain, but ended up admitted here.  He has rescheduled his appointment for next week.  PRN oxycodone 5-10mg  ordered.  Diligent oral hygiene.  Gastritis and Esophagitis - chronic, seen on prior EGD in April 2021.  Secondary to daily EtOH most likely.   Reported hematemesis and melena at home prior to admission which resolved. --Oral Protonix BID, d/c IV --GI following  as above  Severe anxiety with panic - Was prescribed PRN Vistaril on discharge in April, hold off for now while getting benzo's for withdrawal.  Used to be on Lexapro but stopped it, says unhelpful (unclear how long he took it).   --Started on Prozac qHS --Needs outpatient psychiatry anxiety/panic disorder and EtOH/Addiction.  TTS  consult has been placed.  Hypertension -  Likely due to anxiety and EtOH withdrawal.  PRN labetalol.  Once past withdrawal, will evaluate if need for chronic therapy on discharge.    Tobacco abuse - counseled on importance of smoking cessation.  Obesity: Body mass index is 39.67 kg/m.  Complicates overall care and prognosis.  Counseled on importance of diet, exercise for weight loss and overall health.    DVT prophylaxis: enoxaparin (LOVENOX) injection 40 mg Start: 02/07/20 2200   Diet:  Diet Orders (From admission, onward)    Start     Ordered   02/08/20 1436  Diet Heart Room service appropriate? Yes; Fluid consistency: Thin  Diet effective now       Question Answer Comment  Room service appropriate? Yes   Fluid consistency: Thin      02/08/20 1436            Code Status: Full Code    Subjective 02/12/20    Patient seen this AM in ICU.  He is off Precedex, appears drowsy but awake and conversational.  Reports severe left lower dental pain.  He missed an appointment with dentist for this due to this admission.  Says he rescheduled to be seen next week. He denies any fever/chills, abdominal pain, N/V/D or other complaints.   Says he was having significant hallucinations a day or two ago, but those have resolved.  He continues to say he is done drinking and wants to do everything he can to stay sober.    Disposition Plan & Communication   Status is: Inpatient  Remains inpatient appropriate because:IV treatments appropriate due to intensity of illness or inability to take PO.  Ongoing management of alcohol withdrawal requiring IV meds.   Dispo: The patient is from: Home              Anticipated d/c is to: Home              Anticipated d/c date is: 1-2 days               Patient currently is not medically stable to d/c.   Family Communication: 8/29 Spoke to patient's sister who was visiting.  Had a long discussion about his course with alcoholism, addiction on both sides of  his family, lost their mother to opioid addiction and patient and she were very close.  We discussed medication to help with sobriety, but importance of addressing the underlying anxiety/panic disorder as well.  She says he had been going to AA and doing well last time sober for several months, it was helpful, she hopes he will return.  Difficulty will be his social circle, his friends all drink and give him alcohol even when family try to keep him from it.   Consults, Procedures, Significant Events   Consultants:   GI  Procedures:   None  Antimicrobials:   None    Objective   Vitals:   02/12/20 0900 02/12/20 1000 02/12/20 1100 02/12/20 1307  BP: 123/76 137/83  134/88  Pulse: (!) 57  98 (!) 117  Resp: 13 (!) 21  (!) 24  Temp:  99.3 F (37.4 C)  TempSrc:    Oral  SpO2: (!) 71%  95% 99%  Weight:      Height:        Intake/Output Summary (Last 24 hours) at 02/12/2020 1314 Last data filed at 02/12/2020 1914 Gross per 24 hour  Intake 1154.82 ml  Output 2300 ml  Net -1145.18 ml   Filed Weights   02/07/20 1829 02/10/20 0405  Weight: 106.6 kg 114.9 kg    Physical Exam:  General exam: awake, no acute distress, conversational ENT: left lower back molar with large appearing cavity, missing molars anterior to it, gums with erythema, unable to see any abscess or purulence Respiratory: CTAB, normal respiratory effort Cardiovascular: RRR, normal S1/S2, no edema Central nervous system: alert & oriented (except time), no gross focal neurologic deficits, normal speech Psychiatry: normal mood, congruent affect  Labs   Data Reviewed: I have personally reviewed following labs and imaging studies  CBC: Recent Labs  Lab 02/07/20 1831 02/08/20 0114 02/08/20 0712 02/08/20 1101 02/08/20 1713  WBC 7.5  --   --   --   --   HGB 17.2* 17.2* 16.5 16.7 15.1  HCT 49.8 48.5 49.5 50.4 44.9  MCV 83.3  --   --   --   --   PLT 325  --   --   --   --    Basic Metabolic Panel: Recent  Labs  Lab 02/07/20 1831 02/08/20 0712 02/08/20 1101 02/09/20 1429 02/10/20 0607 02/11/20 0430 02/12/20 0559  NA   < >  --  137 134* 135 139 135  K   < >  --  4.5 3.8 3.3* 4.0 4.1  CL   < >  --  101 104 103 107 106  CO2   < >  --  21* GLUCOSE   < >  --  114* 142* 110* 99 110*  BUN   < >  --  26* CREATININE   < >  --  0.90 1.02 0.78 0.83 0.82  CALCIUM   < >  --  7.6* 8.1* 7.9* 8.5* 8.7*  MG  --   --  2.5*  --  2.1  --   --   PHOS  --  4.5  --   --   --   --   --    < > = values in this interval not displayed.   GFR: Estimated Creatinine Clearance: 158 mL/min (by C-G formula based on SCr of 0.82 mg/dL). Liver Function Tests: Recent Labs  Lab 02/08/20 1101 02/09/20 1429 02/10/20 0607 02/11/20 0430 02/12/20 0559  AST 8,070* 1,823* 877* 406* 161*  ALT 4,438* 2,269* 1,624* 1,182* 745*  ALKPHOS 81 87 86 83 72  BILITOT 1.7* 1.7* 1.3* 1.5* 1.2  PROT 6.8 5.7* 6.0* 6.6 6.8  ALBUMIN 3.9 3.2* 3.3* 3.6 3.5   Recent Labs  Lab 02/08/20 1101  LIPASE 56*   No results for input(s): AMMONIA in the last 168 hours. Coagulation Profile: Recent Labs  Lab 02/07/20 2128 02/10/20 0607 02/11/20 0430 02/12/20 0559  INR 1.5* 1.0 0.9 1.0   Cardiac Enzymes: No results for input(s): CKTOTAL, CKMB, CKMBINDEX, TROPONINI in the last 168 hours. BNP (last 3 results) No results for input(s): PROBNP in the last 8760 hours. HbA1C: No results for input(s): HGBA1C in the last 72 hours. CBG: Recent Labs  Lab 02/10/20 0957  GLUCAP 126*   Lipid Profile: No results  for input(s): CHOL, HDL, LDLCALC, TRIG, CHOLHDL, LDLDIRECT in the last 72 hours. Thyroid Function Tests: No results for input(s): TSH, T4TOTAL, FREET4, T3FREE, THYROIDAB in the last 72 hours. Anemia Panel: No results for input(s): VITAMINB12, FOLATE, FERRITIN, TIBC, IRON, RETICCTPCT in the last 72 hours. Sepsis Labs: No results for input(s): PROCALCITON, LATICACIDVEN in the last 168 hours.  Recent  Results (from the past 240 hour(s))  SARS Coronavirus 2 by RT PCR (hospital order, performed in [facility] hospital lab) Nasopharyngeal Nasopharyngeal Swab     Status: None   Collection Time: 02/07/20  7:44 PM   Specimen: Nasopharyngeal Swab  Result Value Ref Range Status   SARS Coronavirus 2 NEGATIVE NEGATIVE Final    Comment: (NOTE) SARS-CoV-2 target nucleic acids are NOT DETECTED.  The SARS-CoV-2 RNA is generally detectable in upper and lower respiratory specimens during the acute phase of infection. The lowest concentration of SARS-CoV-2 viral copies this assay can detect is 250 copies / mL. A negative result does not preclude SARS-CoV-2 infection and should not be used as the sole basis for treatment or other patient management decisions.  A negative result may occur with improper specimen collection / handling, submission of specimen other than nasopharyngeal swab, presence of viral mutation(s) within the areas targeted by this assay, and inadequate number of viral copies (<250 copies / mL). A negative result must be combined with clinical observations, patient history, and epidemiological information.  Fact Sheet for Patients:   BoilerBrush.com.cy  Fact Sheet for Healthcare Providers: https://pope.com/  This test is not yet approved or  cleared by the [place] FDA and has been authorized for detection and/or diagnosis of SARS-CoV-2 by FDA under an Emergency Use Authorization (EUA).  This EUA will remain in effect (meaning this test can be used) for the duration of the COVID-19 declaration under Section 564(b)(1) of the Act, 21 U.S.C. section 360bbb-3(b)(1), unless the authorization is terminated or revoked sooner.  Performed at [facility], [address] [place], [place] [address]   MRSA PCR Screening     Status: None   Collection Time: 02/10/20 10:03 AM   Specimen: Nasopharyngeal  Result Value Ref Range  Status   MRSA by PCR NEGATIVE NEGATIVE Final    Comment:        The GeneXpert MRSA Assay (FDA approved for NASAL specimens only), is one component of a comprehensive MRSA colonization surveillance program. It is not intended to diagnose MRSA infection nor to guide or monitor treatment for MRSA infections. Performed at [facility], [address] [place], [place] [address]       Imaging Studies   No results found.   Medications   Scheduled Meds: . chlordiazePOXIDE  25 mg Oral TID  . Chlorhexidine Gluconate Cloth  6 each Topical Daily  . enoxaparin (LOVENOX) injection  40 mg Subcutaneous Q24H  . folic acid  1 mg Oral Daily  . gabapentin  300 mg Oral TID  . multivitamin with minerals  1 tablet Oral Daily  . nicotine  21 mg Transdermal Daily  . pantoprazole  40 mg Intravenous Q12H  . traZODone  25-50 mg Oral QHS   Continuous Infusions: . thiamine injection 500 mg (02/11/20 2155)       LOS: 5 days    Time spent: 30 minutes with > 50% spent in coordination of care and direct patient contact    Pennie Banter, DO [facility]  02/12/2020, 1:14 PM    If 7PM-7AM, please contact night-coverage. How  to contact the [facility] Attending or Consulting provider 7A - 7P or covering provider during after hours 7P -7A, for this patient?    1. Check the care team in [facility] and look for a) attending/consulting [facility] provider listed and b) the [facility] team listed 2. Log into [email or web address] and use [facility]'s universal password to access. If you do not have the password, please contact the hospital operator. 3. Locate the [facility] provider you are looking for under [facility] and page to a number that you can be directly reached. 4. If you still have difficulty reaching the provider, please page the [facility] (Director on Call) for the Hospitalists listed on amion for assistance.

## 2020-02-12 NOTE — Progress Notes (Signed)
   02/12/20 1307  Assess: MEWS Score  Temp 99.3 F (37.4 C)  BP 134/88  Pulse Rate (!) 117  Resp (!) 24  SpO2 99 %  O2 Device Room Air  Assess: MEWS Score  MEWS Temp 0  MEWS Systolic 0  MEWS Pulse 2  MEWS RR 1  MEWS LOC 0  MEWS Score 3  MEWS Score Color Yellow  Assess: if the MEWS score is Yellow or Red  Were vital signs taken at a resting state? Yes  Focused Assessment Change from prior assessment (see assessment flowsheet)  Early Detection of Sepsis Score *See Row Information* Low  MEWS guidelines implemented *See Row Information* Yes  Treat  MEWS Interventions Administered prn meds/treatments  Pain Scale 0-10  Pain Score 8  Pain Type Acute pain  Pain Location Teeth  Pain Orientation Left;Lower  Pain Radiating Towards face  Pain Descriptors / Indicators Aching  Pain Frequency Constant  Pain Onset On-going  Patients Stated Pain Goal 2  Pain Intervention(s) Medication (See eMAR)  Multiple Pain Sites No  Facial Expression 0  Body Movements 0  Muscle Tension 0  Compliance with ventilator (intubated pts.) N/A  Vocalization (extubated pts.) N/A  CPOT Total 0  Complains of Anxiety  Neuro symptoms relieved by Rest  Patients response to intervention Relief  Take Vital Signs  Increase Vital Sign Frequency  Yellow: Q 2hr X 2 then Q 4hr X 2, if remains yellow, continue Q 4hrs  Escalate  MEWS: Escalate Yellow: discuss with charge nurse/RN and consider discussing with provider and RRT  Notify: Charge Nurse/RN  Name of Charge Nurse/RN Notified Olivia, RN  Date Charge Nurse/RN Notified 02/12/20  Time Charge Nurse/RN Notified 1310  Notify: Provider  Provider Name/Title Dr. Denton Lank  Date Provider Notified 02/12/20  Time Provider Notified 1315  Notification Type Page  Notification Reason Change in status  Response See new orders  Date of Provider Response 02/12/20  Time of Provider Response 1315  Document  Patient Outcome Other (Comment) (Continuing to monitor)   Progress note created (see row info) Yes

## 2020-02-12 NOTE — Progress Notes (Signed)
Notified Manuela Schwartz regarding patient complaints of a panic attack. Since patient is going home in the morning and not scoring on CIWA scale, I suggested a po anti-anxiety med. Orders were placed for xanex po. I checked on patient after notifying Steward Drone and he was sleeping. Will continue to monitor.  Arturo Morton

## 2020-02-12 NOTE — Plan of Care (Signed)
Continuing with plan of care. 

## 2020-02-12 NOTE — Progress Notes (Signed)
Pt transferred to RM #[id number] at this time. VSS prior to transfer. Pt still c/o tooth/jaw pain.

## 2020-02-13 LAB — COMPREHENSIVE METABOLIC PANEL
ALT: 539 U/L — ABNORMAL HIGH (ref 0–44)
AST: 106 U/L — ABNORMAL HIGH (ref 15–41)
Albumin: 3.8 g/dL (ref 3.5–5.0)
Alkaline Phosphatase: 71 U/L (ref 38–126)
Anion gap: 8 (ref 5–15)
BUN: 21 mg/dL — ABNORMAL HIGH (ref 6–20)
CO2: 25 mmol/L (ref 22–32)
Calcium: 8.7 mg/dL — ABNORMAL LOW (ref 8.9–10.3)
Chloride: 103 mmol/L (ref 98–111)
Creatinine, Ser: 0.95 mg/dL (ref 0.61–1.24)
GFR calc Af Amer: >60 mL/min (ref >60)
GFR calc non Af Amer: >60 mL/min (ref >60)
Glucose, Bld: 123 mg/dL — ABNORMAL HIGH (ref 70–99)
Potassium: 4 mmol/L (ref 3.5–5.1)
Sodium: 136 mmol/L (ref 135–145)
Total Bilirubin: 1.1 mg/dL (ref 0.3–1.2)
Total Protein: 6.8 g/dL (ref 6.5–8.1)

## 2020-02-13 LAB — PROTIME-INR
INR: 0.9 (ref 0.8–1.2)
Prothrombin Time: 12.1 seconds (ref 11.4–15.2)

## 2020-02-13 MED ORDER — PHENOL 1.4 % MT LIQD: 1.0000 | OROMUCOSAL | 0 refills | Status: DC | PRN

## 2020-02-13 MED ORDER — FLUOXETINE HCL 20 MG PO CAPS: 20.0000 mg | ORAL_CAPSULE | Freq: Every day | ORAL | 0 refills | Status: DC

## 2020-02-13 MED ORDER — GABAPENTIN 600 MG PO TABS: 300.0000 mg | ORAL_TABLET | Freq: Three times a day (TID) | ORAL | 0 refills | Status: DC

## 2020-02-13 MED ORDER — CHLORDIAZEPOXIDE HCL 25 MG PO CAPS: 25.0000 mg | ORAL_CAPSULE | Freq: Three times a day (TID) | ORAL | 0 refills | Status: AC

## 2020-02-13 MED ORDER — FOLIC ACID 1 MG PO TABS: 1.0000 mg | ORAL_TABLET | Freq: Every day | ORAL | 0 refills | Status: AC

## 2020-02-13 MED ORDER — ADULT MULTIVITAMIN W/MINERALS CH: 1.0000 | ORAL_TABLET | Freq: Every day | ORAL | 0 refills | Status: AC

## 2020-02-13 MED ORDER — OXYCODONE HCL 5 MG PO TABS
5.0000 mg | ORAL_TABLET | Freq: Four times a day (QID) | ORAL | 0 refills | Status: AC | PRN
Start: 2020-02-13 — End: 2020-02-18

## 2020-02-13 MED ORDER — LIDOCAINE VISCOUS HCL 2 % MT SOLN: 10.0000 mL | OROMUCOSAL | 0 refills | Status: AC | PRN

## 2020-02-13 MED ORDER — NICOTINE 21 MG/24HR TD PT24: 21.0000 mg | MEDICATED_PATCH | Freq: Every day | TRANSDERMAL | 0 refills | Status: DC

## 2020-02-13 MED ORDER — CLINDAMYCIN HCL 300 MG PO CAPS: 300.0000 mg | ORAL_CAPSULE | Freq: Four times a day (QID) | ORAL | 0 refills | Status: AC

## 2020-02-13 NOTE — Discharge Summary (Signed)
Physician Discharge Summary [facility]    Patient: Zachary AnchorsRonnie L Buechele Jr.                   Admit date: 02/07/2020   DOB: 10-10-1990             Discharge date:02/13/2020/11:30 AM ZOX:[id number]                          PCP: [place]errilyn [place], [facility]  Disposition: HOME   Recommendations for Outpatient Follow-up:   . Follow up: in 1 week  Discharge Condition: Stable   Code Status:   Code Status: Full Code  Diet recommendation: Regular healthy diet   Discharge Diagnoses:    Principal Problem:   Acute alcoholic hepatitis Active Problems:   Severe anxiety with panic   Esophagitis   Alcohol dependence with uncomplicated withdrawal (HCC)   Gastritis   History of Present Illness/ Hospital Course Zachary Mendoza/Brief Summary:   [name] [name], who presented to the ED on 02/07/20 with burning chest painwith associated nausea and vomiting occasional coffee-ground emesis, melena without bright red bleeding per rectum. Reported drinking about 1 pint liquor daily, 2 pints that day, and expressed interest in detox.  ED Course: HR 128 with otherwise normal vitals.  Initial labs notable for AST 2070, ALT 1382 (both up from 64 and 92 respectively a month ago), INR 1.5, other LFT's within normal.  EtOH level was 356.  CBC was unremarkable except for hemoconcentration.  Right upper quadrant ultrasound showed fatty liver.   Treated with GI cocktail, Bentyl and IV fluids in the ED. Admitted to hospitalist service with GI consulted for further evaluation and management.  8/29: Withdrawal escalated despite Ativan and Librium. Transferred to ICU for Precedex drip. 8/31: Off Precedex around 730am.  Transferred out to med/surg.  Continues to require PRN Ativan, on scheduled Librium  02/13/2020 -patient symptoms has improved ready for discharge, ask for few days of  pain medication, till he sees his dentist, he is recommended to see his dentist ASAP, continue his home medication of clindamycin ... Patient given a prescription for few more days of Librium... Vitamins, gabapentin, and OxyIR for 3 days  He will be discharged home today 02/13/2020 --------------------------------------------------------------------------------------------------------------------------------------   Acute alcoholic hepatitis - present on admission.  LFT's improving.   Pt drinks a pint of vodka daily, has for years but did have about 4 months sobriety he says earlier this year.   Evaluation showed Hepatitis A/B/C negative, past EBV infection but not active.  CMV negative. RUQ U/S showed fatty liver without evidence of cirrhosis at this point.   AST on admission 2070 >> 8070 >> 1823>>406>>141.>>> 106 ALT on admission 1383 >> 4438 >> 2269>>1182>>745 >> 539 --GI  was consulted and was following closely, LFTs improved --No indication for steroids, discriminant function < 32 --Complete alcohol cessation --Avoid hepatoxins --CMP's daily to monitor, with monitor continue to improve  Alcohol dependence with uncomplicated withdrawal - drinks about a pint or more of vodka daily, says he binge drinks, uses alcohol to self-medicate for anxiety/panic.   No history of withdrawal seizures.  Does get DT's with hallucinations.   Required Precedex gtt in ICU for 2 days this admission. --d/c Precedex --resume PRN Ativan per CIWA protocol  --start gabapentin 300 mg TID (titrate up as tolerated)   Plan to d/c on gabapentin  for relapse prevention (liver needs further recovery before naltrexone can be started). --Folic acid and thiamine --Discussed naltrexone with patient and he is very agreeable.   Acute hepatitis needs to resolve before this can be started (when aminotransferase levels not greater than 5 times the upper limit of normal).   Provided him with literature on Vivitrol and local  prescribers. --Needs to establish with psychiatry for his severe anxiety and addiction management --Of note, patient is uninsured.  Follow up may be cost prohibitive.  Mouth Pain / Poor Dentition - patient was to see a dentist for left sided tooth pain, but ended up admitted here.  He has rescheduled his appointment for next week.  PRN oxycodone 5-10mg  ordered.  Diligent oral hygiene.  Gastritis and Esophagitis - chronic, seen on prior EGD in April 2021.  Secondary to daily EtOH most likely.   Reported hematemesis and melena at home prior to admission which resolved. --Oral Protonix BID, d/c IV --GI following --signed off as LFTs improving  Severe anxiety with panic - Was prescribed PRN Vistaril on discharge in April, hold off for now while getting benzo's for withdrawal.  Used to be on Lexapro but stopped it, says unhelpful (unclear how long he took it).   --Started on Prozac qHS--to be continued --Needs outpatient psychiatry anxiety/panic disorder and EtOH/Addiction.  TTS consult provided resources for assist  Hypertension -  Likely due to anxiety and EtOH withdrawal.  PRN labetalol was used- improved    Tobacco abuse - counseled on importance of smoking cessation... Prescription for nicotine patch was provided  Obesity: Body mass index is 39.67 kg/m.  Complicates overall care and prognosis.  Counseled on importance of diet, exercise for weight loss and overall health.     Discharge Instructions:   Discharge Instructions    Activity as tolerated - No restrictions   Complete by: As directed    Call MD for:  difficulty breathing, headache or visual disturbances   Complete by: As directed    Call MD for:  redness, tenderness, or signs of infection (pain, swelling, redness, odor or green/yellow discharge around incision site)   Complete by: As directed    Call MD for:  temperature >100.4   Complete by: As directed    Diet - low sodium heart healthy   Complete by: As directed      Discharge instructions   Complete by: As directed    Continue current antibiotics clindamycin complete the course, continue mouthwash 3 times daily-4 times daily, follow-up with dentist ASAP.  Stay away from any alcohol or alcohol products.  Stay away from any Tylenol or Tylenol products.   Increase activity slowly   Complete by: As directed        Medication List    STOP taking these medications   doxycycline 100 MG tablet Commonly known as: VIBRA-TABS   HYDROcodone-acetaminophen 5-325 MG tablet Commonly known as: NORCO/VICODIN   oxyCODONE-acetaminophen 10-325 MG tablet Commonly known as: Percocet     TAKE these medications   chlordiazePOXIDE 25 MG capsule Commonly known as: LIBRIUM Take 1 capsule (25 mg total) by mouth 3 (three) times daily for 5 days. What changed:   medication strength  how much to take  how to take this  when to take this  additional instructions   clindamycin 300 MG capsule Commonly known as: CLEOCIN Take 1 capsule (300 mg total) by mouth every 6 (six) hours for 10 days. What changed: Another medication with the same name was removed. Continue  taking this medication, and follow the directions you see here.   FLUoxetine 20 MG capsule Commonly known as: PROZAC Take 1 capsule (20 mg total) by mouth at bedtime.   folic acid 1 MG tablet Commonly known as: FOLVITE Take 1 tablet (1 mg total) by mouth daily for 5 days. Start taking on: February 14, 2020   gabapentin 600 MG tablet Commonly known as: NEURONTIN Take 0.5 tablets (300 mg total) by mouth 3 (three) times daily for 10 days.   lidocaine 2 % solution Commonly known as: XYLOCAINE Use as directed 10 mLs in the mouth or throat every 4 (four) hours as needed for up to 5 days for mouth pain. Swish and spit   multivitamin with minerals Tabs tablet Take 1 tablet by mouth daily for 5 days. Start taking on: February 14, 2020   nicotine 21 mg/24hr patch Commonly known as: NICODERM CQ -  dosed in mg/24 hours Place 1 patch (21 mg total) onto the skin daily. Start taking on: February 14, 2020   oxyCODONE 5 MG immediate release tablet Commonly known as: Oxy IR/ROXICODONE Take 1 tablet (5 mg total) by mouth every 6 (six) hours as needed for up to 5 days for moderate pain.   phenol 1.4 % Liqd Commonly known as: CHLORASEPTIC Use as directed 1 spray in the mouth or throat as needed for throat irritation / pain.       Allergies  Allergen Reactions  . Penicillins Hives    Pt states he is not allergic, but that his sister was.     Procedures /Studies:   DG Chest Portable 1 View  Result Date: 02/07/2020 CLINICAL DATA:  Chest pain EXAM: PORTABLE CHEST 1 VIEW COMPARISON:  12/23/2019 FINDINGS: The heart size and mediastinal contours are within normal limits. Both lungs are clear. The visualized skeletal structures are unremarkable. IMPRESSION: No active disease. Electronically Signed   By: Alcide Clever M.D.   On: 02/07/2020 19:15   US Abdomen Limited RUQ  Result Date: 02/07/2020 CLINICAL DATA:  Elevated LFTs EXAM: ULTRASOUND ABDOMEN LIMITED RIGHT UPPER QUADRANT COMPARISON:  None. FINDINGS: Gallbladder: No gallstones or wall thickening visualized. No sonographic Murphy sign noted by sonographer. Common bile duct: Diameter: Normal caliber, 3 mm Liver: Increased echotexture compatible with fatty infiltration. No focal abnormality or biliary ductal dilatation. Portal vein is patent on color Doppler imaging with normal direction of blood flow towards the liver. Other: None. IMPRESSION: Fatty infiltration of the liver. No acute findings. Electronically Signed   By: Charlett Nose M.D.   On: 02/07/2020 21:01     Subjective:   Patient was seen and examined 02/13/2020, 11:30 AM Patient stable today. No acute distress.  No issues overnight Stable for discharge.  Discharge Exam:    Vitals:   02/12/20 1732 02/12/20 2105 02/13/20 0034 02/13/20 0404  BP: (!) 137/94 (!) 161/96 (!) 153/98  (!) 141/96  Pulse: (!) 108 (!) 106 (!) 107 93  Resp: 16 20 20 20   Temp: 98.2 F (36.8 C) 98.4 F (36.9 C) 98.3 F (36.8 C) 98.4 F (36.9 C)  TempSrc: Oral Oral Oral Oral  SpO2: 99% 100% 100% 100%  Weight:      Height:        General: Pt lying comfortably in bed & appears in no obvious distress. Cardiovascular: S1 & S2 heard, RRR, S1/S2 +. No murmurs, rubs, gallops or clicks. No JVD or pedal edema. Respiratory: Clear to auscultation without wheezing, rhonchi or crackles. No increased work of breathing.  Abdominal:  Non-distended, non-tender & soft. No organomegaly or masses appreciated. Normal bowel sounds heard. CNS: Alert and oriented. No focal deficits. Extremities: no edema, no cyanosis    The results of significant diagnostics from this hospitalization (including imaging, microbiology, ancillary and laboratory) are listed below for reference.      Microbiology:   Recent Results (from the past 240 hour(s))  SARS Coronavirus 2 by RT PCR (hospital order, performed in [facility] hospital lab) Nasopharyngeal Nasopharyngeal Swab     Status: None   Collection Time: 02/07/20  7:44 PM   Specimen: Nasopharyngeal Swab  Result Value Ref Range Status   SARS Coronavirus 2 NEGATIVE NEGATIVE Final    Comment: (NOTE) SARS-CoV-2 target nucleic acids are NOT DETECTED.  The SARS-CoV-2 RNA is generally detectable in upper and lower respiratory specimens during the acute phase of infection. The lowest concentration of SARS-CoV-2 viral copies this assay can detect is 250 copies / mL. A negative result does not preclude SARS-CoV-2 infection and should not be used as the sole basis for treatment or other patient management decisions.  A negative result may occur with improper specimen collection / handling, submission of specimen other than nasopharyngeal swab, presence of viral mutation(s) within the areas targeted by this assay, and inadequate number of viral copies (<250 copies / mL). A  negative result must be combined with clinical observations, patient history, and epidemiological information.  Fact Sheet for Patients:   BoilerBrush.com.cy  Fact Sheet for Healthcare Providers: https://pope.com/  This test is not yet approved or  cleared by the [place] FDA and has been authorized for detection and/or diagnosis of SARS-CoV-2 by FDA under an Emergency Use Authorization (EUA).  This EUA will remain in effect (meaning this test can be used) for the duration of the COVID-19 declaration under Section 564(b)(1) of the Act, 21 U.S.C. section 360bbb-3(b)(1), unless the authorization is terminated or revoked sooner.  Performed at [facility], [address] [place], [place] [address]   MRSA PCR Screening     Status: None   Collection Time: 02/10/20 10:03 AM   Specimen: Nasopharyngeal  Result Value Ref Range Status   MRSA by PCR NEGATIVE NEGATIVE Final    Comment:        The GeneXpert MRSA Assay (FDA approved for NASAL specimens only), is one component of a comprehensive MRSA colonization surveillance program. It is not intended to diagnose MRSA infection nor to guide or monitor treatment for MRSA infections. Performed at [facility], [address] [place], [place] [address]      Labs:   CBC: Recent Labs  Lab 02/07/20 1831 02/08/20 0114 02/08/20 0712 02/08/20 1101 02/08/20 1713  WBC 7.5  --   --   --   --   HGB 17.2* 17.2* 16.5 16.7 15.1  HCT 49.8 48.5 49.5 50.4 44.9  MCV 83.3  --   --   --   --   PLT 325  --   --   --   --    Basic Metabolic Panel: Recent Labs  Lab 02/07/20 1831 02/08/20 0712 02/08/20 1101 02/08/20 1101 02/09/20 1429 02/10/20 0607 02/11/20 0430 02/12/20 0559 02/13/20 0520  NA   < >  --  137   < > 134* 135 139 135 136  K   < >  --  4.5   < > 3.8 3.3* 4.0 4.1 4.0  CL   < >  --  101   < > 104 103 107  106 103  CO2   < >  --  21*   < > 23 22 25 22 25    GLUCOSE   < >  --  114*   < > 142* 110* 99 110* 123*  BUN   < >  --  26*   < > 20 14 12 16  21*  CREATININE   < >  --  0.90   < > 1.02 0.78 0.83 0.82 0.95  CALCIUM   < >  --  7.6*   < > 8.1* 7.9* 8.5* 8.7* 8.7*  MG  --   --  2.5*  --   --  2.1  --   --   --   PHOS  --  4.5  --   --   --   --   --   --   --    < > = values in this interval not displayed.   Liver Function Tests: Recent Labs  Lab 02/09/20 1429 02/10/20 0607 02/11/20 0430 02/12/20 0559 02/13/20 0520  AST 1,823* 877* 406* 161* 106*  ALT 2,269* 1,624* 1,182* 745* 539*  ALKPHOS 87 86 83 72 71  BILITOT 1.7* 1.3* 1.5* 1.2 1.1  PROT 5.7* 6.0* 6.6 6.8 6.8  ALBUMIN 3.2* 3.3* 3.6 3.5 3.8   BNP (last 3 results) No results for input(s): BNP in the last 8760 hours. Cardiac Enzymes: No results for input(s): CKTOTAL, CKMB, CKMBINDEX, TROPONINI in the last 168 hours. CBG: Recent Labs  Lab 02/10/20 0957  GLUCAP 126*   Hgb A1c No results for input(s): HGBA1C in the last 72 hours. Lipid Profile No results for input(s): CHOL, HDL, LDLCALC, TRIG, CHOLHDL, LDLDIRECT in the last 72 hours. Thyroid function studies No results for input(s): TSH, T4TOTAL, T3FREE, THYROIDAB in the last 72 hours.  Invalid input(s): FREET3 Anemia work up No results for input(s): VITAMINB12, FOLATE, FERRITIN, TIBC, IRON, RETICCTPCT in the last 72 hours. Urinalysis    Component Value Date/Time   COLORURINE YELLOW (A) 03/28/2019 1442   APPEARANCEUR CLEAR (A) 03/28/2019 1442   APPEARANCEUR Cloudy 09/09/2013 2112   LABSPEC 1.012 03/28/2019 1442   LABSPEC 1.014 09/09/2013 2112   PHURINE 6.0 03/28/2019 1442   GLUCOSEU NEGATIVE 03/28/2019 1442   GLUCOSEU Negative 09/09/2013 2112   HGBUR NEGATIVE 03/28/2019 1442   BILIRUBINUR NEGATIVE 03/28/2019 1442   BILIRUBINUR Negative 09/09/2013 2112   KETONESUR NEGATIVE 03/28/2019 1442   PROTEINUR NEGATIVE 03/28/2019 1442   NITRITE NEGATIVE 03/28/2019 1442   LEUKOCYTESUR NEGATIVE 03/28/2019 1442    LEUKOCYTESUR Negative 09/09/2013 2112         Time coordinating discharge: Over 45 minutes  SIGNED: 09/11/2013, MD, FACP, FHM. [facility],  Please use [email or web address] to Page If 7PM-7AM, please contact night-coverage [email or web address], Password [facility] 02/13/2020, 11:30 AM

## 2020-02-13 NOTE — Progress Notes (Signed)
Patient  Left  Unit on his own. Belongings with patient. Zachary Mendoza

## 2020-02-13 NOTE — TOC Transition Note (Signed)
Transition of Care [facility]) - CM/SW Discharge Note   Patient Details  Name: Zachary Clipper Stephen Jr. MRN: [id number] Date of Birth: 1990-10-31  Transition of Care [facility]) CM/SW Contact:  Chapman Fitch, RN Phone Number: 02/13/2020, 12:08 PM   Clinical Narrative:    Patient discharging today.  Patient no longer has copy of Medication Management  [facility]  Application.  Provided additional copy.  MD has sent prescriptions to [facility].  Provided goodrx coupons.  Prozac $4, Neurontin $8.91, Librium $7.96.  Coupons for pain medication not available.  Patient confirms he will be able to afford medications at discharge.  MD confirmed with patient he has enough oral antibiotic at home to completed dose      Barriers to Discharge: Continued Medical Work up   Patient Goals and CMS Choice        Discharge Placement                       Discharge Plan and Services   Discharge Planning Services: CM Consult Post Acute Care Choice: NA                               Social Determinants of Health (SDOH) Interventions     Readmission Risk Interventions Readmission Risk Prevention Plan 02/10/2020  Transportation Screening Complete  Medication Review (RN Care Manager) Complete  PCP or Specialist appointment within 3-5 days of discharge Complete  HRI or Home Care Consult Complete  Palliative Care Screening Not Applicable  Skilled Nursing Facility Not Applicable  Some recent data might be hidden

## 2020-02-13 NOTE — Progress Notes (Signed)
Discharge instructions given to patient verbally in person and printed handout also given. Prescriptions given to patient, [facility] given to patient. Patient waiting for lunch tray to arrive. Zachary Mendoza

## 2020-05-29 ENCOUNTER — Emergency Department
Admission: EM | Admit: 2020-05-29 | Discharge: 2020-05-29 | Disposition: A | Discharge status: Home / Self Care | Payer: Self-pay | Attending: Emergency Medicine | Admitting: Emergency Medicine

## 2020-05-29 ENCOUNTER — Encounter: Payer: Self-pay | Admitting: *Deleted

## 2020-05-29 ENCOUNTER — Other Ambulatory Visit: Payer: Self-pay

## 2020-05-29 DIAGNOSIS — F17210 Nicotine dependence, cigarettes, uncomplicated: Secondary | ICD-10-CM | POA: Insufficient documentation

## 2020-05-29 DIAGNOSIS — Z79899 Other long term (current) drug therapy: Secondary | ICD-10-CM | POA: Insufficient documentation

## 2020-05-29 DIAGNOSIS — I10 Essential (primary) hypertension: Secondary | ICD-10-CM | POA: Insufficient documentation

## 2020-05-29 DIAGNOSIS — K0889 Other specified disorders of teeth and supporting structures: Secondary | ICD-10-CM | POA: Insufficient documentation

## 2020-05-29 MED ORDER — DOXYCYCLINE MONOHYDRATE 100 MG PO TABS: 100.0000 mg | ORAL_TABLET | Freq: Two times a day (BID) | ORAL | 0 refills | Status: AC

## 2020-05-29 NOTE — Discharge Instructions (Signed)
OPTIONS FOR DENTAL FOLLOW UP CARE ° °[place] [facility] and Human Services - Local Safety Net Dental Clinics °http://www.ncdhhs.gov/dph/oralhealth/services/safetynetclinics.htm °  °[facility] (336-562-3123) ° °[facility] ([phone]) ° °[facility] ([phone] ext [phone]) ° °[facility] ([phone]) ° °[facility] ([phone]) °This clinic caters to the indigent population and is on a lottery system. °Location: °[facility], [address], [place] °Clinic Hours: °Wednesdays from 6pm - 9pm, patients seen by a lottery system. °For dates, call or go to [email or web address].edu/[facility]/patients/Dental-[facility] °Services: °Cleanings, fillings and simple extractions. °Payment Options: °DENTAL WORK IS FREE OF CHARGE. Bring proof of income or support. °Best way to get seen: °Arrive at 5:15 pm - this is a lottery, NOT first come/first serve, so arriving earlier will not increase your chances of being seen. °  °  °[facility] °[phone] °Select option 1 for emergencies °  °Location: °[facility], [address], [place] °Clinic Hours: °No walk-ins accepted - call the day before to schedule an appointment. °Check in times are 9:30 am and 1:30 pm. °Services: °Simple extractions, temporary fillings, pulpectomy/pulp debridement, uncomplicated abscess drainage. °Payment Options: °PAYMENT IS DUE AT THE TIME OF SERVICE.  Fee is usually $100-200, additional surgical procedures (e.g. abscess drainage) may be extra. °Cash, checks, Visa/MasterCard accepted.  Can file Medicaid if patient is covered for dental - patient should call case worker to check. °No discount for [facility] patients. °Best way to get seen: °MUST call the day before and get onto the schedule. Can usually be seen the next 1-2 days. No walk-ins accepted. °  °  °[facility] °[phone] °   °Location: °[facility], [address], [place] °Clinic Hours: °M, W, Th, F 8am or 1:30pm, Tues 9a or 1:30 - first come/first served. °Services: °Simple extractions, temporary fillings, uncomplicated abscess drainage.  You do not need to be an [place] resident. °Payment Options: °PAYMENT IS DUE AT THE TIME OF SERVICE. °Dental insurance, otherwise sliding scale - bring proof of income or support. °Depending on income and treatment needed, cost is usually $50-200. °Best way to get seen: °Arrive early as it is first come/first served. °  °  °[facility] °[phone] °  °Location: °[address] °Clinic Hours: °Mon-Thu 8a-5p °Services: °Most basic dental services including extractions and fillings. °Payment Options: °PAYMENT IS DUE AT THE TIME OF SERVICE. °Sliding scale, up to 50% off - bring proof if income or support. °Medicaid with dental option accepted. °Best way to get seen: °Call to schedule an appointment, can usually be seen within 2 weeks OR they will try to see walk-ins - show up at 8a or 2p (you may have to wait). °  °  °[facility] °[phone] °[place] RESIDENTS ONLY °  °Location: °[facility], [address], [place], [place] [address] °Clinic Hours: By appointment only. °Monday - Thursday 8am-5pm, Friday 8am-12pm °Services: Cleanings, fillings, extractions. °Payment Options: °PAYMENT IS DUE AT THE TIME OF SERVICE. °Cash, Visa or MasterCard. Sliding scale - $30 minimum per service. °Best way to get seen: °Come in to office, complete packet and make an appointment - need proof of income °or support monies for each household member and proof of [place] residence. °Usually takes about a month to get in. °  °  °[facility] °[phone] °  °Location: °[address]   [place] °Clinic Hours: Walk-in Urgent Care Dental Services are offered Monday-Friday  mornings only. °The numbers of emergencies accepted daily is limited to the number of °providers available. °Maximum 15 - Mondays, Wednesdays & Thursdays °Maximum 10 - Tuesdays & Fridays °Services: °You do not need to be a [place] resident to be seen for a dental emergency. °Emergencies are defined as pain, swelling, abnormal bleeding, or dental trauma. Walkins will receive x-rays if needed. °NOTE: Dental cleaning is not an emergency. °Payment Options: °PAYMENT IS DUE AT THE TIME OF SERVICE. °Minimum co-pay is $40.00 for uninsured patients. °Minimum co-pay is $3.00 for Medicaid with dental coverage. °Dental Insurance is accepted and must be presented at time of visit. °Medicare does not cover dental. °Forms of payment: Cash, credit card, checks. °Best way to get seen: °If not previously registered with the clinic, walk-in dental registration begins at 7:15 am and is on a first come/first serve basis. °If previously registered with the clinic, call to make an appointment. °  °  °[facility] °[phone] °[place] RESIDENTS ONLY °  °Location: °[address], [place], [place] °Clinic Hours: °Mon-Thu 10a-2p °Services: Extractions only! °Payment Options: °FREE (donations accepted) - bring proof of income or support °Best way to get seen: °Call and schedule an appointment OR come at 8am on the 1st Monday of every month (except for holidays) when it is first come/first served. °  °  °[facility] °[phone] °  °Location: °[address], [place] °Clinic Hours: °Friday mornings °Services, Payment Options, Best way to get seen: °Call for info °

## 2020-05-29 NOTE — ED Notes (Signed)
Pt not in room for d/c. Unable to provide paperwork or d/c teaching.

## 2020-05-29 NOTE — ED Provider Notes (Signed)
[facility] Emergency Department Provider Note  ____________________________________________   Event Date/Time   First MD Initiated Contact with Patient 05/29/20 1133     (approximate)  I have reviewed the triage vital signs and the nursing notes.   HISTORY  Chief Complaint Dental Pain  HPI Zachary L Lozito Montez Hageman. is a 29 y.o. male who presents to the emergency department for evaluation of dental pain.  The patient states that he has had progression of dental pain the right upper jaw over the last few days.  He currently rates his pain 8/10.  He states that he has a appointment with a dentist to have the tooth extracted next Thursday, in 7 days.  He reports that he believes this is a wisdom tooth that is coming in at an angle.  He denies any fevers, chills or other systemic symptoms.  He reports he "needs something for pain" because he has not slept in a few days.  He reports trying extra strength Tylenol, 600 mg ibuprofen without relief.  He also reports trying some of his wife's tramadol without relief.  He states that he was seen here before for dental pain and the only thing that works for him is oxycodone 10 mg.  He is currently sitting with an ice pack to his right cheek.        Past Medical History:  Diagnosis Date  . Hypertension   . Panic attacks     Patient Active Problem List   Diagnosis Date Noted  . Acute alcoholic hepatitis 02/08/2020  . Alcohol dependence with uncomplicated withdrawal (HCC) 02/08/2020  . Gastritis 02/08/2020  . Leukocytosis 10/04/2019  . Abnormal LFTs 10/04/2019  . Hematemesis 10/04/2019  . Nausea vomiting and diarrhea 10/04/2019  . Hypertension   . Tobacco abuse   . Abdominal pain   . Chest pain   . Esophagitis   . Alcohol abuse 03/29/2019  . Severe anxiety with panic 03/28/2019  . Alcoholic intoxication with complication [facility])     Past Surgical History:  Procedure Laterality Date  . ESOPHAGOGASTRODUODENOSCOPY  (EGD) WITH PROPOFOL N/A 10/04/2019   Procedure: ESOPHAGOGASTRODUODENOSCOPY (EGD) WITH PROPOFOL;  Surgeon: Midge Minium, MD;  Location: [facility] ENDOSCOPY;  Service: Endoscopy;  Laterality: N/A;    Prior to Admission medications   Medication Sig Start Date End Date Taking? Authorizing Provider  doxycycline (ADOXA) 100 MG tablet Take 1 tablet (100 mg total) by mouth 2 (two) times daily for 7 days. 05/29/20 06/05/20  Lucy Chris, PA  FLUoxetine (PROZAC) 20 MG capsule Take 1 capsule (20 mg total) by mouth at bedtime. 02/13/20 03/14/20  Shahmehdi, Gemma Payor, MD  gabapentin (NEURONTIN) 600 MG tablet Take 0.5 tablets (300 mg total) by mouth 3 (three) times daily for 10 days. 02/13/20 02/23/20  Shahmehdi, Gemma Payor, MD  nicotine (NICODERM CQ - DOSED IN MG/24 HOURS) 21 mg/24hr patch Place 1 patch (21 mg total) onto the skin daily. 02/14/20   Shahmehdi, Gemma Payor, MD  phenol (CHLORASEPTIC) 1.4 % LIQD Use as directed 1 spray in the mouth or throat as needed for throat irritation / pain. 02/13/20   Kendell Bane, MD    Allergies Penicillins  Family History  Problem Relation Age of Onset  . Hypertension Mother     Social History Social History   Tobacco Use  . Smoking status: Current Every Day Smoker    Packs/day: 1.00    Types: Cigarettes  . Smokeless tobacco: Never Used  Vaping Use  . Vaping  Use: Never used  Substance Use Topics  . Alcohol use: Not Currently    Comment: about week ago  . Drug use: Not Currently    Types: Methamphetamines    Review of Systems Constitutional: No fever/chills Eyes: No visual changes. ENT: + Right upper dental pain, no sore throat. Cardiovascular: Denies chest pain. Respiratory: Denies shortness of breath. Gastrointestinal: No abdominal pain.  No nausea, no vomiting.  No diarrhea.  No constipation. Genitourinary: Negative for dysuria. Musculoskeletal: Negative for back pain. Skin: Negative for rash. Neurological: Negative for headaches, focal weakness or  numbness.  ____________________________________________   PHYSICAL EXAM:  VITAL SIGNS: ED Triage Vitals  Enc Vitals Group     BP 05/29/20 1120 (!) 146/89     Pulse Rate 05/29/20 1120 87     Resp 05/29/20 1133 18     Temp 05/29/20 1120 98.5 F (36.9 C)     Temp Source 05/29/20 1120 Oral     SpO2 05/29/20 1120 99 %     Weight 05/29/20 1122 230 lb (104.3 kg)     Height 05/29/20 1121 5\' 9"  (1.753 m)     Head Circumference --      Peak Flow --      Pain Score 05/29/20 1127 8     Pain Loc --      Pain Edu? --      Excl. in GC? --    Constitutional: Alert and oriented. Well appearing and in no acute distress. Eyes: Conjunctivae are normal. PERRL. EOMI. Head: No facial swelling, atraumatic.  There is some mild right-sided erythema at the place of the patient's ice pack. Nose: No congestion/rhinnorhea. Mouth/Throat: Mucous membranes are moist.  Oropharynx non-erythematous.  There is mild gumline swelling along the posterior most right upper molar which appears to be not fully through.  No evidence of cavity of that tooth.   Neck: No stridor.   Cardiovascular: Normal rate, regular rhythm.   Good peripheral circulation. Respiratory: Normal respiratory effort.  No retractions.  Neurologic:  Normal speech and language. No gross focal neurologic deficits are appreciated. No gait instability. Skin:  Skin is warm, dry and intact. No rash noted. Psychiatric: Mood and affect are normal. Speech and behavior are normal.   ____________________________________________   INITIAL IMPRESSION / ASSESSMENT AND PLAN / ED COURSE  As part of my medical decision making, I reviewed the following data within the electronic MEDICAL RECORD NUMBER Nursing notes reviewed and incorporated, Notes from prior ED visits and  Controlled Substance Database        Patient is a 29 year old male who presents to the emergency department for evaluation of right upper side dental pain.  Patient reports acute worsening  over the last several days.  On physical exam, the patient is afebrile, there is no facial swelling, however there is some mild swelling around the posterior most right upper molar without any evidence for acute abscess.  Discussed the nature of dental pain with the patient.  Recommended coverage with antibiotic in the event that this is early onset of infection.  Also recommended Tylenol and ibuprofen 800 mg.  The patient was offered a lidocaine dental block as well as viscous lidocaine for treatment of his dental pain.  He declined these options, as he reiterates that oxycodone 10 is the only thing that is going to work for his pain.  He states that he still has viscous lidocaine at home and does not need a prescription for this.  He initially states that  he does not want the antibiotic either, and reports "I will just have my doctor prescribe", however ultimately does accept the prescribing of this.  He also asked if there was anyone else here who would prescribe him the oxycodone 10 as he states he was given it 3 months ago.  Review of the patient's chart does reveal that he was seen in our facility multiple times in July and August and was prescribed oxycodone 10 mg at that time for a reported dental complaint on the left-hand side.  Advised the patient that given that he already has an appointment with the dentist, he could discuss with them if they would like him to have pain medication before his extraction.  The patient left before discharge instructions could be provided to him.      ____________________________________________   FINAL CLINICAL IMPRESSION(S) / ED DIAGNOSES  Final diagnoses:  Pain, dental     ED Discharge Orders         Ordered    doxycycline (ADOXA) 100 MG tablet  2 times daily        05/29/20 1212          *Please note:  Zachary L Dues Jr. was evaluated in Emergency Department on 05/29/2020 for the symptoms described in the history of present illness. He was  evaluated in the context of the global COVID-19 pandemic, which necessitated consideration that the patient might be at risk for infection with the SARS-CoV-2 virus that causes COVID-19. Institutional protocols and algorithms that pertain to the evaluation of patients at risk for COVID-19 are in a state of rapid change based on information released by regulatory bodies including the CDC and federal and state organizations. These policies and algorithms were followed during the patient's care in the ED.  Some ED evaluations and interventions may be delayed as a result of limited staffing during and the pandemic.*   Note:  This document was prepared using Dragon voice recognition software and may include unintentional dictation errors.    Lucy Chris, PA 05/29/20 1329    Sharyn Creamer, MD 05/29/20 [phone]

## 2020-05-29 NOTE — ED Triage Notes (Signed)
Pt has severe right upper dental pain, has appointment to have tooth pulled next Thursday but the pain has been unbearable and pt has been unable to sleep due to this, no fever.

## 2020-06-14 HISTORY — PX: FRACTURE SURGERY: SHX138

## 2020-07-02 ENCOUNTER — Emergency Department (HOSPITAL_COMMUNITY): Payer: Worker's Compensation

## 2020-07-02 ENCOUNTER — Encounter (HOSPITAL_COMMUNITY): Payer: Self-pay | Admitting: Emergency Medicine

## 2020-07-02 ENCOUNTER — Emergency Department (HOSPITAL_COMMUNITY)
Admission: EM | Admit: 2020-07-02 | Discharge: 2020-07-02 | Disposition: A | Discharge status: Home / Self Care | Payer: Worker's Compensation | Attending: Emergency Medicine | Admitting: Emergency Medicine

## 2020-07-02 ENCOUNTER — Telehealth: Payer: Self-pay | Admitting: Surgery

## 2020-07-02 DIAGNOSIS — Z20822 Contact with and (suspected) exposure to covid-19: Secondary | ICD-10-CM | POA: Diagnosis not present

## 2020-07-02 DIAGNOSIS — S82401A Unspecified fracture of shaft of right fibula, initial encounter for closed fracture: Secondary | ICD-10-CM | POA: Insufficient documentation

## 2020-07-02 DIAGNOSIS — S9304XA Dislocation of right ankle joint, initial encounter: Secondary | ICD-10-CM | POA: Diagnosis not present

## 2020-07-02 DIAGNOSIS — I10 Essential (primary) hypertension: Secondary | ICD-10-CM | POA: Diagnosis not present

## 2020-07-02 DIAGNOSIS — Y990 Civilian activity done for income or pay: Secondary | ICD-10-CM | POA: Insufficient documentation

## 2020-07-02 DIAGNOSIS — S8991XA Unspecified injury of right lower leg, initial encounter: Secondary | ICD-10-CM | POA: Diagnosis present

## 2020-07-02 DIAGNOSIS — F17210 Nicotine dependence, cigarettes, uncomplicated: Secondary | ICD-10-CM | POA: Diagnosis not present

## 2020-07-02 DIAGNOSIS — W19XXXA Unspecified fall, initial encounter: Secondary | ICD-10-CM

## 2020-07-02 DIAGNOSIS — M549 Dorsalgia, unspecified: Secondary | ICD-10-CM | POA: Insufficient documentation

## 2020-07-02 DIAGNOSIS — W000XXA Fall on same level due to ice and snow, initial encounter: Secondary | ICD-10-CM | POA: Diagnosis not present

## 2020-07-02 LAB — BASIC METABOLIC PANEL
Anion gap: 9 (ref 5–15)
BUN: 16 mg/dL (ref 6–20)
CO2: 25 mmol/L (ref 22–32)
Calcium: 9.2 mg/dL (ref 8.9–10.3)
Chloride: 104 mmol/L (ref 98–111)
Creatinine, Ser: 0.96 mg/dL (ref 0.61–1.24)
GFR, Estimated: >60 mL/min (ref >60)
Glucose, Bld: 108 mg/dL — ABNORMAL HIGH (ref 70–99)
Potassium: 4.6 mmol/L (ref 3.5–5.1)
Sodium: 138 mmol/L (ref 135–145)

## 2020-07-02 LAB — CBC WITH DIFFERENTIAL/PLATELET
Abs Immature Granulocytes: 0.02 10*3/uL (ref 0.00–0.07)
Basophils Absolute: 0 10*3/uL (ref 0.0–0.1)
Basophils Relative: 1 %
Eosinophils Absolute: 0.1 10*3/uL (ref 0.0–0.5)
Eosinophils Relative: 1 %
HCT: 43.3 % (ref 39.0–52.0)
Hemoglobin: 14.8 g/dL (ref 13.0–17.0)
Immature Granulocytes: 0 %
Lymphocytes Relative: 15 %
Lymphs Abs: 1.3 10*3/uL (ref 0.7–4.0)
MCH: 29.2 pg (ref 26.0–34.0)
MCHC: 34.2 g/dL (ref 30.0–36.0)
MCV: 85.4 fL (ref 80.0–100.0)
Monocytes Absolute: 0.7 10*3/uL (ref 0.1–1.0)
Monocytes Relative: 8 %
Neutro Abs: 6.4 10*3/uL (ref 1.7–7.7)
Neutrophils Relative %: 75 %
Platelets: 289 10*3/uL (ref 150–400)
RBC: 5.07 MIL/uL (ref 4.22–5.81)
RDW: 12.3 % (ref 11.5–15.5)
WBC: 8.5 10*3/uL (ref 4.0–10.5)
nRBC: 0 % (ref 0.0–0.2)

## 2020-07-02 LAB — RESP PANEL BY RT-PCR (FLU A&B, COVID) ARPGX2
Influenza A by PCR: NEGATIVE
Influenza B by PCR: NEGATIVE
SARS Coronavirus 2 by RT PCR: NEGATIVE

## 2020-07-02 MED ORDER — PROPOFOL 10 MG/ML IV BOLUS
INTRAVENOUS | Status: AC | PRN
Start: 2020-07-02 — End: 2020-07-02
  Administered 2020-07-02 (×4): 50 mg via INTRAVENOUS

## 2020-07-02 MED ORDER — PROPOFOL 10 MG/ML IV BOLUS
0.5000 mg/kg | Freq: Once | INTRAVENOUS | Status: DC
  Filled 2020-07-02: qty 20

## 2020-07-02 MED ORDER — MORPHINE SULFATE (PF) 4 MG/ML IV SOLN
4.0000 mg | Freq: Once | INTRAVENOUS | Status: AC
  Administered 2020-07-02: 4 mg via INTRAVENOUS
  Filled 2020-07-02: qty 1

## 2020-07-02 MED ORDER — OXYCODONE-ACETAMINOPHEN 5-325 MG PO TABS: 1.0000 | ORAL_TABLET | ORAL | 0 refills | Status: DC | PRN

## 2020-07-02 MED ORDER — MORPHINE SULFATE (PF) 4 MG/ML IV SOLN
8.0000 mg | Freq: Once | INTRAVENOUS | Status: AC
  Administered 2020-07-02: 8 mg via INTRAVENOUS
  Filled 2020-07-02: qty 2

## 2020-07-02 MED ORDER — ONDANSETRON HCL 4 MG/2ML IJ SOLN
4.0000 mg | Freq: Once | INTRAMUSCULAR | Status: AC
  Administered 2020-07-02: 4 mg via INTRAVENOUS
  Filled 2020-07-02: qty 2

## 2020-07-02 MED ORDER — SODIUM CHLORIDE 0.9 % IV BOLUS
1000.0000 mL | Freq: Once | INTRAVENOUS | Status: AC
  Administered 2020-07-02: 1000 mL via INTRAVENOUS

## 2020-07-02 NOTE — Consult Note (Signed)
Reason for Consult:Right ankle fx Referring Physician: Abran Duke Time called: 1139 Time at bedside: 1230   Zachary L Chaikin Montez Hageman. is an 30 y.o. male.  HPI: Zachary Mendoza was working today and slipped on some ice. His feet went in opposite directions until he heard a loud pop, had immediate pain, and his ankle deformed. He was unable to bear weight afterwards. He was brought to the ED where x-rays showed an ankle fx and orthopedic surgery was consulted. He works as an Personnel officer.  Past Medical History:  Diagnosis Date  . Hypertension   . Panic attacks     Past Surgical History:  Procedure Laterality Date  . ESOPHAGOGASTRODUODENOSCOPY (EGD) WITH PROPOFOL N/A 10/04/2019   Procedure: ESOPHAGOGASTRODUODENOSCOPY (EGD) WITH PROPOFOL;  Surgeon: Midge Minium, MD;  Location: [facility] ENDOSCOPY;  Service: Endoscopy;  Laterality: N/A;    Family History  Problem Relation Age of Onset  . Hypertension Mother     Social History:  reports that he has been smoking cigarettes. He has been smoking about 1.00 pack per day. He has never used smokeless tobacco. He reports previous alcohol use. He reports previous drug use. Drug: Methamphetamines.  Allergies:  Allergies  Allergen Reactions  . Penicillins Hives    Pt states he is not allergic, but that his sister was.    Medications: I have reviewed the patient's current medications.  Results for orders placed or performed during the hospital encounter of 07/02/20 (from the past 48 hour(s))  CBC with Differential     Status: None   Collection Time: 07/02/20 10:52 AM  Result Value Ref Range   WBC 8.5 4.0 - 10.5 K/uL   RBC 5.07 4.22 - 5.81 MIL/uL   Hemoglobin 14.8 13.0 - 17.0 g/dL   HCT 76.7 34.1 - 93.7 %   MCV 85.4 80.0 - 100.0 fL   MCH 29.2 26.0 - 34.0 pg   MCHC 34.2 30.0 - 36.0 g/dL   RDW 90.2 40.9 - 73.5 %   Platelets 289 150 - 400 K/uL   nRBC 0.0 0.0 - 0.2 %   Neutrophils Relative % 75 %   Neutro Abs 6.4 1.7 - 7.7 K/uL   Lymphocytes Relative 15 %    Lymphs Abs 1.3 0.7 - 4.0 K/uL   Monocytes Relative 8 %   Monocytes Absolute 0.7 0.1 - 1.0 K/uL   Eosinophils Relative 1 %   Eosinophils Absolute 0.1 0.0 - 0.5 K/uL   Basophils Relative 1 %   Basophils Absolute 0.0 0.0 - 0.1 K/uL   Immature Granulocytes 0 %   Abs Immature Granulocytes 0.02 0.00 - 0.07 K/uL    Comment: Performed at [facility], [address], [place], [place] [address]  Basic metabolic panel     Status: Abnormal   Collection Time: 07/02/20 10:52 AM  Result Value Ref Range   Sodium 138 135 - 145 mmol/L   Potassium 4.6 3.5 - 5.1 mmol/L   Chloride 104 98 - 111 mmol/L   CO2 25 22 - 32 mmol/L   Glucose, Bld 108 (H) 70 - 99 mg/dL    Comment: Glucose reference range applies only to samples taken after fasting for at least 8 hours.   BUN 16 6 - 20 mg/dL   Creatinine, Ser 4.26 0.61 - 1.24 mg/dL   Calcium 9.2 8.9 - 83.4 mg/dL   GFR, Estimated >19 >62 mL/min    Comment: (NOTE) Calculated using the CKD-EPI Creatinine Equation (2021)    Anion gap 9 5 - 15  Comment: Performed at [facility], [address] [place], [place] [address]  Resp Panel by RT-PCR (Flu A&B, Covid) Nasopharyngeal Swab     Status: None   Collection Time: 07/02/20 10:52 AM   Specimen: Nasopharyngeal Swab; Nasopharyngeal(NP) swabs in vial transport medium  Result Value Ref Range   SARS Coronavirus 2 by RT PCR NEGATIVE NEGATIVE    Comment: (NOTE) SARS-CoV-2 target nucleic acids are NOT DETECTED.  The SARS-CoV-2 RNA is generally detectable in upper respiratory specimens during the acute phase of infection. The lowest concentration of SARS-CoV-2 viral copies this assay can detect is 138 copies/mL. A negative result does not preclude SARS-Cov-2 infection and should not be used as the sole basis for treatment or other patient management decisions. A negative result may occur with  improper specimen collection/handling, submission of specimen other than nasopharyngeal swab, presence of viral  mutation(s) within the areas targeted by this assay, and inadequate number of viral copies(<138 copies/mL). A negative result must be combined with clinical observations, patient history, and epidemiological information. The expected result is Negative.  Fact Sheet for Patients:  BloggerCourse.com  Fact Sheet for Healthcare Providers:  SeriousBroker.it  This test is no t yet approved or cleared by the [place] FDA and  has been authorized for detection and/or diagnosis of SARS-CoV-2 by FDA under an Emergency Use Authorization (EUA). This EUA will remain  in effect (meaning this test can be used) for the duration of the COVID-19 declaration under Section 564(b)(1) of the Act, 21 U.S.C.section 360bbb-3(b)(1), unless the authorization is terminated  or revoked sooner.       Influenza A by PCR NEGATIVE NEGATIVE   Influenza B by PCR NEGATIVE NEGATIVE    Comment: (NOTE) The Xpert Xpress SARS-CoV-2/FLU/RSV plus assay is intended as an aid in the diagnosis of influenza from Nasopharyngeal swab specimens and should not be used as a sole basis for treatment. Nasal washings and aspirates are unacceptable for Xpert Xpress SARS-CoV-2/FLU/RSV testing.  Fact Sheet for Patients: BloggerCourse.com  Fact Sheet for Healthcare Providers: SeriousBroker.it  This test is not yet approved or cleared by the [place] FDA and has been authorized for detection and/or diagnosis of SARS-CoV-2 by FDA under an Emergency Use Authorization (EUA). This EUA will remain in effect (meaning this test can be used) for the duration of the COVID-19 declaration under Section 564(b)(1) of the Act, 21 U.S.C. section 360bbb-3(b)(1), unless the authorization is terminated or revoked.  Performed at [facility], [address], [place], [place] [address]     No results found.  Review of Systems   HENT: Negative for ear discharge, ear pain, hearing loss and tinnitus.   Eyes: Negative for photophobia and pain.  Respiratory: Negative for cough and shortness of breath.   Cardiovascular: Negative for chest pain.  Gastrointestinal: Negative for abdominal pain, nausea and vomiting.  Genitourinary: Negative for dysuria, flank pain, frequency and urgency.  Musculoskeletal: Positive for arthralgias (Right lower leg/ankle). Negative for back pain, myalgias and neck pain.  Neurological: Negative for dizziness and headaches.  Hematological: Does not bruise/bleed easily.  Psychiatric/Behavioral: The patient is not nervous/anxious.    Blood pressure (!) 136/93, pulse 74, resp. rate 17, SpO2 99 %. Physical Exam Constitutional:      General: He is not in acute distress.    Appearance: He is well-developed and well-nourished. He is not diaphoretic.  HENT:     Head: Normocephalic and atraumatic.  Eyes:     General: No scleral icterus.  Right eye: No discharge.        Left eye: No discharge.     Conjunctiva/sclera: Conjunctivae normal.  Cardiovascular:     Rate and Rhythm: Normal rate and regular rhythm.  Pulmonary:     Effort: Pulmonary effort is normal. No respiratory distress.  Musculoskeletal:     Cervical back: Normal range of motion.     Comments: LLE No traumatic wounds, ecchymosis, or rash  Severe edema, deformity right ankle  No knee effusion  Knee stable to varus/ valgus and anterior/posterior stress  Sens DPN, SPN, TN intact  Motor EHL 5/5  DP 1+, PT 0, No significant edema  Skin:    General: Skin is warm and dry.  Neurological:     Mental Status: He is alert.  Psychiatric:        Mood and Affect: Mood and affect normal.        Behavior: Behavior normal.     Assessment/Plan: Right ankle fx -- Will do CR under CS via EDP. If successful can go home with NWB and elevation to f/u in office for discussion of ORIF. Tobacco use    Freeman Caldron,  PA-C Orthopedic Surgery [phone] 07/02/2020, 12:40 PM

## 2020-07-02 NOTE — Sedation Documentation (Signed)
X-ray at bedside

## 2020-07-02 NOTE — ED Provider Notes (Signed)
[facility] EMERGENCY DEPARTMENT Provider Note   CSN: [id number] Arrival date & time: 07/02/20  1011     History No chief complaint on file.   Zachary Mendoza. is a 30 y.o. male.  Patient with history of high blood pressure, alcohol use, anxiety presents with right lower extremity and ankle injury.  Patient was at work and slipped on the ice causing sudden crack sensation and pain.  Patient has mild low back pain however normally has back pain.  No other direct trauma injuries.  Pain with any attempted range of motion, no weakness appreciated.        Past Medical History:  Diagnosis Date  . Hypertension   . Panic attacks     Patient Active Problem List   Diagnosis Date Noted  . Acute alcoholic hepatitis 02/08/2020  . Alcohol dependence with uncomplicated withdrawal (HCC) 02/08/2020  . Gastritis 02/08/2020  . Leukocytosis 10/04/2019  . Abnormal LFTs 10/04/2019  . Hematemesis 10/04/2019  . Nausea vomiting and diarrhea 10/04/2019  . Hypertension   . Tobacco abuse   . Abdominal pain   . Chest pain   . Esophagitis   . Alcohol abuse 03/29/2019  . Severe anxiety with panic 03/28/2019  . Alcoholic intoxication with complication [facility])     Past Surgical History:  Procedure Laterality Date  . ESOPHAGOGASTRODUODENOSCOPY (EGD) WITH PROPOFOL N/A 10/04/2019   Procedure: ESOPHAGOGASTRODUODENOSCOPY (EGD) WITH PROPOFOL;  Surgeon: Midge Minium, MD;  Location: [facility] ENDOSCOPY;  Service: Endoscopy;  Laterality: N/A;       Family History  Problem Relation Age of Onset  . Hypertension Mother     Social History   Tobacco Use  . Smoking status: Current Every Day Smoker    Packs/day: 1.00    Types: Cigarettes  . Smokeless tobacco: Never Used  Vaping Use  . Vaping Use: Never used  Substance Use Topics  . Alcohol use: Not Currently    Comment: about week ago  . Drug use: Not Currently    Types: Methamphetamines    Home Medications Prior to Admission  medications   Medication Sig Start Date End Date Taking? Authorizing Provider  acetaminophen (TYLENOL) 500 MG tablet Take 500 mg by mouth every 6 (six) hours as needed.   Yes [provider]  methocarbamol (ROBAXIN) 500 MG tablet Take 500 mg by mouth every 6 (six) hours as needed for muscle spasms.   Yes [provider]  oxyCODONE-acetaminophen (PERCOCET) 5-325 MG tablet Take 1-2 tablets by mouth every 4 (four) hours as needed for severe pain. 07/02/20  Yes Blane Ohara, MD    Allergies    Penicillins  Review of Systems   Review of Systems  Constitutional: Negative for chills and fever.  HENT: Negative for congestion.   Eyes: Negative for visual disturbance.  Respiratory: Negative for shortness of breath.   Cardiovascular: Negative for chest pain.  Gastrointestinal: Negative for abdominal pain and vomiting.  Genitourinary: Negative for dysuria and flank pain.  Musculoskeletal: Positive for gait problem and joint swelling. Negative for back pain, neck pain and neck stiffness.  Skin: Positive for wound. Negative for rash.  Neurological: Negative for weakness, light-headedness and headaches.    Physical Exam Updated Vital Signs BP 125/85   Pulse 65   Temp 98.5 F (36.9 C) (Oral)   Resp (!) 22   Wt 104.3 kg   SpO2 100%   BMI 33.97 kg/m   Physical Exam Vitals and nursing note reviewed.  Constitutional:  Appearance: He is well-developed and well-nourished.  HENT:     Head: Normocephalic and atraumatic.  Eyes:     General:        Right eye: No discharge.        Left eye: No discharge.     Conjunctiva/sclera: Conjunctivae normal.  Neck:     Trachea: No tracheal deviation.  Cardiovascular:     Rate and Rhythm: Normal rate and regular rhythm.  Pulmonary:     Effort: Pulmonary effort is normal.     Breath sounds: Normal breath sounds.  Abdominal:     General: There is no distension.     Palpations: Abdomen is soft.     Tenderness: There is no  abdominal tenderness. There is no guarding.  Musculoskeletal:        General: Swelling, tenderness, deformity and signs of injury present. No edema.     Cervical back: Normal range of motion and neck supple.     Comments: Patient has significant tenderness mid tibia to ankle on the right with anterior deformity of distal tibia.  No open wounds appreciated.  Neurovascularly intact.  Pain to palpation with any attempted range of motion of the distal tibia or fibula.  Compartments soft.  No tenderness to find the right.  No midline cervical tenderness.  Skin:    General: Skin is warm.     Capillary Refill: Capillary refill takes less than 2 seconds.     Findings: No rash.  Neurological:     General: No focal deficit present.     Mental Status: He is alert.  Psychiatric:        Mood and Affect: Mood is anxious.     ED Results / Procedures / Treatments   Labs (all labs ordered are listed, but only abnormal results are displayed) Labs Reviewed  BASIC METABOLIC PANEL - Abnormal; Notable for the following components:      Result Value   Glucose, Bld 108 (*)    All other components within normal limits  RESP PANEL BY RT-PCR (FLU A&B, COVID) ARPGX2  CBC WITH DIFFERENTIAL/PLATELET    EKG None  Radiology DG Pelvis 1-2 Views  Result Date: 07/02/2020 CLINICAL DATA:  Fall with right leg pain EXAM: PELVIS - 1-2 VIEW COMPARISON:  None. FINDINGS: There is no evidence of pelvic fracture or diastasis. No pelvic bone lesions are seen. IMPRESSION: Negative. Electronically Signed   By: Duanne Guess D.O.   On: 07/02/2020 12:40   DG Tibia/Fibula Right  Result Date: 07/02/2020 CLINICAL DATA:  Fall with right ankle pain EXAM: RIGHT ANKLE - COMPLETE 3+ VIEW; RIGHT TIBIA AND FIBULA - 2 VIEW COMPARISON:  None. FINDINGS: Acute oblique fracture through the distal fibular metaphysis with anterior and valgus angulation. Marked widening of the distal tibiofibular joint compatible with underlying syndesmotic  injury. There is posterior and lateral dislocation of the talus relative to the tibial plafond. Tibia appears intact without evidence of fracture. No fracture or malalignment of the proximal tibia or fibula. There is soft tissue swelling at the fracture site. IMPRESSION: 1. Acute oblique fracture through the distal fibular metaphysis with anterior and valgus angulation. Marked widening of the distal tibiofibular joint compatible with underlying syndesmotic injury. 2. Posterolateral dislocation of the talus relative to the tibial plafond. Electronically Signed   By: Duanne Guess D.O.   On: 07/02/2020 12:39   DG Ankle Complete Right  Result Date: 07/02/2020 CLINICAL DATA:  Fall with right ankle pain EXAM: RIGHT ANKLE - COMPLETE 3+  VIEW; RIGHT TIBIA AND FIBULA - 2 VIEW COMPARISON:  None. FINDINGS: Acute oblique fracture through the distal fibular metaphysis with anterior and valgus angulation. Marked widening of the distal tibiofibular joint compatible with underlying syndesmotic injury. There is posterior and lateral dislocation of the talus relative to the tibial plafond. Tibia appears intact without evidence of fracture. No fracture or malalignment of the proximal tibia or fibula. There is soft tissue swelling at the fracture site. IMPRESSION: 1. Acute oblique fracture through the distal fibular metaphysis with anterior and valgus angulation. Marked widening of the distal tibiofibular joint compatible with underlying syndesmotic injury. 2. Posterolateral dislocation of the talus relative to the tibial plafond. Electronically Signed   By: Duanne Guess D.O.   On: 07/02/2020 12:39   DG Ankle Right Port  Result Date: 07/02/2020 CLINICAL DATA:  Ankle dislocation.  Post reduction. EXAM: PORTABLE RIGHT ANKLE - 2 VIEW COMPARISON:  07/02/2020. FINDINGS: Post reduction fracture dislocation right ankle. Near anatomic alignment. IMPRESSION: Post reduction fracture dislocation right ankle with near anatomic  alignment. Electronically Signed   By: Maisie Fus  Register   On: 07/02/2020 14:30   DG FEMUR, MIN 2 VIEWS RIGHT  Result Date: 07/02/2020 CLINICAL DATA:  Fall with right leg pain EXAM: RIGHT FEMUR 2 VIEWS COMPARISON:  None. FINDINGS: There is no evidence of fracture or other focal bone lesions. Soft tissues are unremarkable. IMPRESSION: Negative. Electronically Signed   By: Duanne Guess D.O.   On: 07/02/2020 12:41    Procedures .Sedation  Date/Time: 07/02/2020 3:36 PM Performed by: Blane Ohara, MD Authorized by: Blane Ohara, MD   Consent:    Consent obtained:  Verbal and written   Consent given by:  Patient   Risks discussed:  Allergic reaction, dysrhythmia, inadequate sedation, nausea, vomiting, respiratory compromise necessitating ventilatory assistance and intubation, prolonged sedation necessitating reversal and prolonged hypoxia resulting in organ damage Universal protocol:    Procedure explained and questions answered to patient or proxy's satisfaction: yes     Immediately prior to procedure, a time out was called: yes   Indications:    Procedure performed:  Dislocation reduction   Procedure necessitating sedation performed by:  Different physician Pre-sedation assessment:    Time since last food or drink:  6   NPO status caution: urgency dictates proceeding with non-ideal NPO status     ASA classification: class 1 - normal, healthy patient     Mouth opening:  3 or more finger widths   Thyromental distance:  4 finger widths   Mallampati score:  I - soft palate, uvula, fauces, pillars visible   Neck mobility: normal     Pre-sedation assessments completed and reviewed: pre-procedure airway patency not reviewed, pre-procedure cardiovascular function not reviewed, pre-procedure hydration status not reviewed, pre-procedure mental status not reviewed, pre-procedure nausea and vomiting status not reviewed, pre-procedure pain level not reviewed, pre-procedure respiratory function  not reviewed and pre-procedure temperature not reviewed     Pre-sedation assessment completed:  07/02/2020 1:42 PM Immediate pre-procedure details:    Reassessment: Patient reassessed immediately prior to procedure     Reviewed: vital signs, relevant labs/tests and NPO status     Verified: bag valve mask available, emergency equipment available, intubation equipment available, IV patency confirmed, oxygen available, reversal medications available and suction available   Procedure details (see MAR for exact dosages):    Preoxygenation:  Nasal cannula   Sedation:  Propofol   Intended level of sedation: deep   Analgesia:  Morphine   Intra-procedure monitoring:  Blood  pressure monitoring, cardiac monitor, continuous pulse oximetry, continuous capnometry, frequent LOC assessments and frequent vital sign checks   Intra-procedure events: none     Total Provider sedation time (minutes):  25 Post-procedure details:    Post-sedation assessment completed:  07/02/2020 3:00 PM   Attendance: Constant attendance by certified staff until patient recovered     Recovery: Patient returned to pre-procedure baseline     Post-sedation assessments completed and reviewed: post-procedure airway patency not reviewed, post-procedure cardiovascular function not reviewed, post-procedure hydration status not reviewed, post-procedure mental status not reviewed, post-procedure nausea and vomiting status not reviewed, pain score not reviewed, post-procedure respiratory function not reviewed and post-procedure temperature not reviewed     Patient is stable for discharge or admission: yes     Procedure completion:  Tolerated well, no immediate complications   (including critical care time)  Medications Ordered in ED Medications  propofol (DIPRIVAN) 10 mg/mL bolus/IV push 52.2 mg (has no administration in time range)  morphine 4 MG/ML injection 8 mg (8 mg Intravenous Given 07/02/20 1048)  morphine 4 MG/ML injection 8 mg (8 mg  Intravenous Given 07/02/20 1125)  sodium chloride 0.9 % bolus 1,000 mL (0 mLs Intravenous Stopped 07/02/20 1434)  ondansetron (ZOFRAN) injection 4 mg (4 mg Intravenous Given 07/02/20 1123)  propofol (DIPRIVAN) 10 mg/mL bolus/IV push (50 mg Intravenous Given 07/02/20 1359)  morphine 4 MG/ML injection 4 mg (4 mg Intravenous Given 07/02/20 1425)    ED Course  I have reviewed the triage vital signs and the nursing notes.  Pertinent labs & imaging results that were available during my care of the patient were reviewed by me and considered in my medical decision making (see chart for details).    MDM Rules/Calculators/A&P                          Patient presents after mechanical fall and clinical concern for complicated ankle/distal tibia fracture and dislocation.  Repeat morphine given twice IV.  X-rays ordered and reviewed showing distal fibula fracture and anterior tibial dislocation with widening of the joint.  Discussed with orthopedics plan for sedation and reduction in the emergency room.  Discussed risks and benefits of procedural sedation and reduction.  X-ray reviewed and patient agrees with plan.  Propofol be utilized. Procedural sedation went well, respiratory therapy in the room, orthopedic technician and clinician assisting with reduction.  Patient reduced, splinted/cast and outpatient follow-up discussed.  Patient woke up gradually without any difficulties and tolerated oral liquids. Repeat pain medicines were given.   Final Clinical Impression(s) / ED Diagnoses Final diagnoses:  Fall  Ankle dislocation, right, initial encounter  Closed fracture of shaft of right fibula, unspecified fracture morphology, initial encounter    Rx / DC Orders ED Discharge Orders         Ordered    oxyCODONE-acetaminophen (PERCOCET) 5-325 MG tablet  Every 4 hours PRN        07/02/20 1532           Blane OharaZavitz, Haani Bakula, MD 07/02/20 1540

## 2020-07-02 NOTE — Procedures (Signed)
Procedure: Right ankle closed reduction  Indication: Right ankle fracture  Surgeon: Charma Igo, PA-C  Assist: None  Anesthesia: Propofol via EDP  EBL: None  Complications: None  Findings: After risks/benefits explained patient desires to undergo procedure. Consent obtained and time out performed. Anesthesia given and efficacy confirmed. Ankle reduced and splinted/molded. Pt tolerated the procedure well. Post-reduction films confirmed adequate reduction.    Freeman Caldron, PA-C Orthopedic Surgery [phone]

## 2020-07-02 NOTE — ED Triage Notes (Signed)
Pt here from work where he slipped and fell on ice , right ankle pain ,low back pain  no loc ,  Fentanyl  Given by  ems

## 2020-07-02 NOTE — Telephone Encounter (Signed)
ED CM received call from patient stating that the [facility] where his prescription was supposed to be sent has had an network outage and prescription was not received.  CM reviewed record and noted that transmission was marked as failed. CM attempted to call pharmacy phone line is down. Patient has requested that prescription be sent to [facility] at [address] [facility] in [place].  Message was sent to EDP awaiting a response.

## 2020-07-02 NOTE — ED Notes (Signed)
Pt is ready for xray

## 2020-07-02 NOTE — Discharge Instructions (Signed)
Do not put weight down on right leg.  Keep splint intact and dry.  Keep leg elevated above heart whenever possible.  Ice (over splint) for 30 minutes 4 times per day.

## 2020-07-02 NOTE — ED Notes (Signed)
Calling ortho

## 2020-07-02 NOTE — ED Notes (Signed)
Ortho at bedside to provide instruction with crutches

## 2020-07-02 NOTE — Telephone Encounter (Signed)
ED CM received second call from patient concerning prescription, ED CM sent message to EDP.

## 2020-07-02 NOTE — ED Notes (Signed)
Pt is being picked up by wife.

## 2020-07-02 NOTE — Sedation Documentation (Signed)
Pt called his wife

## 2020-07-03 ENCOUNTER — Telehealth (HOSPITAL_COMMUNITY): Payer: Self-pay | Admitting: Emergency Medicine

## 2020-07-03 MED ORDER — OXYCODONE-ACETAMINOPHEN 5-325 MG PO TABS: 1.0000 | ORAL_TABLET | ORAL | 0 refills | Status: DC | PRN

## 2020-07-03 MED ORDER — OXYCODONE-ACETAMINOPHEN 5-325 MG PO TABS: 2.0000 | ORAL_TABLET | ORAL | 0 refills | Status: DC | PRN

## 2020-07-03 NOTE — Telephone Encounter (Signed)
Pt called regarding pharmacy not receiving e-script. RNCM notified EDP Laveda Norman to resend.

## 2020-07-28 IMAGING — CT CT CHEST W/ CM
2 of 3 series · 15 of 36 positions shown, 18 images · IV contrast (omnipaque)
Comparison: None.

CLINICAL DATA: Vomiting. Hematemesis. Chest pain.

EXAM:
CT CHEST WITH CONTRAST
TECHNIQUE: Multidetector CT imaging of the chest was performed during
intravenous contrast administration.
CONTRAST:  75mL OMNIPAQUE IOHEXOL 300 MG/ML  SOLN

[Series 2: axial st · axial · 0.71mm/px · z∈[-335,-73]mm · 12 of 155 slices shown, 15 images]
[im 12/155  mediastinal]
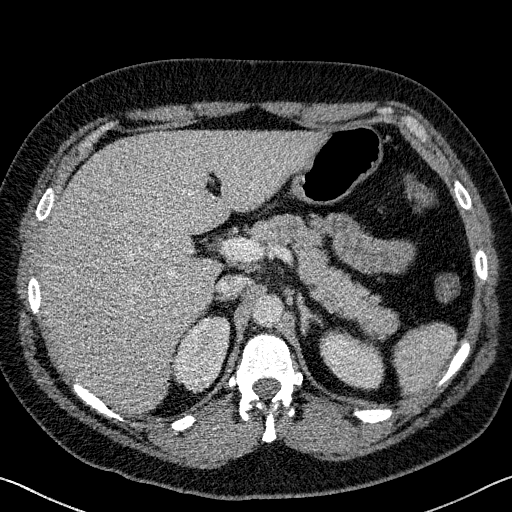
[im 12/155  lung]
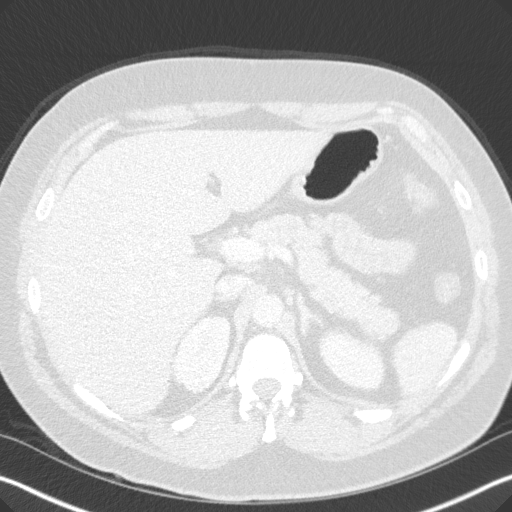
[im 23/155  lung]
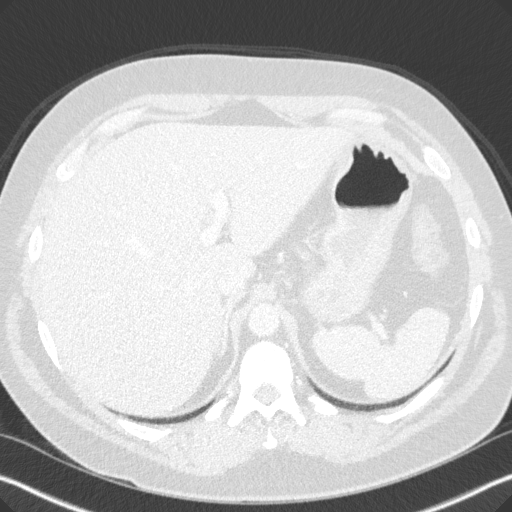
[im 35/155  lung]
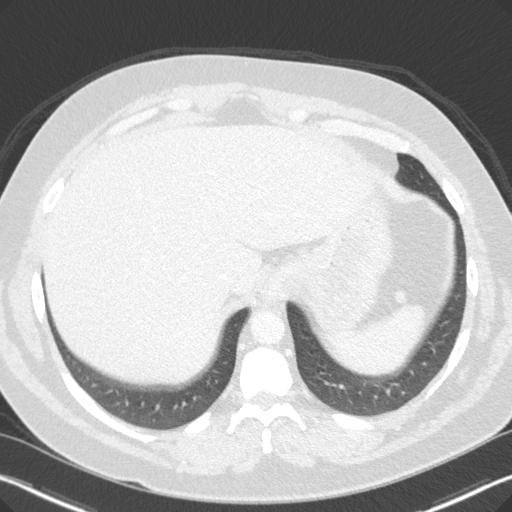
[im 46/155  lung]
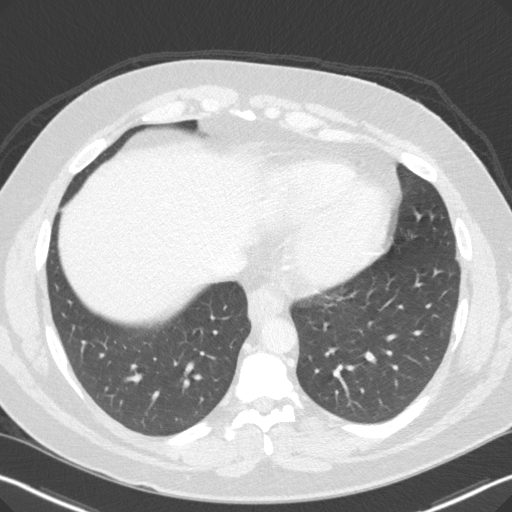
[im 58/155  mediastinal]
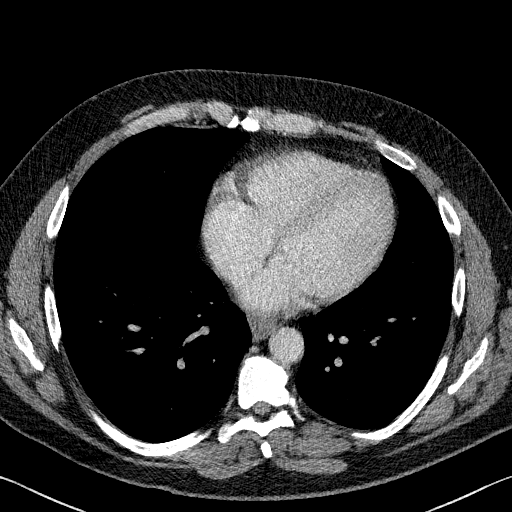
[im 58/155  lung]
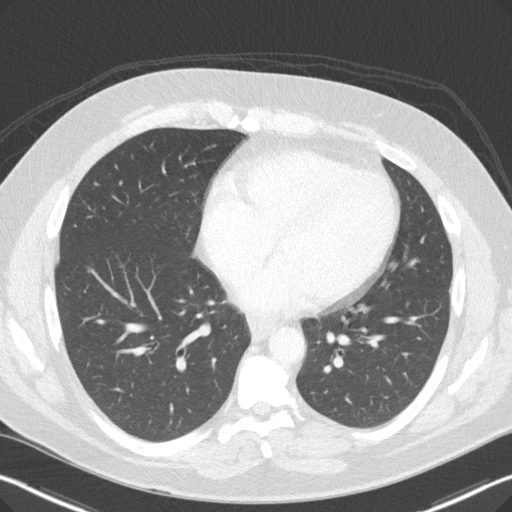
[im 69/155  lung]
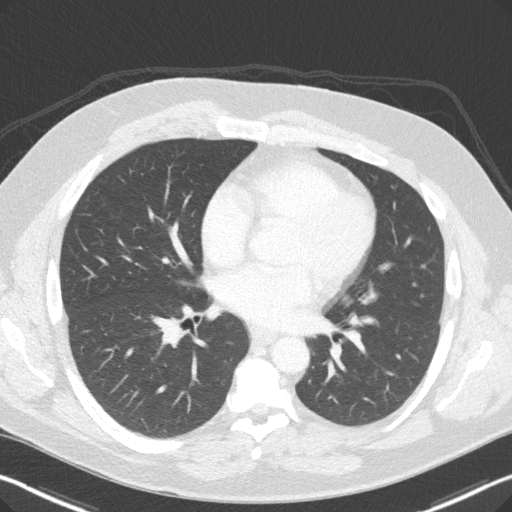
[im 86/155  lung]
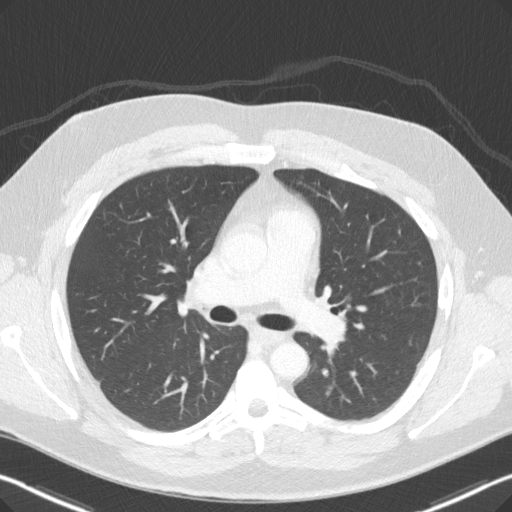
[im 97/155  lung]
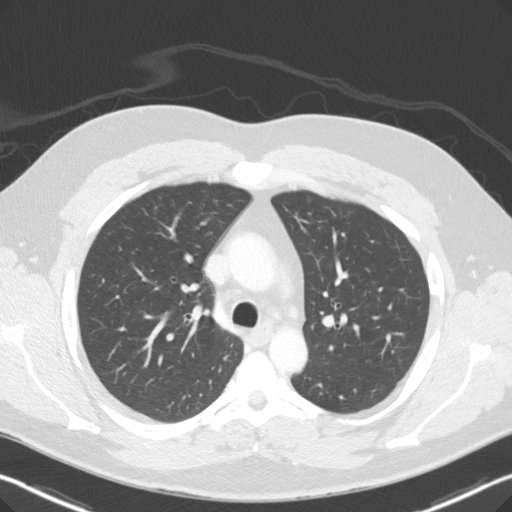
[im 109/155  mediastinal]
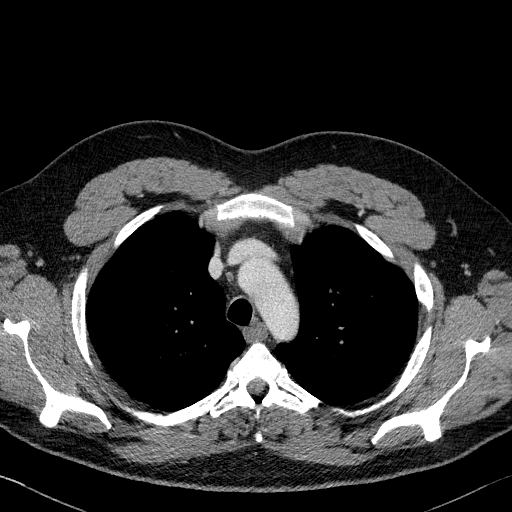
[im 109/155  lung]
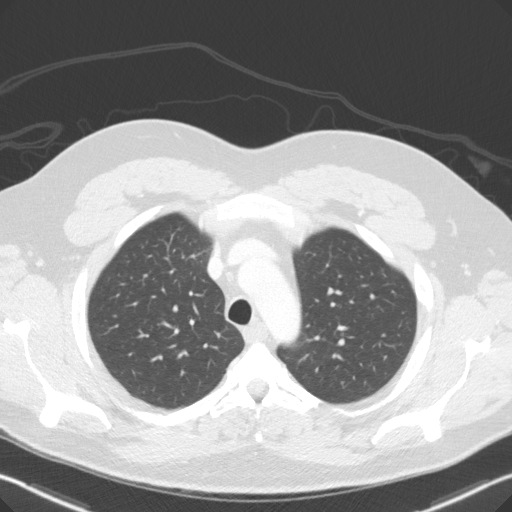
[im 120/155  lung]
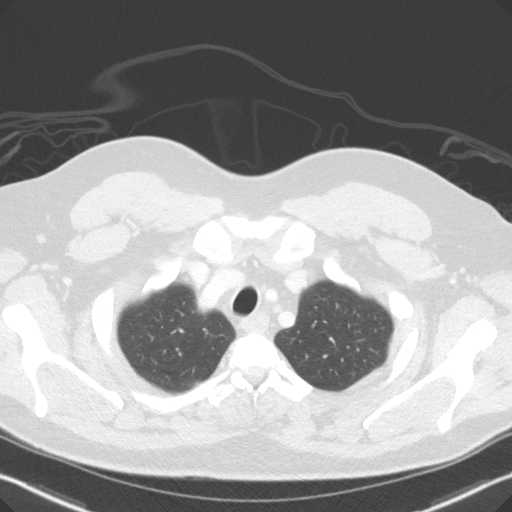
[im 132/155  lung]
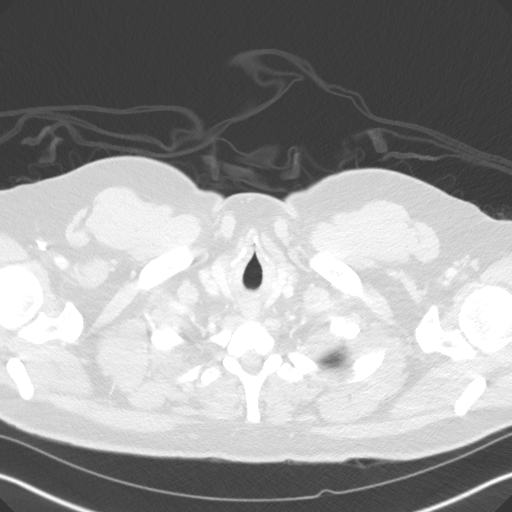
[im 143/155  lung]
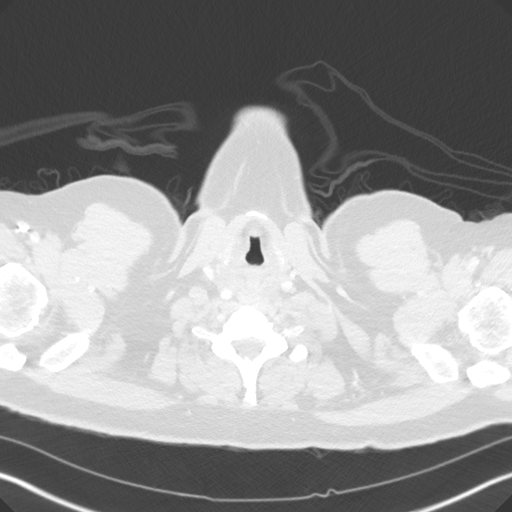

[Series 5: coronal · coronal · 0.64mm/px · 3 of 157 slices shown]
[im 32/157  lung]
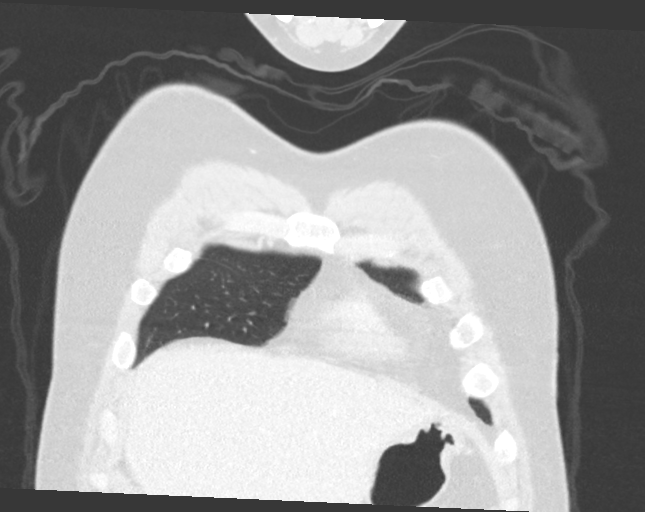
[im 63/157  lung]
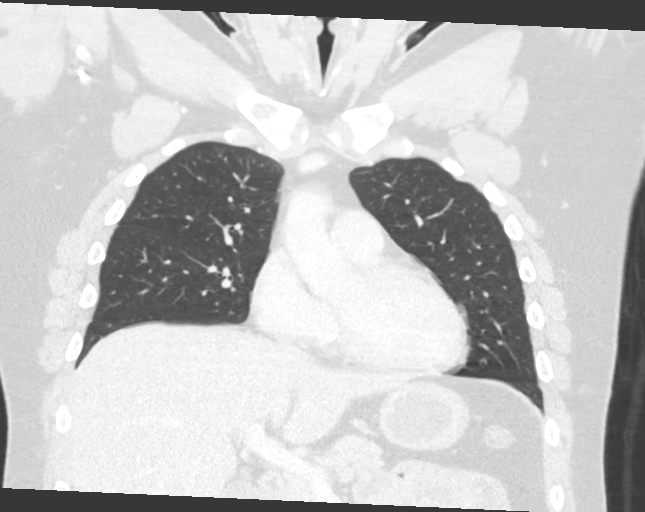
[im 94/157  lung]
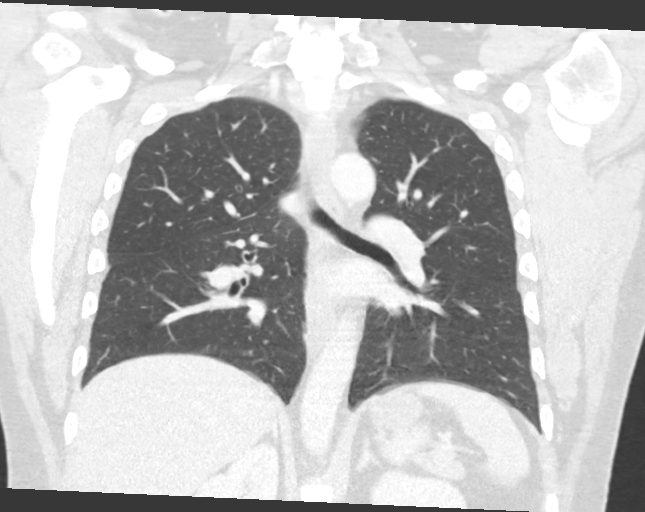

[15 of 36 positions shown; findings below may reference images not displayed]

FINDINGS: Cardiovascular: The heart is normal in size. No pericardial
effusion. The aorta is normal in caliber. No dissection. No
atherosclerotic calcifications. The branch vessels are patent.

The pulmonary arteries appear normal.

Mediastinum/Nodes: No mediastinal or hilar mass or adenopathy. No
hematoma or pneumomediastinum.

The esophagus is grossly normal by CT. No findings for esophageal
injury. No paraesophageal fluid collections or adenopathy. The GE
junction appears normal.

Lungs/Pleura: No acute pulmonary findings. No pleural effusion or
pneumothorax. No worrisome pulmonary lesions.

Upper Abdomen: No significant upper abdominal findings. No free
abdominal fluid collections or free air.

Musculoskeletal: No chest wall lesions, supraclavicular or axillary
adenopathy. The lower neck is unremarkable. No subcutaneous
emphysema. The thyroid gland is unremarkable.

The bony thorax is intact.
IMPRESSION: 1. Unremarkable CT examination of the chest. No findings for
esophageal injury, hematoma or pneumomediastinum.
2. Normal appearance of the heart and great vessels.
3. No acute pulmonary findings or worrisome pulmonary lesions.

## 2020-07-28 IMAGING — RF DG ESOPHAGUS
9 series · 12 of 12 positions shown · non-contrast
Comparison: CT chest 10/04/2019

CLINICAL DATA: Vomiting, hematemesis

EXAM:
ESOPHOGRAM
TECHNIQUE: Single contrast examination was performed using  Gastrografin.
FLUOROSCOPY TIME:  Fluoroscopy Time:  42 seconds
Radiation Exposure Index (if provided by the fluoroscopic device):
14.1 mGy
Number of Acquired Spot Images: 4 full exposures

[Series 1: fluoro_barium 2fps_bw · 0.17mm/px · 1 of 1 slices shown (1 of 4)]
[im 1/1]
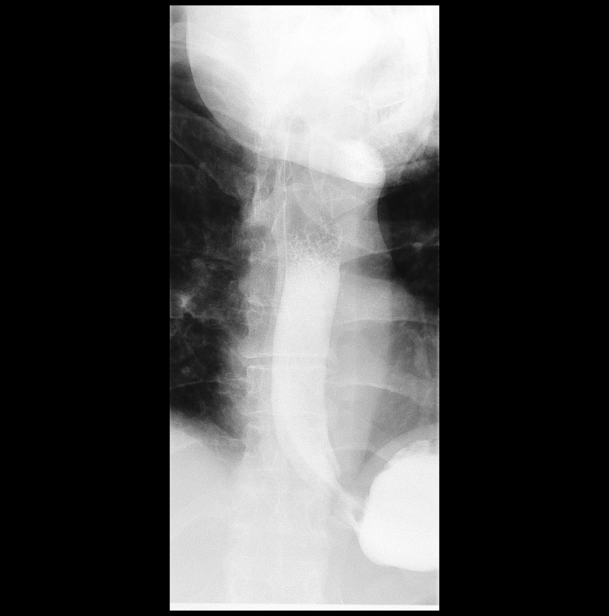

[Series 2: fluoro_barium 2fps_bw · 0.17mm/px · 1 of 1 slices shown (2 of 4)]
[im 1/1]
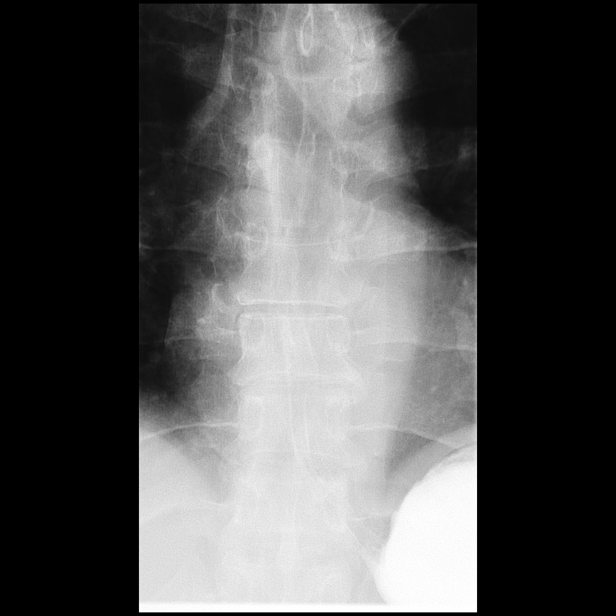

[Series 3: fluoro_barium 2fps_bw · 0.17mm/px · 1 of 1 slices shown (3 of 4)]
[im 1/1]
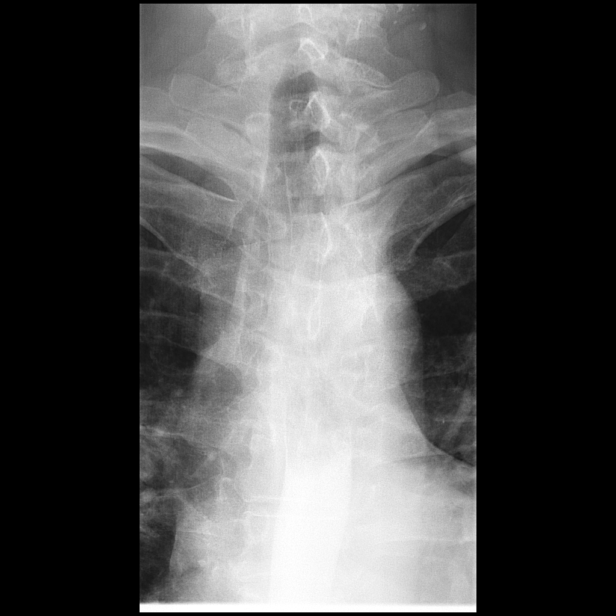

[Series 4: fluoro_barium 2fps_bw · 0.17mm/px · 1 of 1 slices shown (4 of 4)]
[im 1/1]
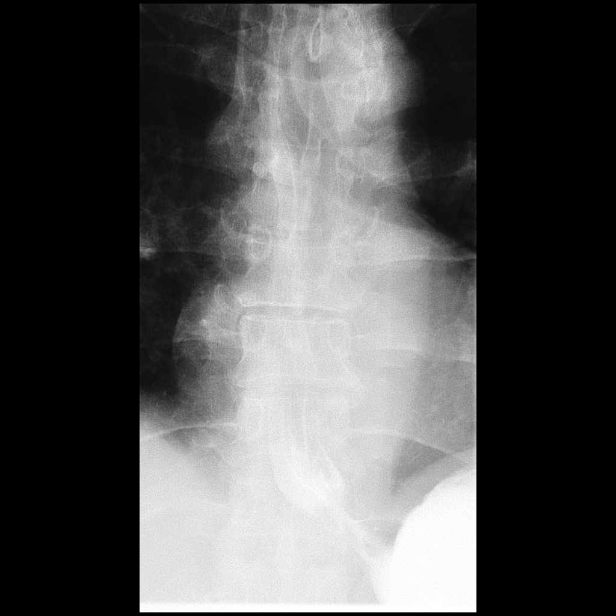

[Series 5: cp_standard · 0.17mm/px · 1 of 1 slices shown (1 of 5)]
[im 1/1]
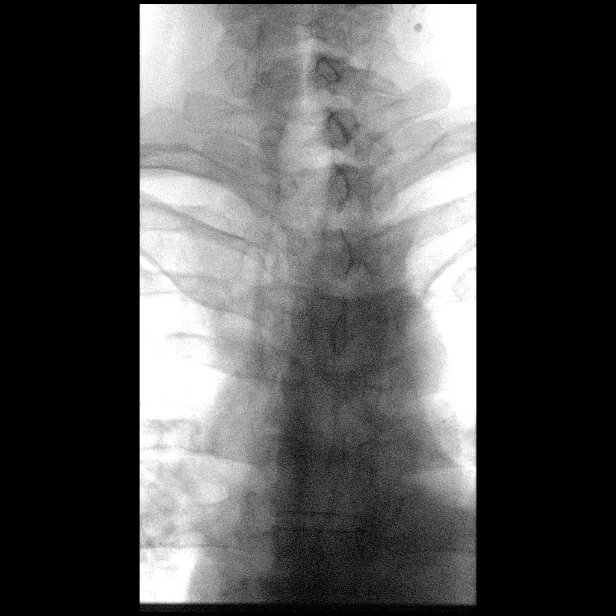

[Series 6: cp_standard · 0.17mm/px · 1 of 1 slices shown (2 of 5)]
[im 1/1]
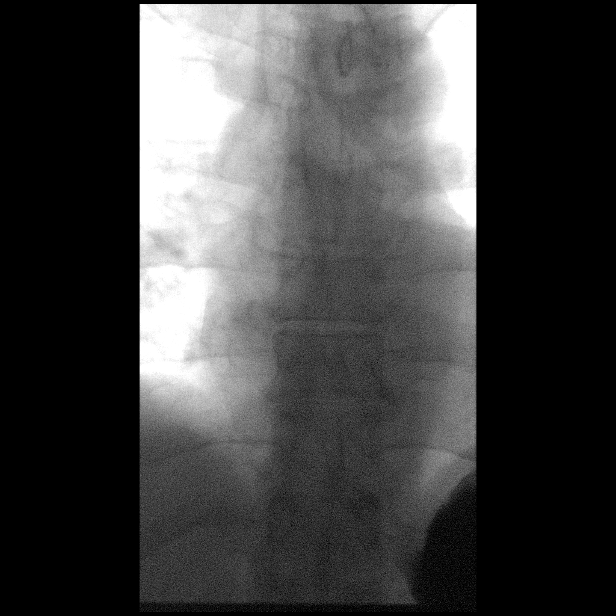

[Series 7: cp_standard · 0.26mm/px · 1 of 1 slices shown (3 of 5)]
[im 1/1]
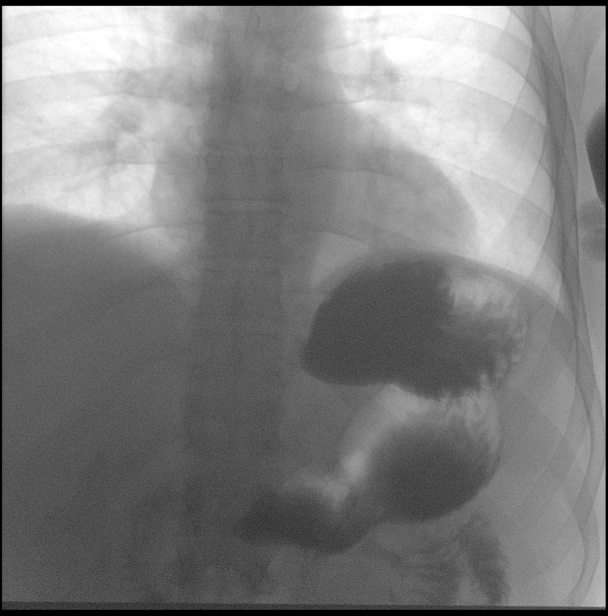

[Series 8: cp_standard · 0.17mm/px · 4 of 19 frames shown (4 of 5)]
[frame 3/19]
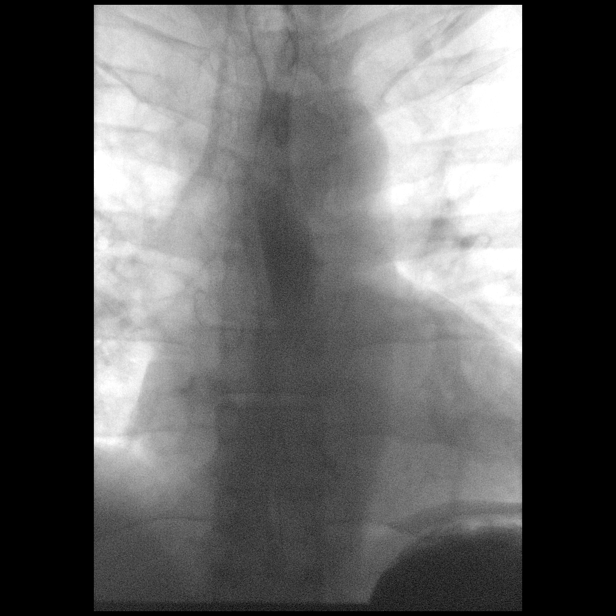
[frame 10/19]
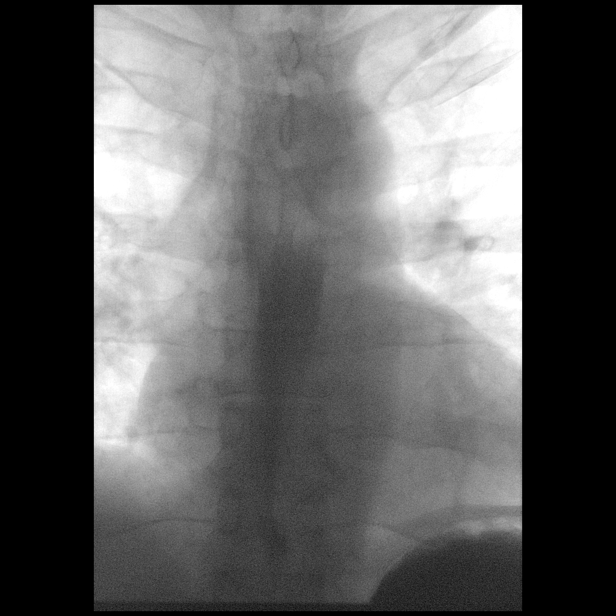
[frame 17/19]
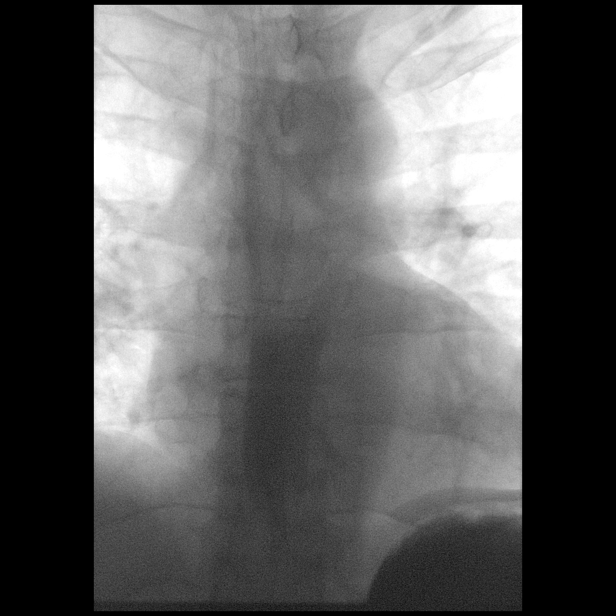
[frame 19/19]
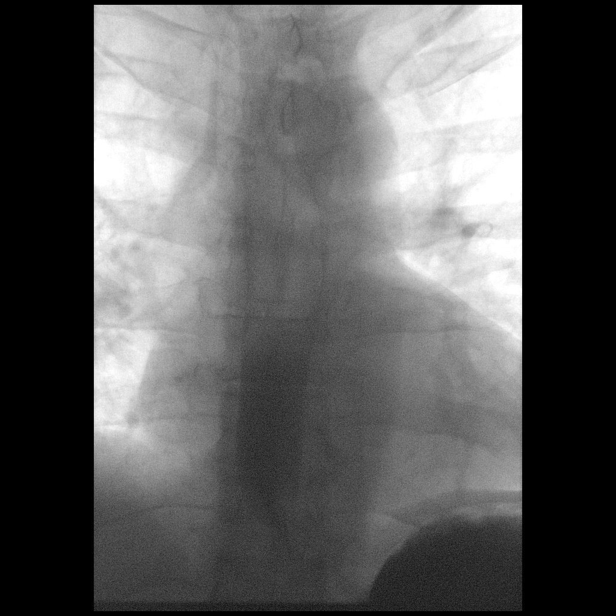

[Series 9: cp_standard · 0.18mm/px · 1 of 1 slices shown (5 of 5)]
[im 1/1]
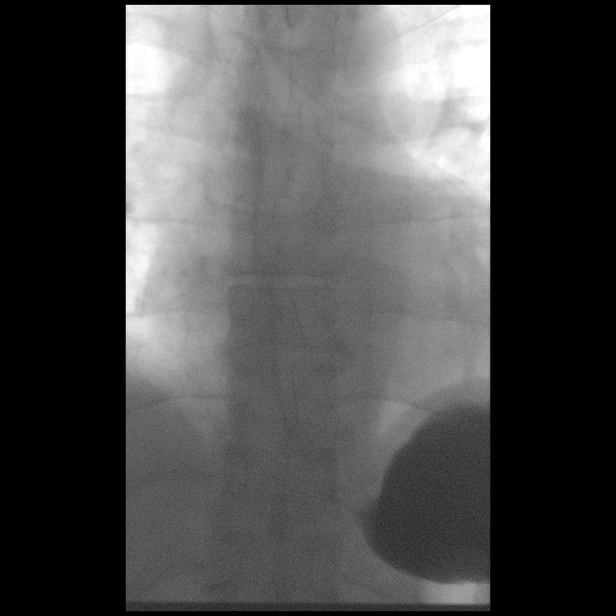

[12 of 12 positions shown; findings below may reference images not displayed]

FINDINGS: Single-contrast esophagram was performed using a [DATE] dilution of
Gastrografin and water. The esophageal motility is within normal
limits. No extraluminal contrast to suggest esophageal perforation
or injury. There is no evidence of stricture, mass or ulceration.
Gastroesophageal junction normally located without hiatal hernia.
IMPRESSION: Negative for esophageal tear/perforation.

## 2020-10-15 ENCOUNTER — Other Ambulatory Visit: Payer: Self-pay

## 2020-10-15 ENCOUNTER — Emergency Department
Admission: EM | Admit: 2020-10-15 | Discharge: 2020-10-15 | Disposition: A | Discharge status: Home / Self Care | Payer: Self-pay | Attending: Emergency Medicine | Admitting: Emergency Medicine

## 2020-10-15 ENCOUNTER — Emergency Department: Payer: Self-pay

## 2020-10-15 DIAGNOSIS — R079 Chest pain, unspecified: Secondary | ICD-10-CM | POA: Insufficient documentation

## 2020-10-15 DIAGNOSIS — Z5321 Procedure and treatment not carried out due to patient leaving prior to being seen by health care provider: Secondary | ICD-10-CM | POA: Insufficient documentation

## 2020-10-15 LAB — CBC
HCT: 43.2 % (ref 39.0–52.0)
Hemoglobin: 14.4 g/dL (ref 13.0–17.0)
MCH: 27.5 pg (ref 26.0–34.0)
MCHC: 33.3 g/dL (ref 30.0–36.0)
MCV: 82.4 fL (ref 80.0–100.0)
Platelets: 319 10*3/uL (ref 150–400)
RBC: 5.24 MIL/uL (ref 4.22–5.81)
RDW: 12.5 % (ref 11.5–15.5)
WBC: 6.9 10*3/uL (ref 4.0–10.5)
nRBC: 0 % (ref 0.0–0.2)

## 2020-10-15 LAB — BASIC METABOLIC PANEL
Anion gap: 8 (ref 5–15)
BUN: 23 mg/dL — ABNORMAL HIGH (ref 6–20)
CO2: 25 mmol/L (ref 22–32)
Calcium: 9.3 mg/dL (ref 8.9–10.3)
Chloride: 103 mmol/L (ref 98–111)
Creatinine, Ser: 0.8 mg/dL (ref 0.61–1.24)
GFR, Estimated: >60 mL/min (ref >60)
Glucose, Bld: 116 mg/dL — ABNORMAL HIGH (ref 70–99)
Potassium: 4.1 mmol/L (ref 3.5–5.1)
Sodium: 136 mmol/L (ref 135–145)

## 2020-10-15 LAB — TROPONIN I (HIGH SENSITIVITY): Troponin I (High Sensitivity): 4 ng/L (ref <18)

## 2020-10-15 NOTE — ED Triage Notes (Signed)
Pt c/o intermittent left sided chest pain for the past year, states he was told by his PCP it is anxiety but has never had a referral to cardiology. States he went to the fire department today and it was 178/105

## 2020-10-16 IMAGING — DX DG CHEST 1V PORT
1 series · 1 of 1 positions shown · non-contrast
Comparison: None.

CLINICAL DATA: Chest pain

EXAM:
PORTABLE CHEST 1 VIEW

[chest ap]
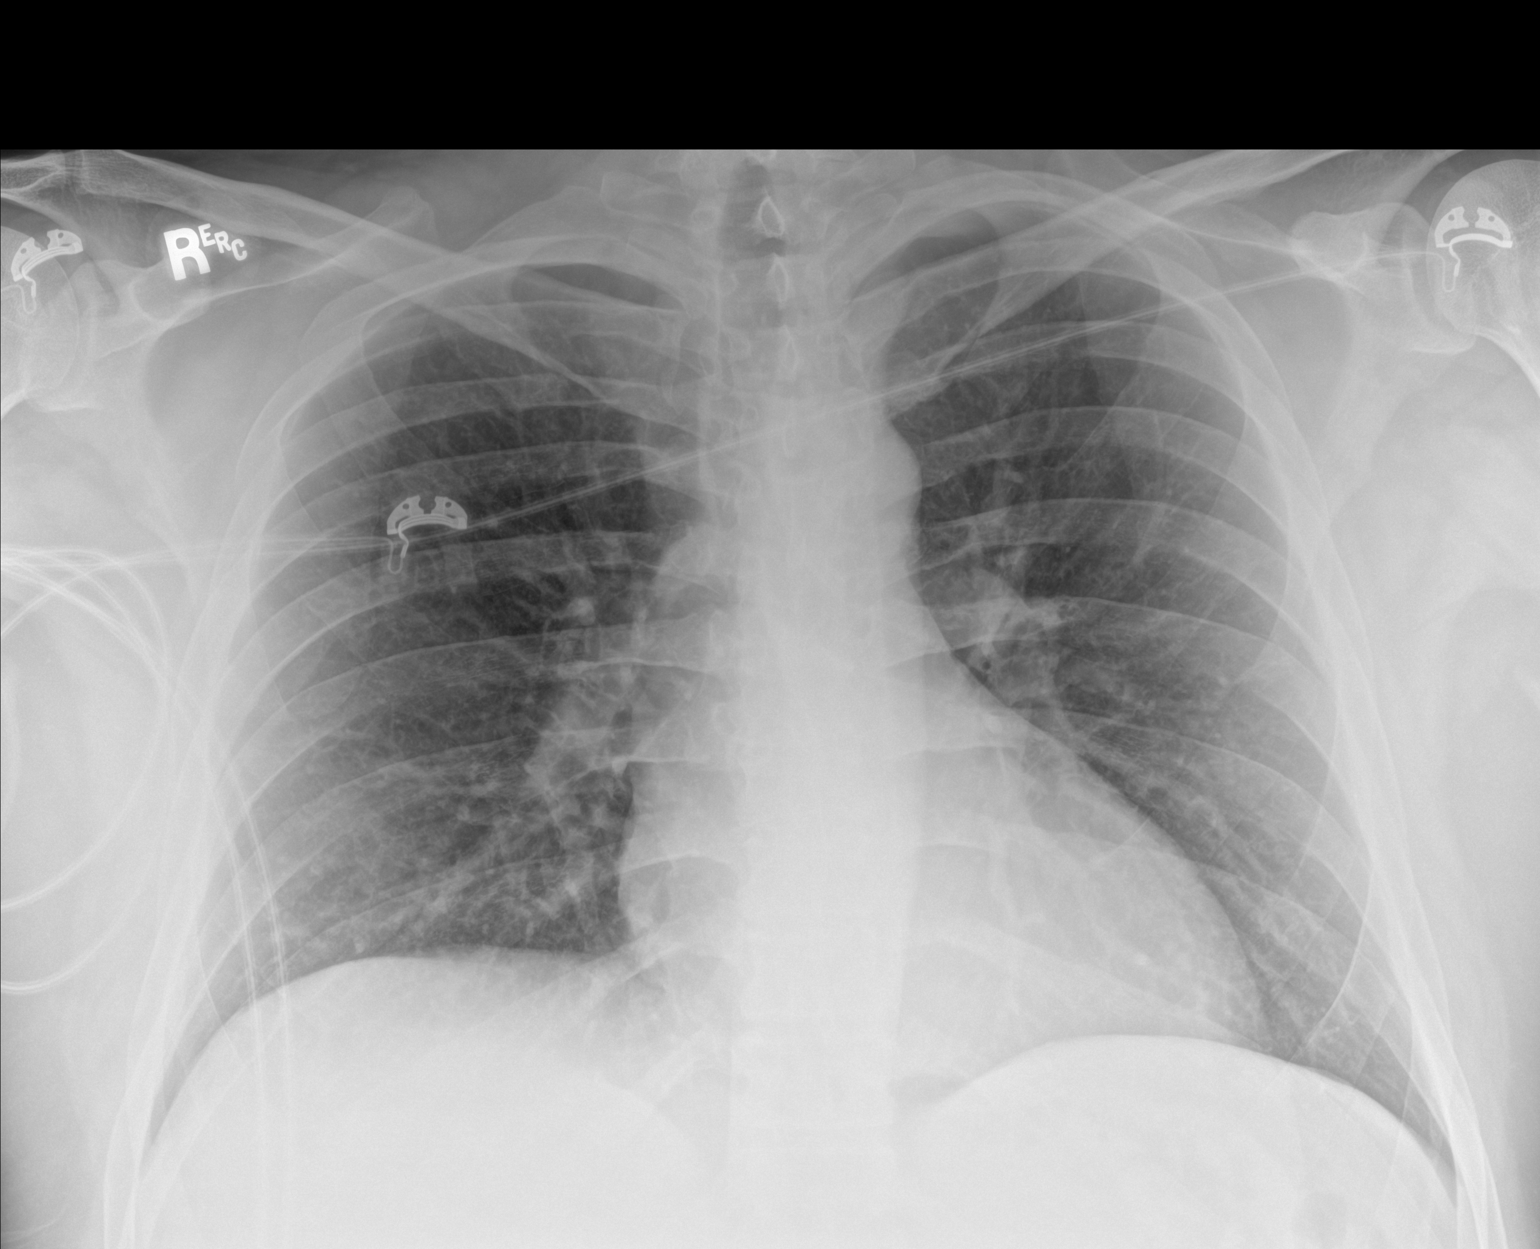

[1 of 1 positions shown; findings below may reference images not displayed]

FINDINGS: The heart size and mediastinal contours are within normal limits.
Both lungs are clear. The visualized skeletal structures are
unremarkable.
IMPRESSION: No active disease.

## 2020-12-01 IMAGING — US US ABDOMEN LIMITED
1 series · 14 of 25 positions shown · non-contrast
Comparison: None.

CLINICAL DATA: Elevated LFTs

EXAM:
ULTRASOUND ABDOMEN LIMITED RIGHT UPPER QUADRANT

[Series 1: us abdomen limited ruq · 14 of 33 slices shown]
[im 1/33]
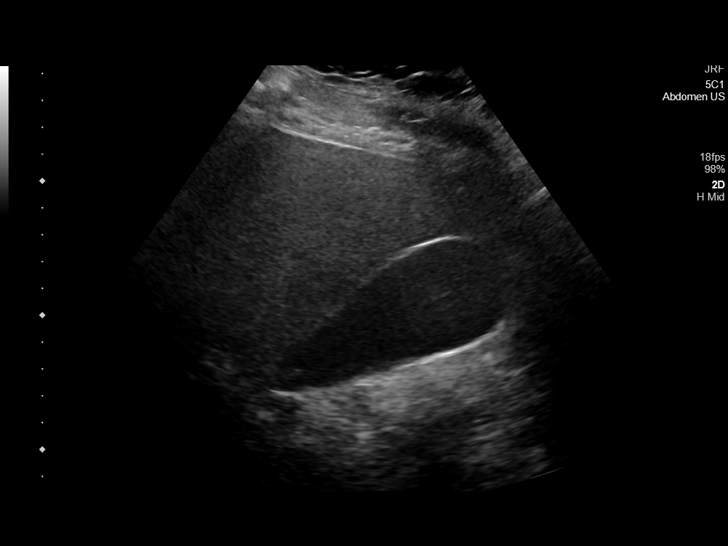
[im 3/33]
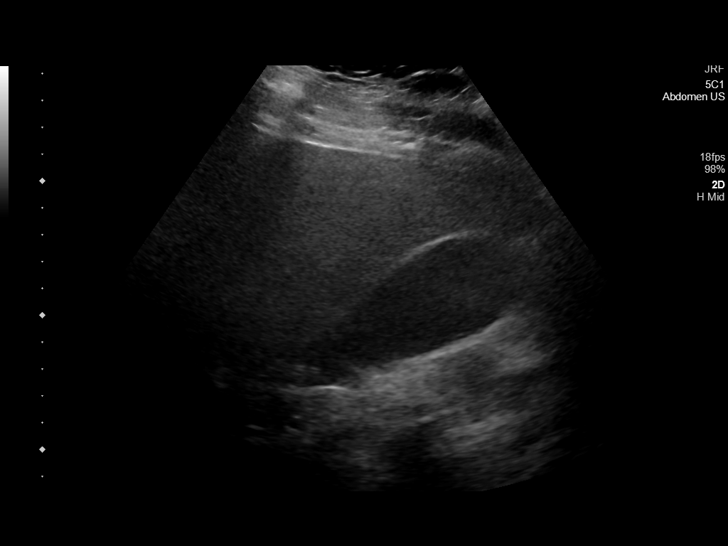
[im 6/33]
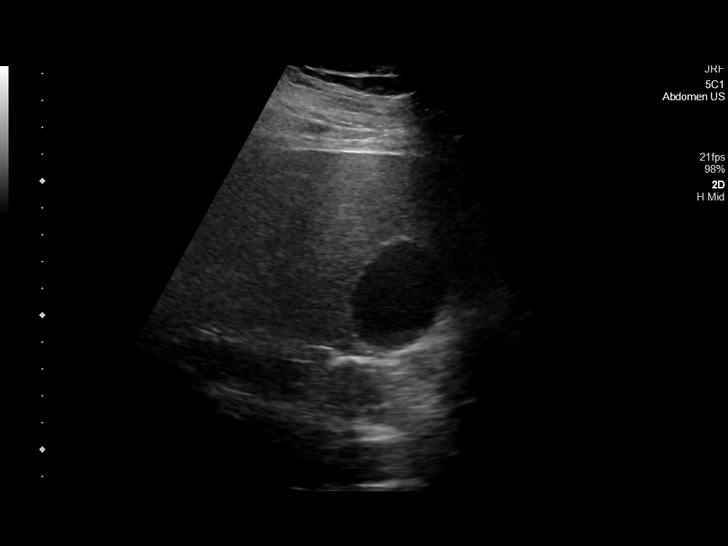
[im 9/33]
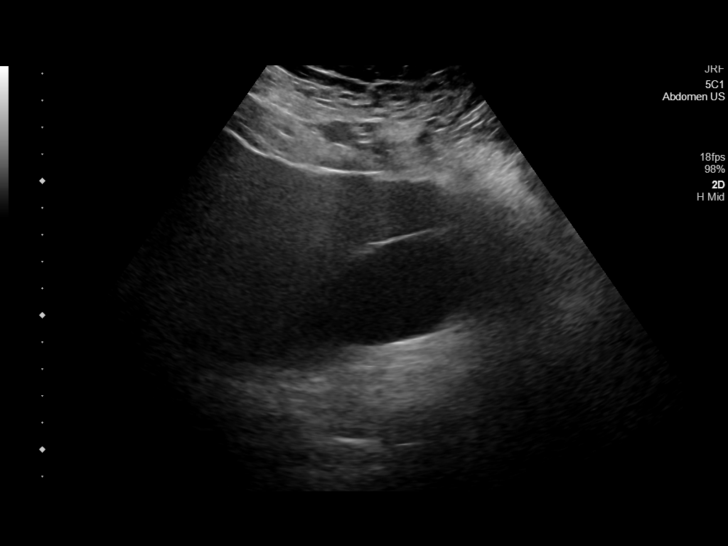
[im 11/33]
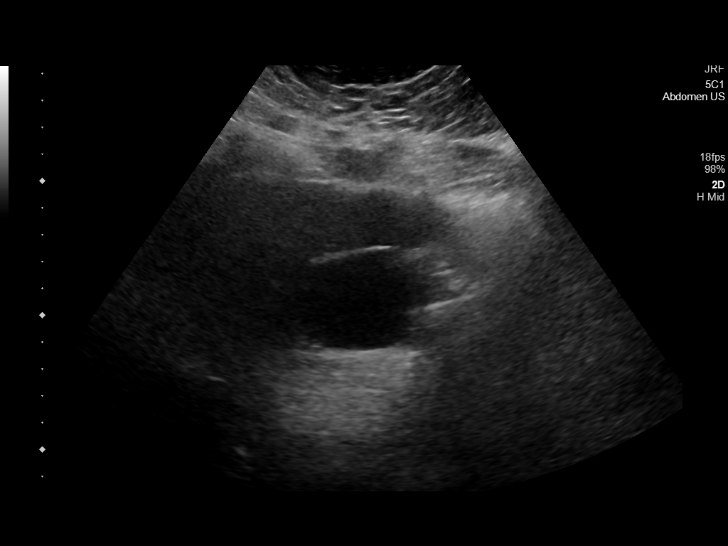
[im 13/33]
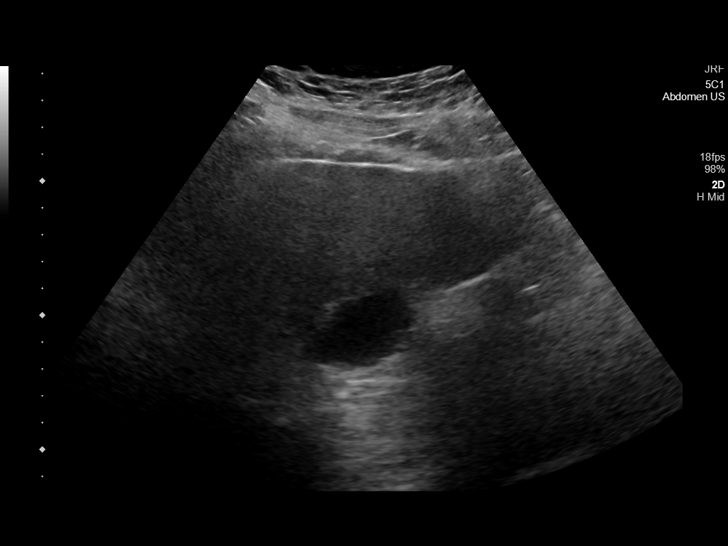
[im 15/33]
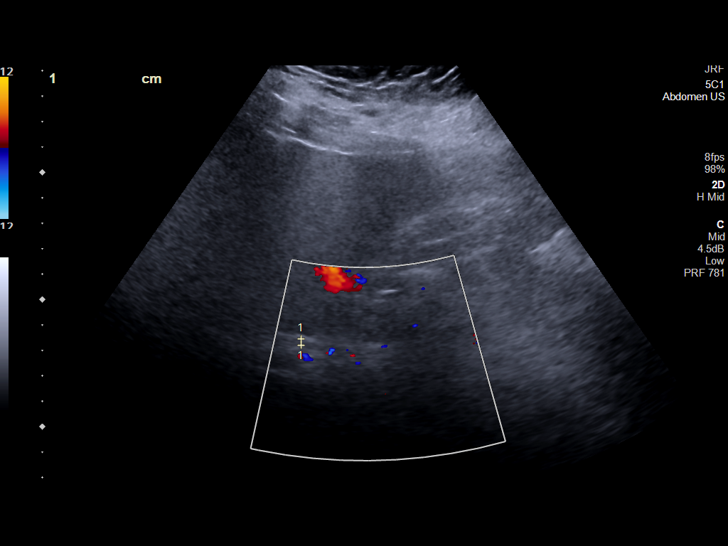
[im 18/33]
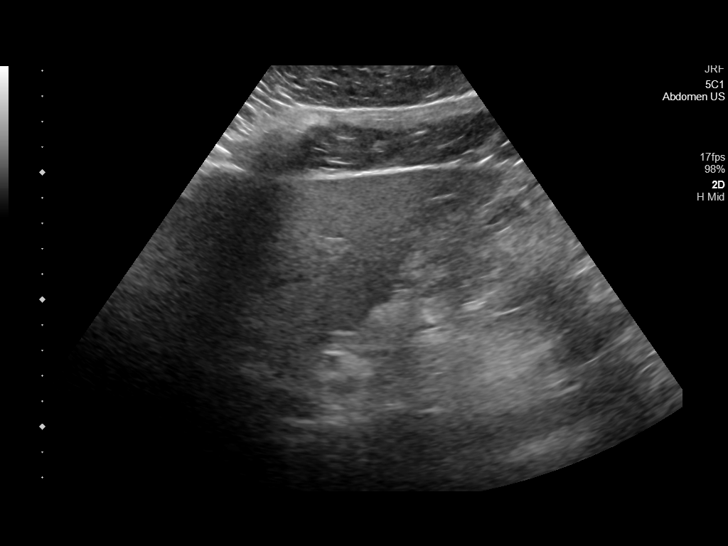
[im 21/33]
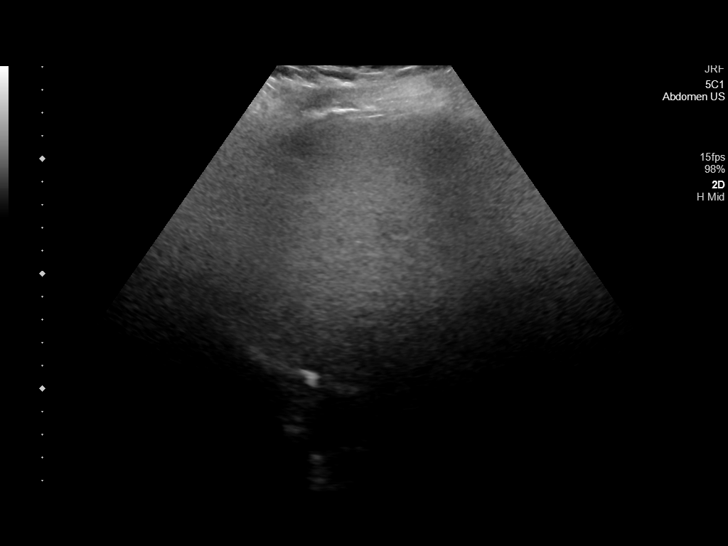
[im 22/33]
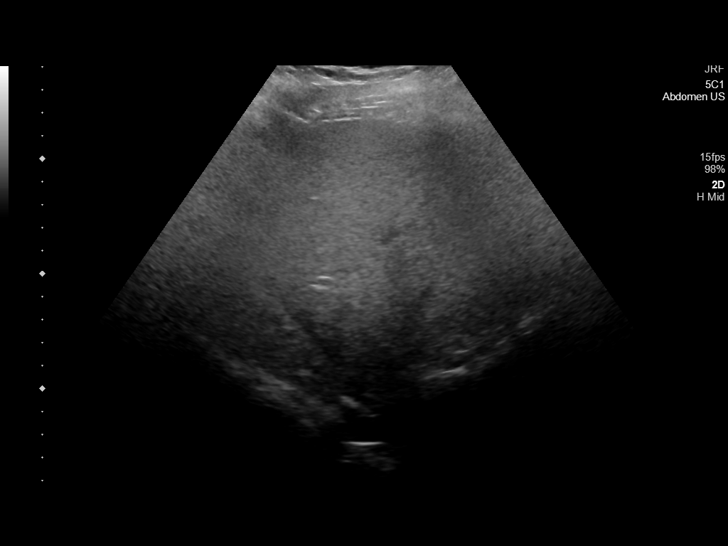
[im 25/33]
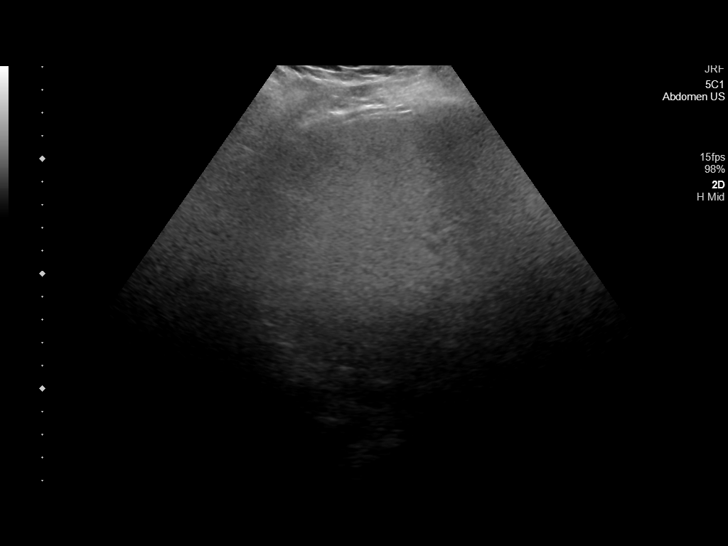
[im 27/33]
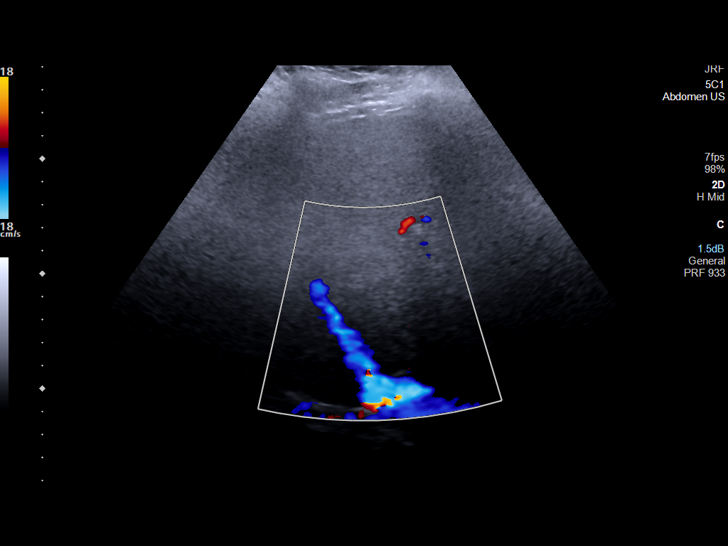
[im 30/33]
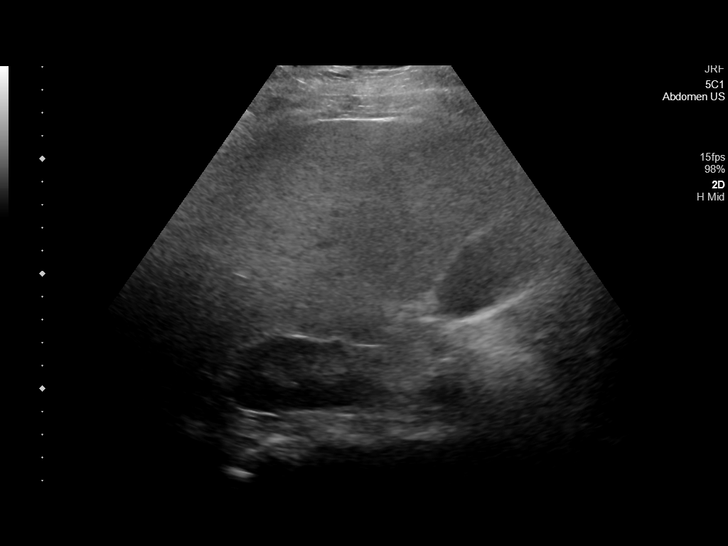
[im 33/33]
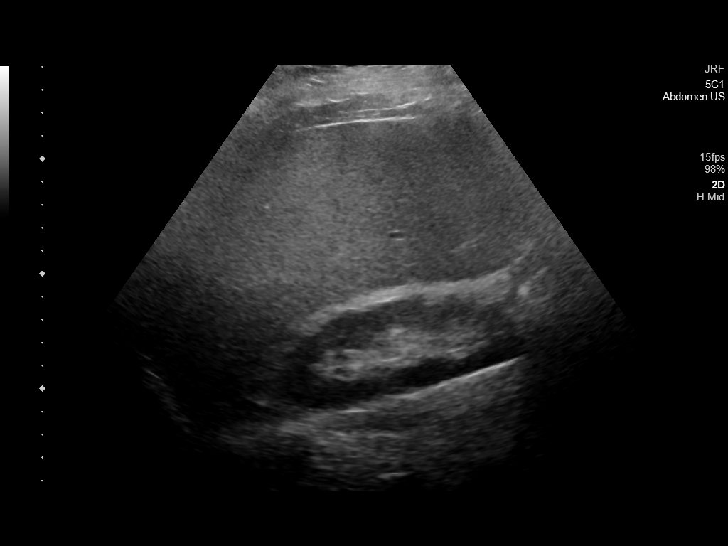

[14 of 25 positions shown; findings below may reference images not displayed]

FINDINGS: Gallbladder:

No gallstones or wall thickening visualized. No sonographic Murphy
sign noted by sonographer.

Common bile duct:

Diameter: Normal caliber, 3 mm

Liver:

Increased echotexture compatible with fatty infiltration. No focal
abnormality or biliary ductal dilatation. Portal vein is patent on
color Doppler imaging with normal direction of blood flow towards
the liver.

Other: None.
IMPRESSION: Fatty infiltration of the liver.

No acute findings.

## 2021-03-04 ENCOUNTER — Other Ambulatory Visit: Payer: Self-pay | Admitting: Orthopedic Surgery

## 2021-03-04 DIAGNOSIS — M25571 Pain in right ankle and joints of right foot: Secondary | ICD-10-CM

## 2021-07-07 ENCOUNTER — Encounter: Payer: Self-pay | Admitting: Emergency Medicine

## 2021-07-07 ENCOUNTER — Emergency Department
Admission: EM | Admit: 2021-07-07 | Discharge: 2021-07-07 | Disposition: A | Discharge status: Home / Self Care | Payer: Self-pay | Attending: Emergency Medicine | Admitting: Emergency Medicine

## 2021-07-07 ENCOUNTER — Other Ambulatory Visit: Payer: Self-pay

## 2021-07-07 DIAGNOSIS — I10 Essential (primary) hypertension: Secondary | ICD-10-CM | POA: Insufficient documentation

## 2021-07-07 DIAGNOSIS — J029 Acute pharyngitis, unspecified: Secondary | ICD-10-CM

## 2021-07-07 DIAGNOSIS — Z79899 Other long term (current) drug therapy: Secondary | ICD-10-CM | POA: Insufficient documentation

## 2021-07-07 DIAGNOSIS — H6983 Other specified disorders of Eustachian tube, bilateral: Secondary | ICD-10-CM | POA: Insufficient documentation

## 2021-07-07 DIAGNOSIS — J039 Acute tonsillitis, unspecified: Secondary | ICD-10-CM | POA: Insufficient documentation

## 2021-07-07 MED ORDER — CLARITHROMYCIN ER 500 MG PO TB24: 500.0000 mg | ORAL_TABLET | Freq: Two times a day (BID) | ORAL | 0 refills | Status: AC

## 2021-07-07 MED ORDER — CLARITHROMYCIN 500 MG PO TABS
500.0000 mg | ORAL_TABLET | Freq: Two times a day (BID) | ORAL | Status: DC
  Administered 2021-07-07: 02:00:00 500 mg via ORAL
  Filled 2021-07-07 (×2): qty 1

## 2021-07-07 MED ORDER — MAGIC MOUTHWASH
10.0000 mL | Freq: Once | ORAL | Status: AC
  Administered 2021-07-07: 10 mL via ORAL
  Filled 2021-07-07: qty 10

## 2021-07-07 MED ORDER — DEXAMETHASONE 1 MG/ML PO CONC
10.0000 mg | Freq: Once | ORAL | Status: AC
  Administered 2021-07-07: 02:00:00 10 mg via ORAL
  Filled 2021-07-07: qty 10

## 2021-07-07 MED ORDER — MAGIC MOUTHWASH: ORAL | 0 refills | Status: DC

## 2021-07-07 NOTE — ED Provider Notes (Signed)
[facility] Provider Note    Event Date/Time   First MD Initiated Contact with Patient 07/07/21 0114     (approximate)   History   Nasal Congestion   HPI  Zachary L Bromwell Montez Hageman. is a 31 y.o. male who presents to the ED from home with a chief complaint of sore throat, fever, sinus drainage.  Wife recently diagnosed with strep throat.  Last took ibuprofen 400 mg at approximately 11 PM.  Denies cough, chest pain, shortness of breath, abdominal pain, nausea, vomiting or dizziness.     Past Medical History   Past Medical History:  Diagnosis Date   Hypertension    Panic attacks      Active Problem List   Patient Active Problem List   Diagnosis Date Noted   Acute alcoholic hepatitis 02/08/2020   Alcohol dependence with uncomplicated withdrawal (HCC) 02/08/2020   Gastritis 02/08/2020   Leukocytosis 10/04/2019   Abnormal LFTs 10/04/2019   Hematemesis 10/04/2019   Nausea vomiting and diarrhea 10/04/2019   Hypertension    Tobacco abuse    Abdominal pain    Chest pain    Esophagitis    Alcohol abuse 03/29/2019   Severe anxiety with panic 03/28/2019   Alcoholic intoxication with complication [facility])      Past Surgical History   Past Surgical History:  Procedure Laterality Date   ESOPHAGOGASTRODUODENOSCOPY (EGD) WITH PROPOFOL N/A 10/04/2019   Procedure: ESOPHAGOGASTRODUODENOSCOPY (EGD) WITH PROPOFOL;  Surgeon: Midge Minium, MD;  Location: [facility] ENDOSCOPY;  Service: Endoscopy;  Laterality: N/A;     Home Medications   Prior to Admission medications   Medication Sig Start Date End Date Taking? Authorizing Provider  clarithromycin (BIAXIN XL) 500 MG 24 hr tablet Take 1 tablet (500 mg total) by mouth 2 (two) times daily for 7 days. 07/07/21 07/14/21 Yes Irean Hong, MD  magic mouthwash SOLN 87mL Anbesol 63mL Benadryl 51mL Mylanta  1mL swish, gargle & spit q8hr prn throat discomfort 07/07/21  Yes Irean Hong, MD  acetaminophen  (TYLENOL) 500 MG tablet Take 500 mg by mouth every 6 (six) hours as needed.    [provider]  methocarbamol (ROBAXIN) 500 MG tablet Take 500 mg by mouth every 6 (six) hours as needed for muscle spasms.    [provider]  oxyCODONE-acetaminophen (PERCOCET) 5-325 MG tablet Take 1-2 tablets by mouth every 4 (four) hours as needed for severe pain. 07/03/20   Fayrene Helper, PA-C  oxyCODONE-acetaminophen (PERCOCET/ROXICET) 5-325 MG tablet Take 2 tablets by mouth every 4 (four) hours as needed for severe pain. 07/03/20   Fayrene Helper, PA-C     Allergies  Penicillins   Family History   Family History  Problem Relation Age of Onset   Hypertension Mother      Physical Exam  Triage Vital Signs: ED Triage Vitals  Enc Vitals Group     BP 07/07/21 0111 139/89     Pulse Rate 07/07/21 0111 100     Resp 07/07/21 0111 20     Temp 07/07/21 0111 99.2 F (37.3 C)     Temp Source 07/07/21 0111 Oral     SpO2 07/07/21 0111 97 %     Weight 07/07/21 0111 270 lb (122.5 kg)     Height 07/07/21 0111 5\' 11"  (1.803 m)     Head Circumference --      Peak Flow --      Pain Score 07/07/21 0111 3     Pain Loc --  Pain Edu? --      Excl. in [place]? --     Updated Vital Signs: BP 139/89 (BP Location: Left Arm)    Pulse 100    Temp 99.2 F (37.3 C) (Oral)    Resp 20    Ht 5\' 11"  (1.803 m)    Wt 122.5 kg    SpO2 97%    BMI 37.66 kg/m    General: Awake, no distress.  CV:  Good peripheral perfusion.  Resp:  Normal effort.  CTAB. Abd:  No distention.  Other:  Oropharynx moderately erythematous with mild and symmetrical bilateral tonsillar swelling with exudates.  There is no peritonsillar abscess.  There is no hoarse or muffled voice.  There is no drooling.  Bilateral TMs dull with fluid without rupture.  No lymphadenopathy.  No rash.   ED Results / Procedures / Treatments  Labs (all labs ordered are listed, but only abnormal results are displayed) Labs Reviewed - No data to  display   EKG  None   RADIOLOGY None   Official radiology report(s): No results found.   PROCEDURES:  Critical Care performed: No  Procedures   MEDICATIONS ORDERED IN ED: Medications  dexamethasone (DECADRON) 1 MG/ML solution 10 mg (has no administration in time range)  magic mouthwash (has no administration in time range)  clarithromycin (BIAXIN) tablet 500 mg (has no administration in time range)     IMPRESSION / MDM / ASSESSMENT AND PLAN / ED COURSE  I reviewed the triage vital signs and the nursing notes.                             31 year old male presenting with sore throat, fever, sinus drainage.  Will treat with Decadron which will help both tonsillar swelling as well as eustachian tube dysfunction, Magic mouthwash for throat discomfort, Clarithromycin as patient is penicillin allergic.  He will follow-up with his PCP as needed.  Strict return precautions given.  Patient verbalizes understanding and agrees with plan of care.   FINAL CLINICAL IMPRESSION(S) / ED DIAGNOSES   Final diagnoses:  Tonsillitis  Dysfunction of both eustachian tubes  Sore throat     Rx / DC Orders   ED Discharge Orders          Ordered    clarithromycin (BIAXIN XL) 500 MG 24 hr tablet  2 times daily        07/07/21 0130    magic mouthwash SOLN        07/07/21 0130             Note:  This document was prepared using Dragon voice recognition software and may include unintentional dictation errors.   Paulette Blanch, MD 07/07/21 [phone]

## 2021-07-07 NOTE — ED Triage Notes (Signed)
Patient ambulatory to triage with steady gait, without difficulty or distress noted; pt reports sinus drainage, fever and sore throat x 2 days; wife recently dx with strep; 400mg  ibuprofen at 11pm

## 2021-07-07 NOTE — Discharge Instructions (Signed)
1.  Take antibiotic as prescribed (Clarithromycin 500mg  twice daily x7 days). 2.  You may take Magic mouthwash as needed for throat discomfort. 3.  Continue to alternate Tylenol and Ibuprofen as needed for fever greater than 100.4 F. 4.  Return to the ER for worsening symptoms, persistent vomiting, difficulty breathing or other concerns.

## 2021-08-01 ENCOUNTER — Encounter: Payer: Self-pay | Admitting: Emergency Medicine

## 2021-08-01 ENCOUNTER — Other Ambulatory Visit: Payer: Self-pay

## 2021-08-01 ENCOUNTER — Emergency Department
Admission: EM | Admit: 2021-08-01 | Discharge: 2021-08-01 | Disposition: A | Discharge status: Home / Self Care | Payer: Self-pay | Attending: Emergency Medicine | Admitting: Emergency Medicine

## 2021-08-01 DIAGNOSIS — R112 Nausea with vomiting, unspecified: Secondary | ICD-10-CM | POA: Insufficient documentation

## 2021-08-01 DIAGNOSIS — R1084 Generalized abdominal pain: Secondary | ICD-10-CM | POA: Insufficient documentation

## 2021-08-01 DIAGNOSIS — I10 Essential (primary) hypertension: Secondary | ICD-10-CM | POA: Insufficient documentation

## 2021-08-01 DIAGNOSIS — R197 Diarrhea, unspecified: Secondary | ICD-10-CM | POA: Insufficient documentation

## 2021-08-01 LAB — COMPREHENSIVE METABOLIC PANEL
ALT: 22 U/L (ref 0–44)
AST: 19 U/L (ref 15–41)
Albumin: 3.9 g/dL (ref 3.5–5.0)
Alkaline Phosphatase: 64 U/L (ref 38–126)
Anion gap: 8 (ref 5–15)
BUN: 21 mg/dL — ABNORMAL HIGH (ref 6–20)
CO2: 26 mmol/L (ref 22–32)
Calcium: 8.7 mg/dL — ABNORMAL LOW (ref 8.9–10.3)
Chloride: 101 mmol/L (ref 98–111)
Creatinine, Ser: 0.88 mg/dL (ref 0.61–1.24)
GFR, Estimated: >60 mL/min (ref >60)
Glucose, Bld: 112 mg/dL — ABNORMAL HIGH (ref 70–99)
Potassium: 4.1 mmol/L (ref 3.5–5.1)
Sodium: 135 mmol/L (ref 135–145)
Total Bilirubin: 0.4 mg/dL (ref 0.3–1.2)
Total Protein: 7 g/dL (ref 6.5–8.1)

## 2021-08-01 LAB — URINALYSIS, ROUTINE W REFLEX MICROSCOPIC
Bilirubin Urine: NEGATIVE
Glucose, UA: NEGATIVE mg/dL
Hgb urine dipstick: NEGATIVE
Ketones, ur: NEGATIVE mg/dL
Leukocytes,Ua: NEGATIVE
Nitrite: NEGATIVE
Protein, ur: NEGATIVE mg/dL
Specific Gravity, Urine: 1.03 (ref 1.005–1.030)
pH: 5 (ref 5.0–8.0)

## 2021-08-01 LAB — CBC
HCT: 43.7 % (ref 39.0–52.0)
Hemoglobin: 14 g/dL (ref 13.0–17.0)
MCH: 27.4 pg (ref 26.0–34.0)
MCHC: 32 g/dL (ref 30.0–36.0)
MCV: 85.5 fL (ref 80.0–100.0)
Platelets: 308 10*3/uL (ref 150–400)
RBC: 5.11 MIL/uL (ref 4.22–5.81)
RDW: 12.8 % (ref 11.5–15.5)
WBC: 4.6 10*3/uL (ref 4.0–10.5)
nRBC: 0 % (ref 0.0–0.2)

## 2021-08-01 LAB — LIPASE, BLOOD: Lipase: 31 U/L (ref 11–51)

## 2021-08-01 MED ORDER — ONDANSETRON HCL 4 MG PO TABS: 4.0000 mg | ORAL_TABLET | Freq: Every day | ORAL | 0 refills | Status: AC | PRN

## 2021-08-01 MED ORDER — FAMOTIDINE 20 MG PO TABS
20.0000 mg | ORAL_TABLET | Freq: Once | ORAL | Status: AC
  Administered 2021-08-01: 20 mg via ORAL
  Filled 2021-08-01: qty 1

## 2021-08-01 MED ORDER — ONDANSETRON 4 MG PO TBDP
4.0000 mg | ORAL_TABLET | Freq: Once | ORAL | Status: AC
  Administered 2021-08-01: 4 mg via ORAL
  Filled 2021-08-01: qty 1

## 2021-08-01 NOTE — ED Notes (Signed)
Patient is requesting a work note because he has been home sick the last couple of days and his work needs one.

## 2021-08-01 NOTE — ED Notes (Signed)
Discharge instructions provided to patient along with script info and work note. Patient had no complaints at time of discharge. Patient ambulated out to the waiting room with a steady gait.

## 2021-08-01 NOTE — ED Triage Notes (Signed)
Pt to ED via POV with c/o N/V that started approx 4 days ago. Pt states vomited last night and had a syncopal episode with emesis. Pt A&O x4, NAD noted on arrival to triage desk.

## 2021-08-01 NOTE — ED Notes (Signed)
Patient states that he has had N/V and diarrhea x 4 days. Patient states that he has not vomited in 3 hrs and has been able to drink something since then without vomiting. His children have both had the same illness and got better within a day and a half.

## 2021-08-01 NOTE — ED Provider Notes (Signed)
[facility] Provider Note    Event Date/Time   First MD Initiated Contact with Patient 08/01/21 2114     (approximate)   History   Emesis and Loss of Consciousness   HPI  Zachary L Encarnacion Montez Hageman. is a 31 y.o. male with past medical history of hypertension and panic attacks who presents with nausea and vomiting and diarrhea.  Symptoms started about 4 days ago.  He has had multiple episodes of nonbloody nonbilious emesis as well as diarrhea.  Has had some generalized abdominal discomfort but this is improving.  Yesterday when he was vomiting he had a syncopal episode.  He was having difficulty catching his breath and then passed out.  Did not hit his head.  He does not have chest pain or shortness of breath.  Has not vomited in about 3 hours and has tolerated p.o.  Denies cough congestion or rhinorrhea.  Had a fever of 101 at home.  Patient's family members have similar illness.    Past Medical History:  Diagnosis Date   Hypertension    Panic attacks     Patient Active Problem List   Diagnosis Date Noted   Acute alcoholic hepatitis 02/08/2020   Alcohol dependence with uncomplicated withdrawal (HCC) 02/08/2020   Gastritis 02/08/2020   Leukocytosis 10/04/2019   Abnormal LFTs 10/04/2019   Hematemesis 10/04/2019   Nausea vomiting and diarrhea 10/04/2019   Hypertension    Tobacco abuse    Abdominal pain    Chest pain    Esophagitis    Alcohol abuse 03/29/2019   Severe anxiety with panic 03/28/2019   Alcoholic intoxication with complication [facility])      Physical Exam  Triage Vital Signs: ED Triage Vitals  Enc Vitals Group     BP 08/01/21 1937 (!) 138/103     Pulse Rate 08/01/21 1937 95     Resp 08/01/21 1937 17     Temp 08/01/21 1937 97.8 F (36.6 C)     Temp Source 08/01/21 1937 Oral     SpO2 08/01/21 1937 99 %     Weight --      Height --      Head Circumference --      Peak Flow --      Pain Score 08/01/21 1929 2     Pain Loc --      Pain Edu?  --      Excl. in [place]? --     Most recent vital signs: Vitals:   08/01/21 1937  BP: (!) 138/103  Pulse: 95  Resp: 17  Temp: 97.8 F (36.6 C)  SpO2: 99%     General: Awake, no distress.  CV:  Good peripheral perfusion.  Resp:  Normal effort.  Abd:  No distention.  Soft and nontender throughout Neuro:             Awake, Alert, Oriented x 3  Other:     ED Results / Procedures / Treatments  Labs (all labs ordered are listed, but only abnormal results are displayed) Labs Reviewed  COMPREHENSIVE METABOLIC PANEL - Abnormal; Notable for the following components:      Result Value   Glucose, Bld 112 (*)    BUN 21 (*)    Calcium 8.7 (*)    All other components within normal limits  URINALYSIS, ROUTINE W REFLEX MICROSCOPIC - Abnormal; Notable for the following components:   Color, Urine YELLOW (*)    APPearance CLEAR (*)    All  other components within normal limits  LIPASE, BLOOD  CBC     EKG  EKG interpretation performed by myself: NSR, nml axis, nml intervals, no acute ischemic changes    RADIOLOGY    PROCEDURES:  Critical Care performed: No  .1-3 Lead EKG Interpretation Performed by: Georga Hacking, MD Authorized by: Georga Hacking, MD     Interpretation: normal     ECG rate assessment: normal     Ectopy: none     Conduction: normal    The patient is on the cardiac monitor to evaluate for evidence of arrhythmia and/or significant heart rate changes.   MEDICATIONS ORDERED IN ED: Medications  ondansetron (ZOFRAN-ODT) disintegrating tablet 4 mg (has no administration in time range)  famotidine (PEPCID) tablet 20 mg (has no administration in time range)     IMPRESSION / MDM / ASSESSMENT AND PLAN / ED COURSE  I reviewed the triage vital signs and the nursing notes.                              Differential diagnosis includes, but is not limited to, viral gastroenteritis, dehydration, metabolic abnormality, vasovagal, orthostatic  Is a  31 year old male presenting with nausea vomiting diarrhea x4 days as well as a syncopal episode that occurred yesterday.  Has had none bilious nonbloody emesis with some generalized abdominal discomfort.  Does not have any abdominal pain currently.  Yesterday while vomiting apparently had a syncopal episode.  Has not had chest pain shortness of breath or other red flag symptoms.  Patient's vital signs within normal limits he is not tachycardic nor is he hypotensive.  Abdomen is soft nontender throughout.  Reviewed his labs which are overall reassuring, normal see CBC and CMP, UA without ketones.  Lipase within normal limits, no LFT abnormalities.  Will give Zofran and Pepcid and p.o. challenge.  Suspect viral gastroenteritis especially because multiple family members have similar illness.     Pt feeling improved after meds. Tolerating PO. He is stable for dc.   FINAL CLINICAL IMPRESSION(S) / ED DIAGNOSES   Final diagnoses:  Nausea and vomiting, unspecified vomiting type     Rx / DC Orders   ED Discharge Orders     None        Note:  This document was prepared using Dragon voice recognition software and may include unintentional dictation errors.   Georga Hacking, MD 08/01/21 2231

## 2021-08-01 NOTE — ED Notes (Signed)
Provider at bedside to evaluate pt.

## 2021-08-01 NOTE — ED Notes (Signed)
Upon arrival to room, found patient eating candy and peanuts without nausea or vomiting. Patient tolerated well. Patient asked if he could get a work note to cover him

## 2021-09-10 ENCOUNTER — Emergency Department
Admission: EM | Admit: 2021-09-10 | Discharge: 2021-09-10 | Disposition: A | Discharge status: Home / Self Care | Payer: Self-pay | Attending: Emergency Medicine | Admitting: Emergency Medicine

## 2021-09-10 ENCOUNTER — Other Ambulatory Visit: Payer: Self-pay

## 2021-09-10 DIAGNOSIS — R42 Dizziness and giddiness: Secondary | ICD-10-CM | POA: Insufficient documentation

## 2021-09-10 DIAGNOSIS — Z20822 Contact with and (suspected) exposure to covid-19: Secondary | ICD-10-CM | POA: Insufficient documentation

## 2021-09-10 DIAGNOSIS — R509 Fever, unspecified: Secondary | ICD-10-CM | POA: Insufficient documentation

## 2021-09-10 LAB — COMPREHENSIVE METABOLIC PANEL
ALT: 30 U/L (ref 0–44)
AST: 30 U/L (ref 15–41)
Albumin: 4.5 g/dL (ref 3.5–5.0)
Alkaline Phosphatase: 74 U/L (ref 38–126)
Anion gap: 9 (ref 5–15)
BUN: 19 mg/dL (ref 6–20)
CO2: 30 mmol/L (ref 22–32)
Calcium: 9.3 mg/dL (ref 8.9–10.3)
Chloride: 101 mmol/L (ref 98–111)
Creatinine, Ser: 1.02 mg/dL (ref 0.61–1.24)
GFR, Estimated: >60 mL/min (ref >60)
Glucose, Bld: 94 mg/dL (ref 70–99)
Potassium: 3.9 mmol/L (ref 3.5–5.1)
Sodium: 140 mmol/L (ref 135–145)
Total Bilirubin: 0.6 mg/dL (ref 0.3–1.2)
Total Protein: 8.2 g/dL — ABNORMAL HIGH (ref 6.5–8.1)

## 2021-09-10 LAB — CBC WITH DIFFERENTIAL/PLATELET
Abs Immature Granulocytes: 0.02 10*3/uL (ref 0.00–0.07)
Basophils Absolute: 0.1 10*3/uL (ref 0.0–0.1)
Basophils Relative: 1 %
Eosinophils Absolute: 0.4 10*3/uL (ref 0.0–0.5)
Eosinophils Relative: 5 %
HCT: 48.4 % (ref 39.0–52.0)
Hemoglobin: 15.7 g/dL (ref 13.0–17.0)
Immature Granulocytes: 0 %
Lymphocytes Relative: 29 %
Lymphs Abs: 2.2 10*3/uL (ref 0.7–4.0)
MCH: 27.3 pg (ref 26.0–34.0)
MCHC: 32.4 g/dL (ref 30.0–36.0)
MCV: 84.2 fL (ref 80.0–100.0)
Monocytes Absolute: 0.8 10*3/uL (ref 0.1–1.0)
Monocytes Relative: 10 %
Neutro Abs: 4.2 10*3/uL (ref 1.7–7.7)
Neutrophils Relative %: 55 %
Platelets: 310 10*3/uL (ref 150–400)
RBC: 5.75 MIL/uL (ref 4.22–5.81)
RDW: 12.8 % (ref 11.5–15.5)
WBC: 7.6 10*3/uL (ref 4.0–10.5)
nRBC: 0 % (ref 0.0–0.2)

## 2021-09-10 LAB — RESP PANEL BY RT-PCR (FLU A&B, COVID) ARPGX2
Influenza A by PCR: NEGATIVE
Influenza B by PCR: NEGATIVE
SARS Coronavirus 2 by RT PCR: NEGATIVE

## 2021-09-10 MED ORDER — FLUTICASONE PROPIONATE 50 MCG/ACT NA SUSP: 1.0000 | Freq: Two times a day (BID) | NASAL | 11 refills | Status: DC

## 2021-09-10 MED ORDER — MECLIZINE HCL 25 MG PO TABS: 25.0000 mg | ORAL_TABLET | Freq: Three times a day (TID) | ORAL | 1 refills | Status: DC | PRN

## 2021-09-10 MED ORDER — CETIRIZINE HCL 10 MG PO TABS: 10.0000 mg | ORAL_TABLET | Freq: Every day | ORAL | 11 refills | Status: DC

## 2021-09-10 NOTE — ED Notes (Signed)
E signature pad not working. Pt educated on discharge instructions and verbalized understanding.  

## 2021-09-10 NOTE — ED Triage Notes (Signed)
Pt c/o fever, HA and dizziness since yesterday. State he had a neg covid test. ?

## 2021-09-10 NOTE — ED Notes (Signed)
See triage note  presents with h/a,subjective fever and dizziness  sx's started yesterday afebrile on arrival ?

## 2021-09-10 NOTE — ED Provider Notes (Signed)
? ?[facility] ?Provider Note ? ?Patient Contact: 4:04 PM (approximate) ? ? ?History  ? ?Fever and Headache ? ? ?HPI ? ?Zachary Mendoza Montez Hageman. is a 31 y.o. male who presents the emergency department complaining of dizziness.  He states that this is episodic, does not seem to be aggravated or alleviated by any specific activity or time.  He states that it is periodic.  Patient states he has had a fever in the last 2 to 3 days, was also outside mowing the grass and had some allergic rhinitis symptoms.  He denies any headache, neck pain, chest pain, shortness of breath, abdominal pain, nausea, vomiting, diarrhea, urinary changes at this time.  States that he is concerned that it might be his blood pressure.  However he states that he also has a history of anxiety and that when he is not upset his blood pressure typically reads normal, its only when he is upset that it reads high.  He does state that sometimes he knows that his symptoms are derived from anxiety but is concerned that the dizziness appears even when he is not anxious. ?  ? ? ?Physical Exam  ? ?Triage Vital Signs: ?ED Triage Vitals [09/10/21 1432]  ?Enc Vitals Group  ?   BP (!) 140/96  ?   Pulse Rate 84  ?   Resp 18  ?   Temp 98 ?F (36.7 ?C)  ?   Temp Source Oral  ?   SpO2 94 %  ?   Weight 280 lb (127 kg)  ?   Height 5\' 9"  (1.753 m)  ?   Head Circumference   ?   Peak Flow   ?   Pain Score   ?   Pain Loc   ?   Pain Edu?   ?   Excl. in [place]?   ? ? ?Most recent vital signs: ?Vitals:  ? 09/10/21 1432  ?BP: (!) 140/96  ?Pulse: 84  ?Resp: 18  ?Temp: 98 ?F (36.7 ?C)  ?SpO2: 94%  ? ? ? ?General: Alert and in no acute distress. ?Eyes:  PERRL. EOMI. ?Head: No acute traumatic findings ?ENT: ?     Ears: EACs are unremarkable bilaterally.  TM has moderately bulging bilaterally with no injection. ?     Nose: No congestion/rhinnorhea.  Turbinates are slightly boggy. ?     Mouth/Throat: Mucous membranes are moist.  ?Cardiovascular:  Good peripheral  perfusion.  No appreciable murmurs, rubs, gallops ?Respiratory: Normal respiratory effort without tachypnea or retractions. Lungs CTAB. Good air entry to the bases with no decreased or absent breath sounds. ?Gastrointestinal: Bowel sounds ?4 quadrants. Soft and nontender to palpation. No guarding or rigidity. No palpable masses. No distention. No CVA tenderness. ?Musculoskeletal: Full range of motion to all extremities.  ?Neurologic:  No gross focal neurologic deficits are appreciated.  Cranial nerves II through XII grossly intact at this time. ?Skin:   No rash noted ?Other: ? ? ?ED Results / Procedures / Treatments  ? ?Labs ?(all labs ordered are listed, but only abnormal results are displayed) ?Labs Reviewed  ?COMPREHENSIVE METABOLIC PANEL - Abnormal; Notable for the following components:  ?    Result Value  ? Total Protein 8.2 (*)   ? All other components within normal limits  ?RESP PANEL BY RT-PCR (FLU A&B, COVID) [id number]  ?CBC WITH DIFFERENTIAL/PLATELET  ? ? ? ?EKG ? ? ? ? ?RADIOLOGY ? ? ? ?No results found. ? ?PROCEDURES: ? ?Critical Care performed: No ? ?  Procedures ? ? ?MEDICATIONS ORDERED IN ED: ?Medications - No data to display ? ? ?IMPRESSION / MDM / ASSESSMENT AND PLAN / ED COURSE  ?I reviewed the triage vital signs and the nursing notes. ?             ?               ? ?Differential diagnosis includes, but is not limited to, vertigo, allergic rhinitis, high blood pressure, anxiety ? ? ?Patient's diagnosis is consistent with primary complaint of dizziness.  Patient states that he has had symptoms off and on for the last 2 years.  He states that he has other symptoms that he typically attributes to anxiety but the dizziness has been persistent and episodic type pattern.  On exam patient has moderate bulging bilaterally with findings consistent with allergic rhinitis.  Otherwise his exam was reassuring.  Patient had basic labs as he states that he had a history of issues with his liver in the past and was  concerned regarding intermittent high blood pressure.  The blood pressure issue tends to appear when he is anxious or upset.  Given the reassuring blood pressure of 140/96 I do not think that the patient has severe hypertension as he was anxious today with a reading of 140 systolic.  I will not place the patient on antihypertensives at this time.  Labs are reassuring.  Patient will be placed on Flonase, Zyrtec and meclizine.  Follow-up with primary care as needed.  Return precautions discussed with the patient..  Patient is given ED precautions to return to the ED for any worsening or new symptoms. ? ? ? ?  ? ? ?FINAL CLINICAL IMPRESSION(S) / ED DIAGNOSES  ? ?Final diagnoses:  ?Dizziness  ? ? ? ?Rx / DC Orders  ? ?ED Discharge Orders   ? ?      Ordered  ?  fluticasone (FLONASE) 50 MCG/ACT nasal spray  2 times daily       ? 09/10/21 1957  ?  cetirizine (ZYRTEC) 10 MG tablet  Daily       ? 09/10/21 1957  ?  meclizine (ANTIVERT) 25 MG tablet  3 times daily PRN       ? 09/10/21 1957  ? ?  ?  ? ?  ? ? ? ?Note:  This document was prepared using Dragon voice recognition software and may include unintentional dictation errors. ?  ?Racheal Patches, PA-C ?09/10/21 1957 ? ?  ?Georga Hacking, MD ?09/11/21 0050 ? ?

## 2021-12-01 ENCOUNTER — Emergency Department
Admission: EM | Admit: 2021-12-01 | Discharge: 2021-12-01 | Disposition: A | Discharge status: Home / Self Care | Payer: Self-pay | Attending: Emergency Medicine | Admitting: Emergency Medicine

## 2021-12-01 ENCOUNTER — Other Ambulatory Visit: Payer: Self-pay

## 2021-12-01 ENCOUNTER — Emergency Department: Payer: Self-pay

## 2021-12-01 DIAGNOSIS — I10 Essential (primary) hypertension: Secondary | ICD-10-CM | POA: Insufficient documentation

## 2021-12-01 DIAGNOSIS — R519 Headache, unspecified: Secondary | ICD-10-CM | POA: Insufficient documentation

## 2021-12-01 MED ORDER — BUTALBITAL-APAP-CAFFEINE 50-325-40 MG PO TABS
2.0000 | ORAL_TABLET | Freq: Once | ORAL | Status: AC
  Administered 2021-12-01: 2 via ORAL
  Filled 2021-12-01: qty 2

## 2021-12-01 MED ORDER — BUTALBITAL-APAP-CAFFEINE 50-325-40 MG PO TABS: 1.0000 | ORAL_TABLET | Freq: Four times a day (QID) | ORAL | 0 refills | Status: AC | PRN

## 2021-12-01 NOTE — ED Triage Notes (Signed)
Pt presents to ER c/o HA x3 days that is unrelieved w/tylenol or motrin.  Pt states HA is peri-orbital and at the top of his head.  States he is having some photosensitivity.  States same thing happened a few weeks ago and was seen here.  Pt is A&O x4 at this time, and neurologically intact.

## 2021-12-01 NOTE — ED Provider Notes (Signed)
   [facility] Provider Note    Event Date/Time   First MD Initiated Contact with Patient 12/01/21 2025     (approximate)  History   Chief Complaint: Headache  HPI  Gregery L Camposano Montez Hageman. is a 31 y.o. male with a past medical history of anxiety, hypertension, presents to the emergency department for headache.  According to the patient approximately 2 weeks ago he developed a headache, and over the past 2 days he has had a headache once again.  Patient states no history of migraines or headaches previously.  No prior head imaging.  No weakness or numbness of any arm or leg.  Physical Exam   Triage Vital Signs: ED Triage Vitals  Enc Vitals Group     BP 12/01/21 1941 (!) 133/94     Pulse Rate 12/01/21 1941 67     Resp 12/01/21 1941 17     Temp 12/01/21 1941 98.4 F (36.9 C)     Temp Source 12/01/21 1941 Oral     SpO2 12/01/21 1941 99 %     Weight 12/01/21 1941 265 lb (120.2 kg)     Height 12/01/21 1941 5\' 9"  (1.753 m)     Head Circumference --      Peak Flow --      Pain Score 12/01/21 1945 6     Pain Loc --      Pain Edu? --      Excl. in [place]? --     Most recent vital signs: Vitals:   12/01/21 1941  BP: (!) 133/94  Pulse: 67  Resp: 17  Temp: 98.4 F (36.9 C)  SpO2: 99%    General: Awake, no distress.  CV:  Good peripheral perfusion.  Regular rate and rhythm  Resp:  Normal effort.  Equal breath sounds bilaterally.  Abd:  No distention.  Soft, nontender.  No rebound or guarding.    ED Results / Procedures / Treatments   RADIOLOGY  I have interpreted the CT images no significant abnormality or bleed seen on my evaluation.   MEDICATIONS ORDERED IN ED: Medications  butalbital-acetaminophen-caffeine (FIORICET) 50-325-40 MG per tablet 2 tablet (has no administration in time range)     IMPRESSION / MDM / ASSESSMENT AND PLAN / ED COURSE  I reviewed the triage vital signs and the nursing notes.  Patient's presentation is most consistent  with acute illness / injury with system symptoms.  Patient presents emergency department for headache intermittent over the past 2 weeks and more constant x2 days.  Patient has taken Tylenol and ibuprofen at home without relief so he came to the emergency department.  Patient's work-up is reassuring including a normal physical exam.  Equal grip strength, no pronator drift, no cranial nerve deficits.  Patient ambulates without any difficulty.  CT scan of the head is negative for any acute abnormality.  I offered to place an IV to treat with IV medications.  Patient states he does not want an IV we will dose Fioricet in the emergency department and discharged with the same.  Patient agreeable to plan of care.  FINAL CLINICAL IMPRESSION(S) / ED DIAGNOSES   Headache    Note:  This document was prepared using Dragon voice recognition software and may include unintentional dictation errors.   12/03/21, MD 12/01/21 2108

## 2021-12-29 ENCOUNTER — Encounter: Payer: Self-pay | Admitting: *Deleted

## 2021-12-29 ENCOUNTER — Other Ambulatory Visit: Payer: Self-pay

## 2021-12-29 ENCOUNTER — Emergency Department
Admission: EM | Admit: 2021-12-29 | Discharge: 2021-12-29 | Disposition: A | Discharge status: Home / Self Care | Payer: Self-pay | Attending: Emergency Medicine | Admitting: Emergency Medicine

## 2021-12-29 DIAGNOSIS — F17200 Nicotine dependence, unspecified, uncomplicated: Secondary | ICD-10-CM | POA: Insufficient documentation

## 2021-12-29 DIAGNOSIS — I10 Essential (primary) hypertension: Secondary | ICD-10-CM | POA: Insufficient documentation

## 2021-12-29 DIAGNOSIS — L2481 Irritant contact dermatitis due to metals: Secondary | ICD-10-CM | POA: Insufficient documentation

## 2021-12-29 MED ORDER — FLUORESCEIN SODIUM 1 MG OP STRP
1.0000 | ORAL_STRIP | Freq: Once | OPHTHALMIC | Status: AC
  Administered 2021-12-29: 1 via OPHTHALMIC
  Filled 2021-12-29: qty 1

## 2021-12-29 NOTE — Discharge Instructions (Addendum)
-  You may apply combination of hydrocortisone cream and bacitracin ointment to the  lesions on your arms and ear for inflammation and pain relief.  Do not apply anything to the eyes.  -You may take Tylenol/ibuprofen as needed for pain.  -Return to the emergency department anytime if you begin experiencing new or worsening symptoms

## 2021-12-29 NOTE — ED Provider Notes (Signed)
[facility] Provider Note    Event Date/Time   First MD Initiated Contact with Patient 12/29/21 2103     (approximate)   History   Chief Complaint Rash   HPI Mitul L Zachary Montez Hageman. is a 31 y.o. male, history of alcohol abuse, tobacco use, hypertension, presents the emergency department for evaluation of rash on the upper extremities and left ear, as well as itchiness in the eyes.  He states that he has been working around copper dust for the past 4 days and believes his skin and eyes are getting irritated from this.  This is occurring to other coworkers as well.  He states that he does not want any diagnostic work-up, but would like a work note.  Denies fever/chills, eye drainage, blurred vision, hearing changes, chest pain, shortness of breath, cough/congestion, chest pain, headache, dizziness/lightheadedness, nausea/vomiting, or diarrhea.  History Limitations: No limitations.        Physical Exam  Triage Vital Signs: ED Triage Vitals  Enc Vitals Group     BP 12/29/21 2005 132/82     Pulse Rate 12/29/21 2005 92     Resp 12/29/21 2005 20     Temp 12/29/21 2005 98.7 F (37.1 C)     Temp Source 12/29/21 2005 Oral     SpO2 12/29/21 2005 97 %     Weight 12/29/21 2004 269 lb (122 kg)     Height 12/29/21 2004 5\' 9"  (1.753 m)     Head Circumference --      Peak Flow --      Pain Score 12/29/21 2004 0     Pain Loc --      Pain Edu? --      Excl. in GC? --     Most recent vital signs: Vitals:   12/29/21 2005  BP: 132/82  Pulse: 92  Resp: 20  Temp: 98.7 F (37.1 C)  SpO2: 97%    General: Awake, NAD.  Skin: Warm, dry. No rashes or lesions.  Eyes: PERRL. Conjunctivae normal.  No obvious foreign bodies present. CV: Good peripheral perfusion.  Resp: Normal effort.  Lung sounds are clear bilaterally in the apices and bases. Abd: Soft, non-tender. No distention.  Neuro: At baseline. No gross neurological deficits.   Focused Exam: Scattered  maculopapular regions along the exposed parts of the upper extremities.  No significant warmth, erythema, or tenderness.  The tragus of the left ear appears mildly scabbed over and irritated.  Physical Exam    ED Results / Procedures / Treatments  Labs (all labs ordered are listed, but only abnormal results are displayed) Labs Reviewed - No data to display   EKG N/A   RADIOLOGY  ED Provider Interpretation: N/A.  No results found.  PROCEDURES:  Critical Care performed: N/A.  Procedures    MEDICATIONS ORDERED IN ED: Medications  fluorescein ophthalmic strip 1 strip (1 strip Both Eyes Given 12/29/21 2203)     IMPRESSION / MDM / ASSESSMENT AND PLAN / ED COURSE  I reviewed the triage vital signs and the nursing notes.                              Differential diagnosis includes, but is not limited to, contact dermatitis, allergic dermatitis, corneal abrasion, conjunctivitis.  Assessment/Plan Presentation consistent with contact dermatitis along the exposed areas of his body, including his upper extremities and ears.  Eyes do not look particularly erythematous or  infectious, though was unable to do a fluorescein examination due to patient refusal.  Advised him that I believe it would be in his best interest to rule out corneal abrasion/ulcers, however he states he does not believe that this is necessary and only wants a work note.  His symptoms appear mild at this time.  Encouraged him to utilize hydrocortisone/bacitracin on his upper extremities and left ear as needed for irritation and to avoid copper dust during his recovery period.  We will plan to discharge him with a work note.  Patient's presentation is most consistent with acute, uncomplicated illness.   Provided the patient with anticipatory guidance, return precautions, and educational material. Encouraged the patient to return to the emergency department at any time if they begin to experience any new or worsening  symptoms. Patient expressed understanding and agreed with the plan.       FINAL CLINICAL IMPRESSION(S) / ED DIAGNOSES   Final diagnoses:  Irritant contact dermatitis due to metals     Rx / DC Orders   ED Discharge Orders     None        Note:  This document was prepared using Dragon voice recognition software and may include unintentional dictation errors.   Varney Daily, [place] 12/29/21 2216    Dionne Bucy, MD 12/29/21 2318

## 2021-12-29 NOTE — ED Triage Notes (Signed)
Pt reports rash to left ear and both arms for 1 week.  Pt also reports itching around beard.  Pt also has eye drainage from both eyes.  No blurred vision.    Pt alert  speech clear.

## 2022-10-26 ENCOUNTER — Emergency Department
Admission: EM | Admit: 2022-10-26 | Discharge: 2022-10-26 | Disposition: A | Discharge status: Home / Self Care | Payer: Self-pay | Attending: Emergency Medicine | Admitting: Emergency Medicine

## 2022-10-26 ENCOUNTER — Other Ambulatory Visit: Payer: Self-pay

## 2022-10-26 DIAGNOSIS — K529 Noninfective gastroenteritis and colitis, unspecified: Secondary | ICD-10-CM | POA: Insufficient documentation

## 2022-10-26 LAB — COMPREHENSIVE METABOLIC PANEL
ALT: 26 U/L (ref 0–44)
AST: 24 U/L (ref 15–41)
Albumin: 4.1 g/dL (ref 3.5–5.0)
Alkaline Phosphatase: 58 U/L (ref 38–126)
Anion gap: 7 (ref 5–15)
BUN: 13 mg/dL (ref 6–20)
CO2: 28 mmol/L (ref 22–32)
Calcium: 9 mg/dL (ref 8.9–10.3)
Chloride: 102 mmol/L (ref 98–111)
Creatinine, Ser: 0.73 mg/dL (ref 0.61–1.24)
GFR, Estimated: >60 mL/min (ref >60)
Glucose, Bld: 122 mg/dL — ABNORMAL HIGH (ref 70–99)
Potassium: 4.3 mmol/L (ref 3.5–5.1)
Sodium: 137 mmol/L (ref 135–145)
Total Bilirubin: 0.5 mg/dL (ref 0.3–1.2)
Total Protein: 7.1 g/dL (ref 6.5–8.1)

## 2022-10-26 LAB — CBC
HCT: 44.1 % (ref 39.0–52.0)
Hemoglobin: 14.6 g/dL (ref 13.0–17.0)
MCH: 28.3 pg (ref 26.0–34.0)
MCHC: 33.1 g/dL (ref 30.0–36.0)
MCV: 85.6 fL (ref 80.0–100.0)
Platelets: 310 10*3/uL (ref 150–400)
RBC: 5.15 MIL/uL (ref 4.22–5.81)
RDW: 12.4 % (ref 11.5–15.5)
WBC: 6.3 10*3/uL (ref 4.0–10.5)
nRBC: 0 % (ref 0.0–0.2)

## 2022-10-26 LAB — LIPASE, BLOOD: Lipase: 39 U/L (ref 11–51)

## 2022-10-26 MED ORDER — ONDANSETRON 4 MG PO TBDP: 4.0000 mg | ORAL_TABLET | Freq: Three times a day (TID) | ORAL | 0 refills | Status: DC | PRN

## 2022-10-26 MED ORDER — BACITRACIN ZINC 500 UNIT/GM EX OINT
TOPICAL_OINTMENT | Freq: Once | CUTANEOUS | Status: AC
  Filled 2022-10-26: qty 0.9

## 2022-10-26 MED ORDER — ONDANSETRON 4 MG PO TBDP
4.0000 mg | ORAL_TABLET | Freq: Once | ORAL | Status: AC
  Administered 2022-10-26: 4 mg via ORAL
  Filled 2022-10-26: qty 1

## 2022-10-26 NOTE — Discharge Instructions (Signed)
Follow-up with your regular doctor if not improving 3 to 4 days.  Return emergency department worsening.  Take the Zofran ODT as needed for nausea and vomiting.  You may also take Imodium A-D if the diarrhea gets worse.

## 2022-10-26 NOTE — ED Provider Notes (Signed)
[facility] Provider Note    Event Date/Time   First MD Initiated Contact with Patient 10/26/22 1308     (approximate)   History   Emesis   HPI  Zachary L Katayama Montez Hageman. is a 32 y.o. male with no significant past medical history presents emergency department with vomiting for 2 days.  A few episodes of diarrhea.  States been keeping fluids and by eating Pedialyte popsicles.  States basically he would like some nausea medicine and a work note.  States he needs to go back to work tomorrow.  Patient denies that he has had fever, chills, abdominal pain, chest pain or shortness of breath.      Physical Exam   Triage Vital Signs: ED Triage Vitals  Enc Vitals Group     BP 10/26/22 1249 (!) 144/95     Pulse Rate 10/26/22 1249 80     Resp 10/26/22 1249 18     Temp 10/26/22 1249 98.4 F (36.9 C)     Temp Source 10/26/22 1249 Oral     SpO2 10/26/22 1249 96 %     Weight 10/26/22 1251 281 lb (127.5 kg)     Height 10/26/22 1251 5\' 11"  (1.803 m)     Head Circumference --      Peak Flow --      Pain Score 10/26/22 1251 0     Pain Loc --      Pain Edu? --      Excl. in GC? --     Most recent vital signs: Vitals:   10/26/22 1249  BP: (!) 144/95  Pulse: 80  Resp: 18  Temp: 98.4 F (36.9 C)  SpO2: 96%     General: Awake, no distress.   CV:  Good peripheral perfusion. regular rate and  rhythm Resp:  Normal effort. Lungs CTA Abd:  No distention.  Nontender, bowel sounds normal all 4 quadrants Other:      ED Results / Procedures / Treatments   Labs (all labs ordered are listed, but only abnormal results are displayed) Labs Reviewed  COMPREHENSIVE METABOLIC PANEL - Abnormal; Notable for the following components:      Result Value   Glucose, Bld 122 (*)    All other components within normal limits  LIPASE, BLOOD  CBC     EKG     RADIOLOGY     PROCEDURES:   Procedures   MEDICATIONS ORDERED IN ED: Medications  bacitracin ointment  (has no administration in time range)  ondansetron (ZOFRAN-ODT) disintegrating tablet 4 mg (4 mg Oral Given 10/26/22 1321)     IMPRESSION / MDM / ASSESSMENT AND PLAN / ED COURSE  I reviewed the triage vital signs and the nursing notes.                              Differential diagnosis includes, but is not limited to, acute gastroenteritis, acute cholecystitis, acute pancreatitis, viral illness  Patient's presentation is most consistent with acute illness / injury with system symptoms.   Patient appears to be well.  Do not feel that he needs IV medication.  Will give him a Zofran ODT here in the ED.  Awaiting labs.  Feel that he will be stable for discharge.   Labs are reassuring,  Patient appears to be well.  Do not feel we need to do further workup at this time.  He will be given a prescription for  Zofran ODT.  Discharged with a work note.  He is in agreement treatment plan.  Is stable at discharge.   FINAL CLINICAL IMPRESSION(S) / ED DIAGNOSES   Final diagnoses:  Acute gastroenteritis     Rx / DC Orders   ED Discharge Orders          Ordered    ondansetron (ZOFRAN-ODT) 4 MG disintegrating tablet  Every 8 hours PRN        10/26/22 1320             Note:  This document was prepared using Dragon voice recognition software and may include unintentional dictation errors.    Faythe Ghee, PA-C 10/26/22 1323    Jene Every, MD 10/26/22 [phone]

## 2022-10-26 NOTE — ED Triage Notes (Signed)
Pt to ED for emesis for 3 days. Denies fevers. NAD noted.

## 2022-12-03 ENCOUNTER — Emergency Department: Payer: Self-pay

## 2022-12-03 ENCOUNTER — Other Ambulatory Visit: Payer: Self-pay

## 2022-12-03 DIAGNOSIS — R0789 Other chest pain: Secondary | ICD-10-CM | POA: Insufficient documentation

## 2022-12-03 DIAGNOSIS — I10 Essential (primary) hypertension: Secondary | ICD-10-CM | POA: Insufficient documentation

## 2022-12-03 DIAGNOSIS — F17200 Nicotine dependence, unspecified, uncomplicated: Secondary | ICD-10-CM | POA: Insufficient documentation

## 2022-12-03 DIAGNOSIS — Z716 Tobacco abuse counseling: Secondary | ICD-10-CM | POA: Insufficient documentation

## 2022-12-03 DIAGNOSIS — G43909 Migraine, unspecified, not intractable, without status migrainosus: Secondary | ICD-10-CM | POA: Insufficient documentation

## 2022-12-03 LAB — BASIC METABOLIC PANEL
Anion gap: 7 (ref 5–15)
BUN: 18 mg/dL (ref 6–20)
CO2: 27 mmol/L (ref 22–32)
Calcium: 8.4 mg/dL — ABNORMAL LOW (ref 8.9–10.3)
Chloride: 102 mmol/L (ref 98–111)
Creatinine, Ser: 0.77 mg/dL (ref 0.61–1.24)
GFR, Estimated: >60 mL/min (ref >60)
Glucose, Bld: 122 mg/dL — ABNORMAL HIGH (ref 70–99)
Potassium: 3.9 mmol/L (ref 3.5–5.1)
Sodium: 136 mmol/L (ref 135–145)

## 2022-12-03 LAB — CBC
HCT: 42.3 % (ref 39.0–52.0)
Hemoglobin: 13.9 g/dL (ref 13.0–17.0)
MCH: 27.9 pg (ref 26.0–34.0)
MCHC: 32.9 g/dL (ref 30.0–36.0)
MCV: 84.8 fL (ref 80.0–100.0)
Platelets: 301 10*3/uL (ref 150–400)
RBC: 4.99 MIL/uL (ref 4.22–5.81)
RDW: 12.3 % (ref 11.5–15.5)
WBC: 6.3 10*3/uL (ref 4.0–10.5)
nRBC: 0 % (ref 0.0–0.2)

## 2022-12-03 LAB — TROPONIN I (HIGH SENSITIVITY)
Troponin I (High Sensitivity): 4 ng/L (ref <18)
Troponin I (High Sensitivity): 5 ng/L (ref <18)

## 2022-12-03 NOTE — ED Triage Notes (Signed)
Pt states at work he was walking outside at work and got tunnel vision and afterwards got a headache. Pt reports dizziness, headache, and CP today, worse w/ exertion. Pt states he's also been coughing up yellow sputum for several weeks.

## 2022-12-04 ENCOUNTER — Emergency Department
Admission: EM | Admit: 2022-12-04 | Discharge: 2022-12-04 | Disposition: A | Discharge status: Home / Self Care | Payer: Self-pay | Attending: Emergency Medicine | Admitting: Emergency Medicine

## 2022-12-04 ENCOUNTER — Emergency Department: Payer: Self-pay

## 2022-12-04 DIAGNOSIS — R0789 Other chest pain: Secondary | ICD-10-CM

## 2022-12-04 DIAGNOSIS — G43009 Migraine without aura, not intractable, without status migrainosus: Secondary | ICD-10-CM

## 2022-12-04 DIAGNOSIS — Z716 Tobacco abuse counseling: Secondary | ICD-10-CM

## 2022-12-04 MED ORDER — NICOTINE 7 MG/24HR TD PT24: 7.0000 mg | MEDICATED_PATCH | TRANSDERMAL | 11 refills | Status: DC

## 2022-12-04 MED ORDER — NICOTINE POLACRILEX 4 MG MT LOZG: 4.0000 mg | LOZENGE | OROMUCOSAL | 0 refills | Status: DC | PRN

## 2022-12-04 NOTE — ED Notes (Signed)
PT brought to ed rm 1 at this time, this RN now assuming care.  

## 2022-12-04 NOTE — Discharge Instructions (Addendum)
I made a referral to a primary doctor and cardiologist who will reach out to you to schedule a follow-up appointment.  Call Dr. Malvin Johns of neurology to discuss your headaches.  Your testing in the emergency department showed no signs of emergencies like heart attack or bleeding side of the brain today.  If you have any new, worsening, or unexpected symptoms call your doctor right away or come back to the emergency department for recheck.

## 2022-12-04 NOTE — ED Provider Notes (Signed)
[facility] Provider Note    Event Date/Time   First MD Initiated Contact with Patient 12/04/22 0258     (approximate)   History   Headache and Chest Pain   HPI  Zachary Mendoza. is a 32 y.o. male   Past medical history of hypertension and panic attacks who presents to the emergency department with Headache with associated visual aura sparkling colors in the left side of his visual field and tunnel vision, resolved without intervention.  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  No trauma.  No fever, neck stiffness or pain..  When he has a headache he feels anxious and has chest tightness.  Has had some exertional chest pain over the last several months.   Has had a mild cough over the last several days and is a daily smoker.  No fever.   External Medical Documents Reviewed: Emergency department visit dated May 2024 for vomiting      Physical Exam   Triage Vital Signs: ED Triage Vitals  Enc Vitals Group     BP 12/03/22 1911 (!) 147/105     Pulse Rate 12/03/22 1911 76     Resp 12/03/22 1911 18     Temp 12/03/22 1911 98.4 F (36.9 C)     Temp Source 12/03/22 1911 Oral     SpO2 12/03/22 1911 100 %     Weight --      Height --      Head Circumference --      Peak Flow --      Pain Score 12/03/22 1915 2     Pain Loc --      Pain Edu? --      Excl. in GC? --     Most recent vital signs: Vitals:   12/03/22 1911 12/04/22 0310  BP: (!) 147/105 138/80  Pulse: 76 70  Resp: 18 20  Temp: 98.4 F (36.9 C) 98.2 F (36.8 C)  SpO2: 100% 99%    General: Awake, no distress.  CV:  Good peripheral perfusion.  Resp:  Normal effort.  Abd:  No distention.  Other:  Wake alert comfortable appearing no acute distress with normal hemodynamics clear lungs soft nontender abdomen euvolemic appearing.  No focal neurologic process on exam including motor or sensory deficits, facial asymmetry.  No temporal tenderness.  Neck  supple full range of motion.   ED Results / Procedures / Treatments   Labs (all labs ordered are listed, but only abnormal results are displayed) Labs Reviewed  BASIC METABOLIC PANEL - Abnormal; Notable for the following components:      Result Value   Glucose, Bld 122 (*)    Calcium 8.4 (*)    All other components within normal limits  CBC  TROPONIN I (HIGH SENSITIVITY)  TROPONIN I (HIGH SENSITIVITY)     I ordered and reviewed the above labs they are notable for flat troponin x 2  EKG  ED ECG REPORT I, Pilar Jarvis, the attending physician, personally viewed and interpreted this ECG.   Date: 12/04/2022  EKG Time: 1914  Rate: 74  Rhythm: nsr  Axis: nl  Intervals:none  ST&T Change: no stemi    RADIOLOGY I independently reviewed and interpreted CT of the head and see no obvious bleeding or midline shift   PROCEDURES:  Critical Care performed: No  Procedures   MEDICATIONS ORDERED IN ED: Medications - No data to display  IMPRESSION / MDM / ASSESSMENT AND PLAN / ED COURSE  I reviewed the triage vital signs and the nursing notes.                                Patient's presentation is most consistent with acute presentation with potential threat to life or bodily function.  Differential diagnosis includes, but is not limited to, migraine headache with aura, ICH, stroke, ACS, dissection, PE, respiratory infection   The patient is on the cardiac monitor to evaluate for evidence of arrhythmia and/or significant heart rate changes.  MDM: Patient with migraine headache with aura resolved without intervention no focal neurologic deficits to suggest stroke, trauma to suggest intracranial bleeding, temporal tenderness to suggest vasculitis.  Chest pain associate with his headaches and anxiety would be very atypical for cardiac ischemia, nonischemic EKG and serial troponins have been negative so I doubt ACS.  Inconsistent with PE or dissection.  Has had a cough but  no fever, no leukocytosis and a clear chest x-ray doubt bacterial pneumonia.  Since the workup has been unremarkable and patient is asymptomatic now, I think he can be discharged at this time and will have follow-up with PMD, referral made because he does not have a PMD.  Also given the number for Dr. Malvin Johns neurology follow-up if he continues to have migraine headache.  Cardiology referral for his exertional chest pain with negative workup today.   I spent 5 minutes counseling this patient on smoking cessation.  We spoke about the patient's current tobacco use, impact of smoking, assessed willingness to quit, methods for cessation including medical management and nicotine replacement therapy (which I prescribed to the patient) and advised follow-up with primary doctor to continue to address smoking cessation.         FINAL CLINICAL IMPRESSION(S) / ED DIAGNOSES   Final diagnoses:  Migraine without aura and without status migrainosus, not intractable  Atypical chest pain  Encounter for smoking cessation counseling     Rx / DC Orders   ED Discharge Orders          Ordered    Ambulatory referral to Cardiology       Comments: If you have not heard from the Cardiology office within the next 72 hours please call [phone].   12/04/22 0357    Ambulatory Referral to Primary Care (Establish Care)        12/04/22 0357    nicotine (NICODERM CQ - DOSED IN MG/24 HR) 7 mg/24hr patch  Every 24 hours        12/04/22 0358    nicotine polacrilex (NICOTINE MINI) 4 MG lozenge  As needed        12/04/22 0358             Note:  This document was prepared using Dragon voice recognition software and may include unintentional dictation errors.    Pilar Jarvis, MD 12/04/22 [phone]

## 2022-12-04 NOTE — ED Notes (Signed)
ED Provider at bedside. 

## 2023-07-01 ENCOUNTER — Ambulatory Visit: Payer: Self-pay

## 2023-07-01 LAB — CBC AND DIFFERENTIAL
HCT: 43 (ref 41–53)
Hemoglobin: 14.5 (ref 13.5–17.5)
Neutrophils Absolute: 14.9
Platelets: 303 10*3/uL (ref 150–400)
WBC: 17.3

## 2023-07-01 LAB — BASIC METABOLIC PANEL
BUN: 21 (ref 4–21)
CO2: 30 — AB (ref 13–22)
Chloride: 100 (ref 99–108)
Creatinine: 1.1 (ref 0.6–1.3)
Glucose: 100
Potassium: 4.3 meq/L (ref 3.5–5.1)
Sodium: 141 (ref 137–147)

## 2023-07-01 LAB — HEPATIC FUNCTION PANEL
ALT: 31 U/L (ref 10–40)
AST: 25 (ref 14–40)
Alkaline Phosphatase: 78 (ref 25–125)
Bilirubin, Total: 0.3

## 2023-07-01 LAB — COMPREHENSIVE METABOLIC PANEL
Albumin: 3.9 (ref 3.5–5.0)
Calcium: 9.7 (ref 8.7–10.7)
eGFR: 87

## 2023-07-01 LAB — CBC: RBC: 5.07 (ref 3.87–5.11)

## 2023-07-01 NOTE — Telephone Encounter (Signed)
Message from [place] E sent at 07/01/2023 12:06 PM EST  Summary: Blurred vision/Lightheadedness   Pt called reporting blurry vision and lightheadedness Best contact: [phone]  BP: 146/99         Chief Complaint: lightheadedness, chest pain Symptoms: seeing floaters, cheeks flushed, dizziness, headache- stated the episodes come and go Frequency: stated 4 weeks but could be longer Pertinent Negatives: Patient denies speech problems, weakness or numbness to face arms or legs Disposition: [x] ED /[] Urgent Care (no appt availability in office) / [] Appointment(In office/virtual)/ []  [facility]/ [] Home Care/ [] Refused Recommended Disposition /[]  Mobile Bus/ []  Follow-up with PCP Additional Notes: pt has been taking garlique for his BP.   Reason for Disposition  [1] Blurred vision or visual changes AND [2] present now AND [3] sudden onset or new (e.g., minutes, hours, days)  (Exception: Seeing floaters / black specks OR previously diagnosed migraine headaches with same symptoms.)  [1] Systolic BP  >= 160 OR Diastolic >= 100 AND [2] cardiac (e.g., breathing difficulty, chest pain) or neurologic symptoms (e.g., new-onset blurred or double vision, unsteady gait)    Has h/o very high BP's. Sees ED for care and per chart review as far back as a year ago very high  Answer Assessment - Initial Assessment Questions 1. DESCRIPTION: "How has your vision changed?" (e.g., complete vision loss, blurred vision, double vision, floaters, etc.)     Floaters  2. LOCATION: "One or both eyes?" If one, ask: "Which eye?"     both 3. SEVERITY: "Can you see anything?" If Yes, ask: "What can you see?" (e.g., fine print)     yes 4. ONSET: "When did this begin?" "Did it start suddenly or has this been gradual?"     4 weeks  5. PATTERN: "Does this come and go, or has it been constant since it started?"     Comes and goes- 3-4 week - now lightheaded, ? Sinus headache/wants to lay 8. CAUSE:  "What do you think is causing this visual problem?"     Untreated HTN 9. OTHER SYMPTOMS: "Do you have any other symptoms?" (e.g., confusion, headache, arm or leg weakness, speech problems)     Top of cheeks red 146/99, left chest pain "pops chest and feels better  Answer Assessment - Initial Assessment Questions 1. BLOOD PRESSURE: "What is the blood pressure?" "Did you take at least two measurements 5 minutes apart?"     146 99 4. HISTORY: "Do you have a history of high blood pressure?"     Yes- untreated 5. MEDICINES: "Are you taking any medicines for blood pressure?" "Have you missed any doses recently?"     No meds 6. OTHER SYMPTOMS: "Do you have any symptoms?" (e.g., blurred vision, chest pain, difficulty breathing, headache, weakness)     Chest pain, floaters and lightheadedness and flushed cheeks, headache  Protocols used: Vision Loss or Change-A-AH, Blood Pressure - High-A-AH

## 2023-07-05 ENCOUNTER — Ambulatory Visit: Payer: Self-pay | Admitting: Family Medicine

## 2023-07-05 ENCOUNTER — Encounter: Payer: Self-pay | Admitting: Family Medicine

## 2023-07-05 VITALS — BP 139/93 | HR 87 | Ht 71.0 in | Wt 299.9 lb

## 2023-07-05 DIAGNOSIS — F321 Major depressive disorder, single episode, moderate: Secondary | ICD-10-CM

## 2023-07-05 DIAGNOSIS — F411 Generalized anxiety disorder: Secondary | ICD-10-CM

## 2023-07-05 DIAGNOSIS — F1110 Opioid abuse, uncomplicated: Secondary | ICD-10-CM

## 2023-07-05 DIAGNOSIS — N182 Chronic kidney disease, stage 2 (mild): Secondary | ICD-10-CM

## 2023-07-05 DIAGNOSIS — F902 Attention-deficit hyperactivity disorder, combined type: Secondary | ICD-10-CM

## 2023-07-05 DIAGNOSIS — M5440 Lumbago with sciatica, unspecified side: Secondary | ICD-10-CM

## 2023-07-05 DIAGNOSIS — I10 Essential (primary) hypertension: Secondary | ICD-10-CM

## 2023-07-05 DIAGNOSIS — Z8719 Personal history of other diseases of the digestive system: Secondary | ICD-10-CM | POA: Insufficient documentation

## 2023-07-05 DIAGNOSIS — F17200 Nicotine dependence, unspecified, uncomplicated: Secondary | ICD-10-CM

## 2023-07-05 DIAGNOSIS — Z7689 Persons encountering health services in other specified circumstances: Secondary | ICD-10-CM

## 2023-07-05 MED ORDER — ATOMOXETINE HCL 40 MG PO CAPS
40.0000 mg | ORAL_CAPSULE | Freq: Every day | ORAL | 0 refills | Status: DC
Start: 2023-07-05 — End: 2023-08-01

## 2023-07-05 MED ORDER — LISINOPRIL 10 MG PO TABS
10.0000 mg | ORAL_TABLET | Freq: Every day | ORAL | 0 refills | Status: DC
Start: 2023-07-05 — End: 2023-10-03

## 2023-07-05 NOTE — Progress Notes (Signed)
New patient visit   Patient: Zachary Hilyard Romanek Jr.   DOB: [birth year]   33 y.o. Male  MRN: [id number] Visit Date: 07/05/2023  Today's healthcare provider: [name], DO   Chief Complaint  Patient presents with   New Patient (Initial Visit)   Subjective    Zachary Anchors Vassell Montez Hageman. is a 33 y.o. male who presents today as a new patient to establish care.  HPI  The patient, with a history of ADHD and substance abuse, presents with concerns about persistently elevated blood pressure readings. He reports self-monitoring his blood pressure over the past few weeks, with readings consistently in the range of 146/90s. The patient also visited the ER due to these elevated readings.  The patient describes a high level of stress and agitation, particularly in noisy environments such as when dealing with his four children. He reports a history of impatience and quick temper, which he speculates may be contributing to his elevated blood pressure. He denies any recent panic attacks, attributing past episodes to alcohol-induced anxiety. Current stressors include financial worries due to being between jobs.  The patient has a history of alcohol and substance abuse but reports quitting alcohol three years ago (02/08/2020). He now only consumes half a white claw approximately once a month. He denies any current illicit drug use but admits to past sporadic use of methamphetamines and prescription drugs. He also admits to current use of oxycodone, obtained from non-medical sources, for chronic leg pain following a previous injury.  The patient reports visual disturbances in the form of floaters in both eyes for the past three years. He also describes previous episodes of lightheadedness and facial flushing, during which he feels unwell and needs to rest. He denies any associated headaches.  The patient also reports intermittent back pain, which he describes as a 'sandwiching' sensation that radiates down the left  leg. These episodes last for about a week and a half at a time. He also describes a chronic pain in his left leg following a previous injury, for which he self-medicates with the aforementioned oxycodone. He has also been experiencing discomfort behind his right knee, which feels as though it's "pulling apart" when he stretches his leg; he is concerned he's ripped a tendon but declines intervention at this time.  The patient has a history of ADHD diagnosed in childhood, previously treated with concerta. He reports a family history of substance abuse, with his mother dying from pill misuse and his father being a heavy drinker in his youth. He also reports a history of depression and anxiety, for which he was previously prescribed Lexapro and buspirone, but he was not compliant with these medications due to concurrent alcohol use.  On record review, his blood pressure was previously (briefly) addressed with:  Coreg 6.25 bid + lisinopril 10 02/2019-05/2019   Past Medical History:  Diagnosis Date   Acute alcoholic hepatitis 02/08/2020   Allergy    Depression    Just sometimes   GERD (gastroesophageal reflux disease)    Hypertension    Panic attacks    Past Surgical History:  Procedure Laterality Date   ESOPHAGOGASTRODUODENOSCOPY (EGD) WITH PROPOFOL N/A 10/04/2019   Procedure: ESOPHAGOGASTRODUODENOSCOPY (EGD) WITH PROPOFOL;  Surgeon: Midge Minium, MD;  Location: [facility] ENDOSCOPY;  Service: Endoscopy;  Laterality: N/A;   FRACTURE SURGERY Right 2022   Metal ankle   Family Status  Relation Name Status   Mother Zachary Mendoza (Not Specified)  No partnership data on file  Family History  Problem Relation Age of Onset   Hypertension Mother    Social History   Socioeconomic History   Marital status: Single    Spouse name: Not on file   Number of children: Not on file   Years of education: Not on file   Highest education level: 9th grade  Occupational History   Not on file  Tobacco Use    Smoking status: Every Day    Current packs/day: 0.50    Average packs/day: 0.5 packs/day for 15.0 years (7.5 ttl pk-yrs)    Types: Cigarettes, E-cigarettes   Smokeless tobacco: Never  Vaping Use   Vaping status: Every Day  Substance and Sexual Activity   Alcohol use: Not Currently    Comment: about week ago   Drug use: Not Currently    Types: Methamphetamines   Sexual activity: Yes    Birth control/protection: None    Comment: Same girl 15 years straight  Other Topics Concern   Not on file  Social History Narrative   Not on file   Social Drivers of Health   Financial Resource Strain: Medium Risk (07/04/2023)   Overall Financial Resource Strain (CARDIA)    Difficulty of Paying Living Expenses: Somewhat hard  Food Insecurity: No Food Insecurity (07/04/2023)   Hunger Vital Sign    Worried About Running Out of Food in the Last Year: Never true    Ran Out of Food in the Last Year: Never true  Transportation Needs: No Transportation Needs (07/04/2023)   PRAPARE - Administrator, Civil Service (Medical): No    Lack of Transportation (Non-Medical): No  Physical Activity: Insufficiently Active (07/04/2023)   Exercise Vital Sign    Days of Exercise per Week: 2 days    Minutes of Exercise per Session: 60 min  Stress: No Stress Concern Present (07/04/2023)   [employer] of Occupational Health - Occupational Stress Questionnaire    Feeling of Stress : Only a little  Social Connections: Moderately Isolated (07/04/2023)   Social Connection and Isolation Panel [NHANES]    Frequency of Communication with Friends and Family: Three times a week    Frequency of Social Gatherings with Friends and Family: Never    Attends Religious Services: Never    Database administrator or Organizations: No    Attends Engineer, structural: Not on file    Marital Status: Living with partner   Outpatient Medications Prior to Visit  Medication Sig   [DISCONTINUED] acetaminophen  (TYLENOL) 500 MG tablet Take 500 mg by mouth every 6 (six) hours as needed. (Patient not taking: Reported on 07/05/2023)   [DISCONTINUED] cetirizine (ZYRTEC) 10 MG tablet Take 1 tablet (10 mg total) by mouth daily. (Patient not taking: Reported on 07/05/2023)   [DISCONTINUED] fluticasone (FLONASE) 50 MCG/ACT nasal spray Place 1 spray into both nostrils 2 (two) times daily.   [DISCONTINUED] meclizine (ANTIVERT) 25 MG tablet Take 1 tablet (25 mg total) by mouth 3 (three) times daily as needed for dizziness.   [DISCONTINUED] nicotine (NICODERM CQ - DOSED IN MG/24 HR) 7 mg/24hr patch Place 1 patch (7 mg total) onto the skin daily.   [DISCONTINUED] nicotine polacrilex (NICOTINE MINI) 4 MG lozenge Take 1 lozenge (4 mg total) by mouth as needed.   [DISCONTINUED] ondansetron (ZOFRAN-ODT) 4 MG disintegrating tablet Take 1 tablet (4 mg total) by mouth every 8 (eight) hours as needed.   [DISCONTINUED] oxyCODONE-acetaminophen (PERCOCET) 5-325 MG tablet Take 1-2 tablets by mouth every 4 (four)  hours as needed for severe pain. (Patient not taking: Reported on 07/05/2023)   [DISCONTINUED] oxyCODONE-acetaminophen (PERCOCET/ROXICET) 5-325 MG tablet Take 2 tablets by mouth every 4 (four) hours as needed for severe pain. (Patient not taking: Reported on 07/05/2023)   No facility-administered medications prior to visit.   Allergies  Allergen Reactions   Penicillins Hives    Pt states he is not allergic, but that his sister was.    Immunization History  Administered Date(s) Administered   Hep B, Unspecified 03/25/2000, 04/29/2000, 10/21/2000   Influenza,inj,Quad PF,6+ Mos 02/28/2019   Influenza-Unspecified 03/15/2019   Pneumococcal Polysaccharide-23 02/28/2019   Tdap 11/13/2015    Health Maintenance  Topic Date Due   INFLUENZA VACCINE  09/12/2023 (Originally 01/13/2023)   COVID-19 Vaccine (1 - 2024-25 season) 02/13/2024 (Originally 02/13/2023)   Pneumococcal Vaccine 3-29 Years old (2 of 2 - PCV) 07/04/2024  (Originally 02/28/2020)   DTaP/Tdap/Td (2 - Td or Tdap) 11/12/2025   Hepatitis C Screening  Completed   HIV Screening  Completed   HPV VACCINES  Aged Out    Patient Care Team: Nathifa Ritthaler, Monico Blitz, DO as PCP - General (Family Medicine)       Objective    BP (!) 139/93 (BP Location: Left Arm, Patient Position: Sitting, Cuff Size: Large)   Pulse 87   Ht 5\' 11"  (1.803 m)   Wt 299 lb 14.4 oz (136 kg)   SpO2 98%   BMI 41.83 kg/m     Physical Exam Vitals and nursing note reviewed.  Constitutional:      General: He is not in acute distress.    Appearance: Normal appearance.  HENT:     Head: Normocephalic and atraumatic.  Eyes:     General: No scleral icterus.    Conjunctiva/sclera: Conjunctivae normal.  Cardiovascular:     Rate and Rhythm: Normal rate.  Pulmonary:     Effort: Pulmonary effort is normal.  Neurological:     Mental Status: He is alert and oriented to person, place, and time. Mental status is at baseline.  Psychiatric:        Mood and Affect: Mood normal.        Speech: Speech is rapid and pressured.        Behavior: Behavior normal.     Depression Screen    07/05/2023    2:06 PM  PHQ 2/9 Scores  PHQ - 2 Score 4  PHQ- 9 Score 14   Results for orders placed or performed in visit on 07/05/23  Lipid panel  Result Value Ref Range   Cholesterol, Total 263 (H) 100 - 199 mg/dL   Triglycerides 696 (H) 0 - 149 mg/dL   HDL 36 (L) >29 mg/dL   VLDL Cholesterol Cal 82 (H) 5 - 40 mg/dL   LDL Chol Calc (NIH) 528 (H) 0 - 99 mg/dL   Chol/HDL Ratio 7.3 (H) 0.0 - 5.0 ratio    Assessment & Plan     Primary hypertension Assessment & Plan: Consistently elevated blood pressure readings around 146/96 to 146/99 over the past two weeks, with a current reading of 139/93. Chronic elevation is concerning for long-term complications. Discussed that lisinopril is a low-dose medication with minimal side effects, primarily used to manage blood pressure and protect kidney  function. - Prescribe lisinopril 10 mg once daily - Follow up in 3-4 weeks to recheck blood pressure  Orders: -     Lisinopril; Take 1 tablet (10 mg total) by mouth daily.  Dispense: 90 tablet; Refill:  0 -     Lipid panel  Establishing care with new doctor, encounter for  ADHD (attention deficit hyperactivity disorder), combined type Assessment & Plan: Symptoms include irritability, impatience, and difficulty focusing, possibly contributing to elevated blood pressure. Discussed atomoxetine (Strattera) as a non-SSRI option for mood stabilization and ADHD symptoms without sedation. Explained potential side effects, including increased agitation. - Prescribe atomoxetine (Strattera) at a low dose - Follow up in 3-4 weeks to assess response to medication  Orders: -     Atomoxetine HCl; Take 1 capsule (40 mg total) by mouth daily.  Dispense: 30 capsule; Refill: 0  Chronic kidney disease, stage 2, mildly decreased GFR Assessment & Plan: Noted. Optimize cholesterol, blood pressure and diet/exercise habits to reduce risk of progression.  Orders: -     Lisinopril; Take 1 tablet (10 mg total) by mouth daily.  Dispense: 90 tablet; Refill: 0 -     Lipid panel  Depression, major, single episode, moderate (HCC) Assessment & Plan: PHQ-9 score 14 today. Discussed medication and counseling options, but patient maintains he is neither anxious nor depressed and prefers to focus on physical ailments and ADHD concerns.  - starting strattera as noted, which may also help with these symptoms.   GAD (generalized anxiety disorder) Assessment & Plan: GAD-7 score 15 today. Symptoms include irritability, impatience, and difficulty focusing, possibly contributing to elevated blood pressure. Previous limited trial of Lexapro. Discussed medication and counseling options, but patient maintains he is neither anxious nor depressed and prefers to focus on physical ailments and ADHD concerns.  - starting strattera  as noted, which may also help with these symptoms.   Mild opioid use disorder [facility]) Assessment & Plan: Current use of oxycodone 40 mg daily for chronic leg pain without prescription. Discussed risks of non-prescribed opioid use, including kidney damage and respiratory depression/arrest. - Discuss risks of non-prescribed opioid use - Recommend patient avoid using any medications not prescribed for him. - Consider referral to pain management at follow up visit.   Back pain of lumbar region with sciatica Assessment & Plan: Intermittent left leg pain radiating from the lower back, consistent with sciatica. Exacerbated by physical activity. Reports of a popping sensation in the knee. Discussed need for orthopedic evaluation and potential further imaging. - Refer to orthopedics for further evaluation of knee and back pain; patient declined at this time due to lack of insurance coverage. Consider referral at follow up visit as patient may be changing jobs and getting coverage. - Consider x-ray if symptoms persist or worsen   Nicotine dependence with current use Assessment & Plan: Current tobacco use; pt pre-contemplative regarding quitting.   History of alcoholic hepatitis Assessment & Plan: Noted.  Patient reports minimal alcohol use at this time.  Continue to monitor.   General Health Maintenance Not up to date with vaccinations and screenings. Last tetanus vaccine in 2017. No recent cholesterol panel. No interest in COVID-19 or flu vaccines. Discussed importance of vaccinations and regular cholesterol screening. Encouraged healthy eating habits. - Order cholesterol panel - Recommend staying up to date with tetanus vaccine - Encourage healthy eating habits, including increased fruit and vegetable intake   Return in about 4 weeks (around 08/02/2023) for Anx/Dep, HTN.     I discussed the assessment and treatment plan with the patient  The patient was provided an opportunity to ask  questions and all were answered. The patient agreed with the plan and demonstrated an understanding of the instructions.   The patient was advised to  call back or seek an in-person evaluation if the symptoms worsen or if the condition fails to improve as anticipated.    [name], DO  [facility] [phone] (phone) [phone] (fax)  [facility]

## 2023-07-05 NOTE — Patient Instructions (Addendum)
Use Good [facility] to get better prescriptions prices    Check your blood pressure twice weekly, and any time you have concerning symptoms like headache, chest pain, dizziness, shortness of breath, or vision changes.   Our goal is less than 130/80.  To appropriately check your blood pressure, make sure you do the following:  1) Avoid caffeine, exercise, or tobacco products for 30 minutes before checking. Empty your bladder. 2) Sit with your back supported in a flat-backed chair. Rest your arm on something flat (arm of the chair, table, etc). 3) Sit still with your feet flat on the floor, resting, for at least 5 minutes.  4) Check your blood pressure. Take 1-2 readings.  5) Write down these readings and bring with you to any provider appointments.  Bring your home blood pressure machine with you to a provider's office for accuracy comparison at least once a year.   Make sure you take your blood pressure medications before you come to any office visit, even if you were asked to fast for labs.

## 2023-07-06 ENCOUNTER — Encounter: Payer: Self-pay | Admitting: Family Medicine

## 2023-07-06 DIAGNOSIS — Z5971 Insufficient health insurance coverage: Secondary | ICD-10-CM

## 2023-07-06 DIAGNOSIS — F411 Generalized anxiety disorder: Secondary | ICD-10-CM

## 2023-07-06 DIAGNOSIS — F321 Major depressive disorder, single episode, moderate: Secondary | ICD-10-CM

## 2023-07-06 DIAGNOSIS — E782 Mixed hyperlipidemia: Secondary | ICD-10-CM

## 2023-07-06 LAB — LIPID PANEL
Chol/HDL Ratio: 7.3 {ratio} — ABNORMAL HIGH (ref 0.0–5.0)
Cholesterol, Total: 263 mg/dL — ABNORMAL HIGH (ref 100–199)
HDL: 36 mg/dL — ABNORMAL LOW (ref >39)
LDL Chol Calc (NIH): 145 mg/dL — ABNORMAL HIGH (ref 0–99)
Triglycerides: 437 mg/dL — ABNORMAL HIGH (ref 0–149)
VLDL Cholesterol Cal: 82 mg/dL — ABNORMAL HIGH (ref 5–40)

## 2023-07-07 MED ORDER — ROSUVASTATIN CALCIUM 10 MG PO TABS
10.0000 mg | ORAL_TABLET | Freq: Every day | ORAL | 3 refills | Status: DC
Start: 2023-07-07 — End: 2023-07-14

## 2023-07-10 ENCOUNTER — Encounter: Payer: Self-pay | Admitting: Family Medicine

## 2023-07-10 DIAGNOSIS — F321 Major depressive disorder, single episode, moderate: Secondary | ICD-10-CM | POA: Insufficient documentation

## 2023-07-10 DIAGNOSIS — F1110 Opioid abuse, uncomplicated: Secondary | ICD-10-CM | POA: Insufficient documentation

## 2023-07-10 DIAGNOSIS — M5440 Lumbago with sciatica, unspecified side: Secondary | ICD-10-CM | POA: Insufficient documentation

## 2023-07-10 NOTE — Assessment & Plan Note (Signed)
Current tobacco use; pt pre-contemplative regarding quitting.

## 2023-07-10 NOTE — Assessment & Plan Note (Addendum)
Noted.  Patient reports minimal alcohol use at this time.  Continue to monitor.

## 2023-07-10 NOTE — Assessment & Plan Note (Addendum)
GAD-7 score 15 today. Symptoms include irritability, impatience, and difficulty focusing, possibly contributing to elevated blood pressure. Previous limited trial of Lexapro. Discussed medication and counseling options, but patient maintains he is neither anxious nor depressed and prefers to focus on physical ailments and ADHD concerns.  - starting strattera as noted, which may also help with these symptoms.

## 2023-07-10 NOTE — Assessment & Plan Note (Signed)
Consistently elevated blood pressure readings around 146/96 to 146/99 over the past two weeks, with a current reading of 139/93. Chronic elevation is concerning for long-term complications. Discussed that lisinopril is a low-dose medication with minimal side effects, primarily used to manage blood pressure and protect kidney function. - Prescribe lisinopril 10 mg once daily - Follow up in 3-4 weeks to recheck blood pressure

## 2023-07-10 NOTE — Assessment & Plan Note (Addendum)
Current use of oxycodone 40 mg daily for chronic leg pain without prescription. Discussed risks of non-prescribed opioid use, including kidney damage and respiratory depression/arrest. - Discuss risks of non-prescribed opioid use - Recommend patient avoid using any medications not prescribed for him. - Consider referral to pain management at follow up visit.

## 2023-07-10 NOTE — Assessment & Plan Note (Addendum)
Intermittent left leg pain radiating from the lower back, consistent with sciatica. Exacerbated by physical activity. Reports of a popping sensation in the knee. Discussed need for orthopedic evaluation and potential further imaging. - Refer to orthopedics for further evaluation of knee and back pain; patient declined at this time due to lack of insurance coverage. Consider referral at follow up visit as patient may be changing jobs and getting coverage. - Consider x-ray if symptoms persist or worsen

## 2023-07-10 NOTE — Assessment & Plan Note (Signed)
PHQ-9 score 14 today. Discussed medication and counseling options, but patient maintains he is neither anxious nor depressed and prefers to focus on physical ailments and ADHD concerns.  - starting strattera as noted, which may also help with these symptoms.

## 2023-07-10 NOTE — Assessment & Plan Note (Signed)
Symptoms include irritability, impatience, and difficulty focusing, possibly contributing to elevated blood pressure. Discussed atomoxetine (Strattera) as a non-SSRI option for mood stabilization and ADHD symptoms without sedation. Explained potential side effects, including increased agitation. - Prescribe atomoxetine (Strattera) at a low dose - Follow up in 3-4 weeks to assess response to medication

## 2023-07-10 NOTE — Assessment & Plan Note (Signed)
Noted. Optimize cholesterol, blood pressure and diet/exercise habits to reduce risk of progression.

## 2023-07-13 ENCOUNTER — Encounter: Payer: Self-pay | Admitting: Family Medicine

## 2023-07-14 ENCOUNTER — Other Ambulatory Visit: Payer: Self-pay

## 2023-07-14 DIAGNOSIS — E782 Mixed hyperlipidemia: Secondary | ICD-10-CM

## 2023-07-14 MED ORDER — ROSUVASTATIN CALCIUM 10 MG PO TABS: 10.0000 mg | ORAL_TABLET | Freq: Every day | ORAL | 3 refills | Status: DC

## 2023-08-01 MED ORDER — ESCITALOPRAM OXALATE 10 MG PO TABS
10.0000 mg | ORAL_TABLET | Freq: Every day | ORAL | 1 refills | Status: DC
Start: 2023-08-01 — End: 2023-10-13

## 2023-08-02 ENCOUNTER — Ambulatory Visit: Payer: Self-pay | Admitting: Family Medicine

## 2023-08-17 ENCOUNTER — Telehealth: Payer: Self-pay | Admitting: *Deleted

## 2023-08-17 NOTE — Progress Notes (Unsigned)
 Complex Care Management Note Care Guide Note  08/17/2023 Name: Zachary Skeels Horsford Jr. MRN: [id number] DOB: 07/30/1990   Complex Care Management Outreach Attempts: An unsuccessful telephone outreach was attempted today to offer the patient information about available complex care management services.  Follow Up Plan:  Additional outreach attempts will be made to offer the patient complex care management information and services.   Encounter Outcome:  No Answer  Burman Nieves, CMA, Care Guide [facility]  [facility], [facility] Guide Direct Dial: [phone]  Fax: [phone] Website: [email or web address]

## 2023-08-18 NOTE — Progress Notes (Signed)
 Complex Care Management Note  Care Guide Note 08/18/2023 Name: Keilon Ressel Clewis Jr. MRN: [id number] DOB: Jun 26, 1990  Ivan Anchors [place] Montez Hageman. is a 33 y.o. year old male who sees Pardue, Monico Blitz, DO for primary care. I reached out to [employer]. by phone today to offer complex care management services.  Mr. Gentile was given information about Complex Care Management services today including:   The Complex Care Management services include support from the care team which includes your Nurse Care Manager, Clinical Social Worker, or Pharmacist.  The Complex Care Management team is here to help remove barriers to the health concerns and goals most important to you. Complex Care Management services are voluntary, and the patient may decline or stop services at any time by request to their care team member.   Complex Care Management Consent Status: Patient agreed to services and verbal consent obtained.   Follow up plan:  Telephone appointment with complex care management team member scheduled for:  08/29/2023  Encounter Outcome:  Patient Scheduled  Burman Nieves, CMA, Care Guide [facility]  [facility], [facility] Guide Direct Dial: [phone]  Fax: [phone] Website: [email or web address]

## 2023-08-29 ENCOUNTER — Telehealth: Payer: Self-pay | Admitting: *Deleted

## 2023-08-29 ENCOUNTER — Ambulatory Visit: Payer: Self-pay | Admitting: *Deleted

## 2023-08-29 NOTE — Patient Outreach (Signed)
 Care Coordination   08/29/2023 Name: Zachary Stoklosa Loos Jr. MRN: [id number] DOB: Jun 26, 1990   Care Coordination Outreach Attempts:  An unsuccessful telephone outreach was attempted today to offer the patient information about available complex care management services.  Follow Up Plan:  Additional outreach attempts will be made to offer the patient complex care management information and services.   Encounter Outcome:  No Answer   Care Coordination Interventions:  No, not indicated    Zerek Litsey, LCSW [place]  [facility], [facility] Licensed Clinical Social Worker Care Coordinator  Direct Dial: [phone]

## 2023-08-29 NOTE — Patient Outreach (Signed)
 Return phone call from patient who confirmed having no community resource needs at this time. Per patient, he is now working for [employer] and will be covered under their insurance in 3 months. Patient identified no additional needs at this time and declined completion of an assessment.  Patient given this social worker's contact number, patient encouraged to contact this social worker with any additional needs that may arise in the future.   Chaska Hagger, LCSW [place]  [facility], [facility] Licensed Clinical Social Worker Care Coordinator  Direct Dial: [phone]

## 2023-09-30 ENCOUNTER — Other Ambulatory Visit: Payer: Self-pay | Admitting: Family Medicine

## 2023-09-30 DIAGNOSIS — N182 Chronic kidney disease, stage 2 (mild): Secondary | ICD-10-CM

## 2023-09-30 DIAGNOSIS — I10 Essential (primary) hypertension: Secondary | ICD-10-CM

## 2023-09-30 NOTE — Telephone Encounter (Signed)
 Copied from CRM [id number]. Topic: Clinical - Medication Refill >> Sep 30, 2023  1:51 PM Baldomero Bone wrote: Most Recent Primary Care Visit:  Provider: Carlean Charter  Department: [id number]  Visit Type: NEW PATIENT  Date: 07/05/2023  Medication: lisinopril  (ZESTRIL ) 10 MG tablet  Has the patient contacted their pharmacy? No (Agent: If no, request that the patient contact the pharmacy for the refill. If patient does not wish to contact the pharmacy document the reason why and proceed with request.) (Agent: If yes, when and what did the pharmacy advise?)  Is this the correct pharmacy for this prescription? Yes If no, delete pharmacy and type the correct one.  This is the patient's preferred pharmacy:  [id number] - [place],  - [address] AT [facility] OF [address] [place] [address] [place] [address] Phone: [phone] Fax: [phone]   Has the prescription been filled recently? No  Is the patient out of the medication? Yes  Has the patient been seen for an appointment in the last year OR does the patient have an upcoming appointment? Yes  Can we respond through MyChart? Yes  Agent: Please be advised that Rx refills may take up to 3 business days. We ask that you follow-up with your pharmacy.

## 2023-09-30 NOTE — Telephone Encounter (Signed)
 Requested medications are due for refill today.  yes  Requested medications are on the active medications list.  yes  Last refill. 07/05/2023 #90 0 rf  Future visit scheduled.   yes  Notes to clinic.  This is a new medication to the pt.     Requested Prescriptions  Pending Prescriptions Disp Refills   lisinopril  (ZESTRIL ) 10 MG tablet 90 tablet 0    Sig: Take 1 tablet (10 mg total) by mouth daily.     Cardiovascular:  ACE Inhibitors Failed - 09/30/2023  4:30 PM      Failed - Last BP in normal range    BP Readings from Last 1 Encounters:  07/05/23 (!) 139/93         Failed - Valid encounter within last 6 months    Recent Outpatient Visits   None            Passed - Cr in normal range and within 180 days    Creatinine  Date Value Ref Range Status  07/01/2023 1.1 0.6 - 1.3 Final  06/05/2014 1.23 0.60 - 1.30 mg/dL Final   Creatinine, Ser  Date Value Ref Range Status  12/03/2022 0.77 0.61 - 1.24 mg/dL Final         Passed - K in normal range and within 180 days    Potassium  Date Value Ref Range Status  07/01/2023 4.3 3.5 - 5.1 mEq/L Final  06/05/2014 3.8 3.5 - 5.1 mmol/L Final         Passed - Patient is not pregnant

## 2023-10-01 ENCOUNTER — Other Ambulatory Visit: Payer: Self-pay | Admitting: Family Medicine

## 2023-10-01 DIAGNOSIS — I10 Essential (primary) hypertension: Secondary | ICD-10-CM

## 2023-10-01 DIAGNOSIS — N182 Chronic kidney disease, stage 2 (mild): Secondary | ICD-10-CM

## 2023-10-03 ENCOUNTER — Other Ambulatory Visit: Payer: Self-pay | Admitting: Family Medicine

## 2023-10-03 DIAGNOSIS — F321 Major depressive disorder, single episode, moderate: Secondary | ICD-10-CM

## 2023-10-03 DIAGNOSIS — F411 Generalized anxiety disorder: Secondary | ICD-10-CM

## 2023-10-03 NOTE — Telephone Encounter (Signed)
 Requested Prescriptions  Pending Prescriptions Disp Refills   lisinopril  (ZESTRIL ) 10 MG tablet [Pharmacy Med Name: LISINOPRIL  10MG  TABLETS] 90 tablet 0    Sig: TAKE 1 TABLET(10 MG) BY MOUTH DAILY     Cardiovascular:  ACE Inhibitors Failed - 10/03/2023 10:01 AM      Failed - Last BP in normal range    BP Readings from Last 1 Encounters:  07/05/23 (!) 139/93         Failed - Valid encounter within last 6 months    Recent Outpatient Visits   None            Passed - Cr in normal range and within 180 days    Creatinine  Date Value Ref Range Status  07/01/2023 1.1 0.6 - 1.3 Final  06/05/2014 1.23 0.60 - 1.30 mg/dL Final   Creatinine, Ser  Date Value Ref Range Status  12/03/2022 0.77 0.61 - 1.24 mg/dL Final         Passed - K in normal range and within 180 days    Potassium  Date Value Ref Range Status  07/01/2023 4.3 3.5 - 5.1 mEq/L Final  06/05/2014 3.8 3.5 - 5.1 mmol/L Final         Passed - Patient is not pregnant

## 2023-10-10 ENCOUNTER — Other Ambulatory Visit: Payer: Self-pay | Admitting: Family Medicine

## 2023-10-10 DIAGNOSIS — N182 Chronic kidney disease, stage 2 (mild): Secondary | ICD-10-CM

## 2023-10-10 DIAGNOSIS — I10 Essential (primary) hypertension: Secondary | ICD-10-CM

## 2023-10-11 ENCOUNTER — Encounter: Payer: Self-pay | Admitting: Family Medicine

## 2023-10-11 NOTE — Telephone Encounter (Signed)
 Prescription was sent in 10/03/2023 qty:90 and was confirmed by pharmacy.

## 2023-10-12 ENCOUNTER — Ambulatory Visit: Payer: Self-pay | Admitting: *Deleted

## 2023-10-12 ENCOUNTER — Other Ambulatory Visit: Payer: Self-pay | Admitting: Family Medicine

## 2023-10-12 DIAGNOSIS — F321 Major depressive disorder, single episode, moderate: Secondary | ICD-10-CM

## 2023-10-12 DIAGNOSIS — F411 Generalized anxiety disorder: Secondary | ICD-10-CM

## 2023-10-12 NOTE — Telephone Encounter (Signed)
  Chief Complaint: blurred vision Symptoms: Patient complains of blurred vision- sensation of flashing in eyes, headache, flushing of face today- vision did clear. Patient thinks caused by being off Lexapro  for 3 days. Patient was sent home from work- will have to have note to return. First available appointment scheduled for tomorrow at 2pm Frequency: started today at work  Disposition: [] ED /[] Urgent Care (no appt availability in office) / [x] Appointment(In office/virtual)/ []  [facility]/ [] Home Care/ [] Refused Recommended Disposition /[] [facility]/ []  Follow-up with PCP Additional Notes: Patient advised UC/ED if increased symptoms. Note sent to PCP for review     Copied from CRM [id number]. Topic: Clinical - Medical Advice >> Oct 12, 2023  8:27 AM Rosaria Common wrote: Reason for CRM: Pt stated that he has been without his Lexapro  for 3 days. This morning he began to experience blurry vision while at work and it was deemed unsafe for him to work so now his job his asking for medical clearance before returning to work. Pt is currently having severe migraine symptoms and headed home to rest. Would like medical advice, as well as a medical note for employer. Reason for Disposition  [1] Brief (now gone) blurred vision AND [2] unexplained  Answer Assessment - Initial Assessment Questions 1. DESCRIPTION: "How has your vision changed?" (e.g., complete vision loss, blurred vision, double vision, floaters, etc.)     Head pain, flushing, vision- flashing on R, then got dark 2. LOCATION: "One or both eyes?" If one, ask: "Which eye?"     Both eyes 3. SEVERITY: "Can you see anything?" If Yes, ask: "What can you see?" (e.g., fine print)     Vision has returned to normal, flushed 4. ONSET: "When did this begin?" "Did it start suddenly or has this been gradual?"     This morning 5. PATTERN: "Does this come and go, or has it been constant since it started?"    Comes and goes 6. PAIN: "Is  there any pain in your eye(s)?"  (Scale 1-10; or mild, moderate, severe)   - NONE (0): No pain.   - MILD (1-3): Doesn't interfere with normal activities.   - MODERATE (4-7): Interferes with normal activities or awakens from sleep.    - SEVERE (8-10): Excruciating pain, unable to do any normal activities.     Head pain- forehead- left side 7. CONTACTS-GLASSES: "Do you wear contacts or glasses?"     no 8. CAUSE: "What do you think is causing this visual problem?"     No Lexapro  for 3 days  9. OTHER SYMPTOMS: "Do you have any other symptoms?" (e.g., confusion, headache, arm or leg weakness, speech problems)     Headache  Protocols used: Vision Loss or Change-A-AH

## 2023-10-13 ENCOUNTER — Ambulatory Visit: Payer: Self-pay | Admitting: Family Medicine

## 2023-10-13 ENCOUNTER — Encounter: Payer: Self-pay | Admitting: Family Medicine

## 2023-10-13 VITALS — BP 138/86 | HR 73 | Ht 71.0 in | Wt 281.0 lb

## 2023-10-13 DIAGNOSIS — F321 Major depressive disorder, single episode, moderate: Secondary | ICD-10-CM

## 2023-10-13 DIAGNOSIS — F411 Generalized anxiety disorder: Secondary | ICD-10-CM

## 2023-10-13 MED ORDER — ESCITALOPRAM OXALATE 10 MG PO TABS: 10.0000 mg | ORAL_TABLET | Freq: Every day | ORAL | 1 refills | Status: DC

## 2023-10-13 MED ORDER — ESCITALOPRAM OXALATE 10 MG PO TABS: 15.0000 mg | ORAL_TABLET | Freq: Every day | ORAL | 0 refills | Status: DC

## 2023-10-13 NOTE — Progress Notes (Unsigned)
 ACUTE PATIENT VISIT    Patient: Zachary Balsbaugh Pen Jr.   DOB: [birth year]   33 y.o. Male  MRN: [id number] Visit Date: 10/13/2023  Today's healthcare provider: Mimi Alt, MD   PCP: Carlean Charter, DO   Chief Complaint  Patient presents with   Medication Reaction    He was out of his Lexapro  he was experiencing blurred vision and headache because he was out for 3 days, the pharmacy gave him an emergency fill he took it and started to feel better      Subjective     HPI     Medication Reaction    Additional comments: He was out of his Lexapro  he was experiencing blurred vision and headache because he was out for 3 days, the pharmacy gave him an emergency fill he took it and started to feel better        Last edited by Bart Lieu, CMA on 10/13/2023  2:14 PM.       Discussed the use of AI scribe software for clinical note transcription with the patient, who gave verbal consent to proceed.  History of Present Illness          Discussed the use of AI scribe software for clinical note transcription with the patient, who gave verbal consent to proceed.  History of Present Illness Zachary L Cadiente Marieta Shorten. is a 33 year old male who presents with headaches and blurry vision after missing Lexapro  doses.  He experienced headaches and blurry vision after missing his Lexapro  doses for three days. The symptoms began with a migraine and visual disturbances during a morning meeting at work, leading to him being sent home. He has since restarted Lexapro  at 10 mg daily.  He reports increased anxiety and depression following the loss of his son in February, significantly impacting his emotional well-being. He feels 'instantly depressed' at home and unmotivated, especially when the house is untidy. Working helps him feel better, but being at home exacerbates his symptoms. He finds Lexapro  helps with mood regulation, reducing anger and emotional volatility, and managing  interactions with his children more calmly. He describes the medication as making it 'impossible to cry'.  He has a history of alcohol use following his mother's death but has been sober since August 2021. He occasionally consumes a single alcoholic beverage but reports no desire to drink regularly. He is cautious about mixing alcohol with his medications and has no current urges to drink.  No suicidal ideation, but he feels unmotivated and tired.  Lexapro  was sent in this AM by Dr. Athena Bland      Past Medical History:  Diagnosis Date   Acute alcoholic hepatitis 02/08/2020   Allergy    Depression    Just sometimes   GERD (gastroesophageal reflux disease)    Hypertension    Panic attacks     Medications: Outpatient Medications Prior to Visit  Medication Sig   escitalopram  (LEXAPRO ) 10 MG tablet Take 1 tablet (10 mg total) by mouth daily. Need Appointment   lisinopril  (ZESTRIL ) 10 MG tablet TAKE 1 TABLET(10 MG) BY MOUTH DAILY   rosuvastatin  (CRESTOR ) 10 MG tablet Take 1 tablet (10 mg total) by mouth daily.   No facility-administered medications prior to visit.    Review of Systems  Last metabolic panel Lab Results  Component Value Date   GLUCOSE 122 (H) 12/03/2022   NA 141 07/01/2023   K 4.3 07/01/2023   CL 100 07/01/2023   CO2 30 (  A) 07/01/2023   BUN 21 07/01/2023   CREATININE 1.1 07/01/2023   EGFR 87 07/01/2023   CALCIUM  9.7 07/01/2023   PHOS 4.5 02/08/2020   PROT 7.1 10/26/2022   ALBUMIN 3.9 07/01/2023   BILITOT 0.5 10/26/2022   ALKPHOS 78 07/01/2023   AST 25 07/01/2023   ALT 31 07/01/2023   ANIONGAP 7 12/03/2022     {See past labs  Heme  Chem  Endocrine  Serology  Results Review (optional):1}   Objective    BP 138/86   Pulse 73   Ht 5\' 11"  (1.803 m)   Wt 281 lb (127.5 kg)   SpO2 99%   BMI 39.19 kg/m  BP Readings from Last 3 Encounters:  10/13/23 138/86  07/05/23 (!) 139/93  12/04/22 129/71   Wt Readings from Last 3 Encounters:  10/13/23 281  lb (127.5 kg)  07/05/23 299 lb 14.4 oz (136 kg)  10/26/22 281 lb (127.5 kg)         Physical Exam Constitutional:      General: He is not in acute distress.    Appearance: Normal appearance. He is not ill-appearing.  Pulmonary:     Effort: Pulmonary effort is normal. No respiratory distress.  Neurological:     Mental Status: He is alert and oriented to person, place, and time.  Psychiatric:        Attention and Perception: Attention normal.        Mood and Affect: Mood is depressed.        Speech: Speech normal.        Behavior: Behavior normal. Behavior is cooperative.        Thought Content: Thought content normal. Thought content is not paranoid.       No results found for any visits on 10/13/23.  Assessment & Plan     Problem List Items Addressed This Visit       Other   GAD (generalized anxiety disorder)   Relevant Medications   escitalopram  (LEXAPRO ) 10 MG tablet   Depression, major, single episode, moderate (HCC) - Primary   Relevant Medications   escitalopram  (LEXAPRO ) 10 MG tablet    Assessment & Plan Depression Depression exacerbated by recent loss of a child, with increased anxiety, lack of motivation, and mood swings. Current treatment with Lexapro  10 mg daily shows improvement, but significant emotional distress persists, particularly related to grief. Declined buspirone  due to past adverse reaction, preferring Lexapro  adjustment. - Increase Lexapro  to 15 mg daily, taking 1.5 tablets as tolerated. - Consider divided doses of Lexapro  for improved tolerability. - Reassess mood and symptoms in follow-up with Dr. Athena Bland in one month.  Migraine Migraine episodes potentially triggered by abrupt Lexapro  discontinuation, presenting with visual disturbances and severe headache, resolved with Lexapro  resumption. - Ensure consistent daily Lexapro  intake to prevent withdrawal symptoms and associated migraines.  Alcohol use disorder Alcohol use disorder with  significant past liver damage. Abstinent for nearly three years with no cravings or urges. Liver function normalized. Cautious about mixing medications with alcohol.       No follow-ups on file.         Mimi Alt, MD  [facility] [phone] (phone) [phone] (fax)  [facility]

## 2023-10-13 NOTE — Telephone Encounter (Signed)
 Patient has appointment today and medicine was sent in this morning.

## 2023-10-15 NOTE — Assessment & Plan Note (Signed)
 Depression, chronic Depression exacerbated by recent loss of a child, with increased anxiety, lack of motivation, and mood swings. Current treatment with Lexapro  10 mg daily shows improvement, but significant emotional distress persists, particularly related to grief. Declined buspirone  due to past adverse reaction, preferring Lexapro  adjustment. - Increase Lexapro  to 15 mg daily, taking 1.5 tablets as tolerated. - Consider divided doses of Lexapro  for improved tolerability. - Reassess mood and symptoms in follow-up with Dr. Athena Bland in one month.

## 2023-11-12 ENCOUNTER — Other Ambulatory Visit: Payer: Self-pay | Admitting: Family Medicine

## 2023-11-12 DIAGNOSIS — F411 Generalized anxiety disorder: Secondary | ICD-10-CM

## 2023-11-12 DIAGNOSIS — F321 Major depressive disorder, single episode, moderate: Secondary | ICD-10-CM

## 2023-11-15 ENCOUNTER — Ambulatory Visit: Payer: Self-pay | Admitting: Family Medicine

## 2023-12-09 ENCOUNTER — Ambulatory Visit: Payer: Self-pay | Admitting: Family Medicine

## 2024-01-12 ENCOUNTER — Other Ambulatory Visit: Payer: Self-pay | Admitting: Family Medicine

## 2024-01-12 DIAGNOSIS — I10 Essential (primary) hypertension: Secondary | ICD-10-CM

## 2024-01-12 DIAGNOSIS — N182 Chronic kidney disease, stage 2 (mild): Secondary | ICD-10-CM

## 2024-01-16 ENCOUNTER — Ambulatory Visit: Payer: Self-pay | Admitting: Family Medicine

## 2024-01-30 ENCOUNTER — Ambulatory Visit: Payer: Self-pay | Admitting: Family Medicine

## 2024-01-31 ENCOUNTER — Other Ambulatory Visit: Payer: Self-pay | Admitting: Family Medicine

## 2024-01-31 DIAGNOSIS — F321 Major depressive disorder, single episode, moderate: Secondary | ICD-10-CM

## 2024-01-31 DIAGNOSIS — F411 Generalized anxiety disorder: Secondary | ICD-10-CM

## 2024-04-10 ENCOUNTER — Other Ambulatory Visit: Payer: Self-pay | Admitting: Family Medicine

## 2024-04-10 DIAGNOSIS — I10 Essential (primary) hypertension: Secondary | ICD-10-CM

## 2024-04-10 DIAGNOSIS — N182 Chronic kidney disease, stage 2 (mild): Secondary | ICD-10-CM

## 2024-07-07 ENCOUNTER — Other Ambulatory Visit: Payer: Self-pay | Admitting: Family Medicine

## 2024-07-07 DIAGNOSIS — N182 Chronic kidney disease, stage 2 (mild): Secondary | ICD-10-CM

## 2024-07-07 DIAGNOSIS — I10 Essential (primary) hypertension: Secondary | ICD-10-CM

## 2024-07-08 ENCOUNTER — Other Ambulatory Visit: Payer: Self-pay | Admitting: Family Medicine

## 2024-07-08 DIAGNOSIS — E782 Mixed hyperlipidemia: Secondary | ICD-10-CM

## 2024-07-09 NOTE — Telephone Encounter (Signed)
 Requested by interface surescripts. Overdue encounter. Courtesy refill. Called patient to schedule appt no answer, VM box full. Unable to leave message. Patient will need appt for future refills.  Requested Prescriptions  Pending Prescriptions Disp Refills   lisinopril  (ZESTRIL ) 10 MG tablet [Pharmacy Med Name: LISINOPRIL  10MG  TABLETS] 30 tablet 0    Sig: TAKE 1 TABLET(10 MG) BY MOUTH DAILY     Cardiovascular:  ACE Inhibitors Failed - 07/09/2024  9:46 AM      Failed - Cr in normal range and within 180 days    Creatinine  Date Value Ref Range Status  07/01/2023 1.1 0.6 - 1.3 Final  06/05/2014 1.23 0.60 - 1.30 mg/dL Final   Creatinine, Ser  Date Value Ref Range Status  12/03/2022 0.77 0.61 - 1.24 mg/dL Final         Failed - K in normal range and within 180 days    Potassium  Date Value Ref Range Status  07/01/2023 4.3 3.5 - 5.1 mEq/L Final  06/05/2014 3.8 3.5 - 5.1 mmol/L Final         Failed - Valid encounter within last 6 months    Recent Outpatient Visits           9 months ago Depression, major, single episode, moderate (HCC)   [facility] [place], [place], MD              Passed - Patient is not pregnant      Passed - Last BP in normal range    BP Readings from Last 1 Encounters:  10/13/23 138/86

## 2024-07-09 NOTE — Telephone Encounter (Signed)
 Requested medication (s) are due for refill today - yes  Requested medication (s) are on the active medication list -yes  Future visit scheduled -no  Last refill: 07/14/23 #90 3RF  Notes to clinic: fails lab protocol- over 1 year- 07/01/23  Requested Prescriptions  Pending Prescriptions Disp Refills   rosuvastatin  (CRESTOR ) 10 MG tablet [Pharmacy Med Name: ROSUVASTATIN  10MG  TABLETS] 90 tablet 3    Sig: TAKE 1 TABLET(10 MG) BY MOUTH DAILY     Cardiovascular:  Antilipid - Statins 2 Failed - 07/09/2024  2:07 PM      Failed - Cr in normal range and within 360 days    Creatinine  Date Value Ref Range Status  07/01/2023 1.1 0.6 - 1.3 Final  06/05/2014 1.23 0.60 - 1.30 mg/dL Final   Creatinine, Ser  Date Value Ref Range Status  12/03/2022 0.77 0.61 - 1.24 mg/dL Final         Failed - Valid encounter within last 12 months    Recent Outpatient Visits           9 months ago Depression, major, single episode, moderate (HCC)   [facility], [place], [place]              Failed - Lipid Panel in normal range within the last 12 months    Cholesterol, Total  Date Value Ref Range Status  07/05/2023 263 (H) 100 - 199 mg/dL Final   LDL Chol Calc (NIH)  Date Value Ref Range Status  07/05/2023 145 (H) 0 - 99 mg/dL Final   Direct LDL  Date Value Ref Range Status  03/29/2019 UNABLE TO CALCULATE IF TRIGLYCERIDE IS >1293 mg/dL 0 - 99 mg/dL Final    Comment:    Performed at [facility], [address] [place], [place] [address]   HDL  Date Value Ref Range Status  07/05/2023 36 (L) >39 mg/dL Final   Triglycerides  Date Value Ref Range Status  07/05/2023 437 (H) 0 - 149 mg/dL Final         Passed - Patient is not pregnant         Requested Prescriptions  Pending Prescriptions Disp Refills   rosuvastatin  (CRESTOR ) 10 MG tablet [Pharmacy Med Name: ROSUVASTATIN  10MG  TABLETS] 90 tablet 3    Sig: TAKE 1 TABLET(10 MG) BY MOUTH  DAILY     Cardiovascular:  Antilipid - Statins 2 Failed - 07/09/2024  2:07 PM      Failed - Cr in normal range and within 360 days    Creatinine  Date Value Ref Range Status  07/01/2023 1.1 0.6 - 1.3 Final  06/05/2014 1.23 0.60 - 1.30 mg/dL Final   Creatinine, Ser  Date Value Ref Range Status  12/03/2022 0.77 0.61 - 1.24 mg/dL Final         Failed - Valid encounter within last 12 months    Recent Outpatient Visits           9 months ago Depression, major, single episode, moderate (HCC)   [facility], [place], MD              Failed - Lipid Panel in normal range within the last 12 months    Cholesterol, Total  Date Value Ref Range Status  07/05/2023 263 (H) 100 - 199 mg/dL Final   LDL Chol Calc (NIH)  Date Value Ref Range Status  07/05/2023 145 (H) 0 - 99 mg/dL Final   Direct LDL  Date Value Ref Range Status  03/29/2019 UNABLE TO CALCULATE IF TRIGLYCERIDE IS >1293 mg/dL 0 - 99 mg/dL Final    Comment:    Performed at [facility], [address] [place], [place] [address]   HDL  Date Value Ref Range Status  07/05/2023 36 (L) >39 mg/dL Final   Triglycerides  Date Value Ref Range Status  07/05/2023 437 (H) 0 - 149 mg/dL Final         Passed - Patient is not pregnant

## 2024-07-09 NOTE — Telephone Encounter (Signed)
 Called patient to schedule appt for medication refills. No answer, unable to leave message, VM box full.

## 2024-07-13 ENCOUNTER — Other Ambulatory Visit: Payer: Self-pay | Admitting: Family Medicine

## 2024-07-13 DIAGNOSIS — F411 Generalized anxiety disorder: Secondary | ICD-10-CM

## 2024-07-13 DIAGNOSIS — F321 Major depressive disorder, single episode, moderate: Secondary | ICD-10-CM
